# Patient Record
Sex: Female | Born: 1995
Health system: Southern US, Community
[De-identification: ages and names within clinical notes are randomized; demographics above are authoritative.]

## PROBLEM LIST (undated history)

## (undated) DIAGNOSIS — R05 Cough: Secondary | ICD-10-CM

## (undated) DIAGNOSIS — N63 Unspecified lump in unspecified breast: Secondary | ICD-10-CM

## (undated) DIAGNOSIS — G4700 Insomnia, unspecified: Secondary | ICD-10-CM

## (undated) DIAGNOSIS — R0981 Nasal congestion: Secondary | ICD-10-CM

## (undated) DIAGNOSIS — C8330 Diffuse large B-cell lymphoma, unspecified site: Principal | ICD-10-CM

## (undated) DIAGNOSIS — J029 Acute pharyngitis, unspecified: Secondary | ICD-10-CM

## (undated) HISTORY — PX: NO PAST SURGERIES: SHX2092

## (undated) HISTORY — DX: Diffuse large B-cell lymphoma, unspecified site: C83.30

## (undated) HISTORY — DX: Insomnia, unspecified: G47.00

---

## 2014-08-14 DIAGNOSIS — R059 Cough, unspecified: Secondary | ICD-10-CM

## 2014-08-14 DIAGNOSIS — N63 Unspecified lump in unspecified breast: Secondary | ICD-10-CM

## 2014-08-14 DIAGNOSIS — R0981 Nasal congestion: Secondary | ICD-10-CM

## 2014-08-14 HISTORY — DX: Unspecified lump in unspecified breast: N63.0

## 2014-08-14 HISTORY — DX: Cough, unspecified: R05.9

## 2014-08-14 HISTORY — DX: Nasal congestion: R09.81

## 2014-08-15 ENCOUNTER — Other Ambulatory Visit (HOSPITAL_COMMUNITY)
Admission: RE | Admit: 2014-08-15 | Discharge: 2014-08-15 | Disposition: A | Discharge status: Home / Self Care | Payer: Medicaid Other | Source: Ambulatory Visit | Attending: Hematology and Oncology | Admitting: Hematology and Oncology

## 2014-08-15 ENCOUNTER — Ambulatory Visit: Payer: Medicaid Other

## 2014-08-15 ENCOUNTER — Encounter: Payer: Self-pay | Admitting: Hematology and Oncology

## 2014-08-15 ENCOUNTER — Ambulatory Visit (HOSPITAL_BASED_OUTPATIENT_CLINIC_OR_DEPARTMENT_OTHER): Payer: Medicaid Other | Admitting: Hematology and Oncology

## 2014-08-15 VITALS — BP 133/68 | HR 71 | Temp 98.1°F | Resp 18 | Ht 65.0 in | Wt 205.1 lb

## 2014-08-15 DIAGNOSIS — C8299 Follicular lymphoma, unspecified, extranodal and solid organ sites: Secondary | ICD-10-CM

## 2014-08-15 DIAGNOSIS — C8590 Non-Hodgkin lymphoma, unspecified, unspecified site: Secondary | ICD-10-CM

## 2014-08-15 DIAGNOSIS — C8589 Other specified types of non-Hodgkin lymphoma, extranodal and solid organ sites: Secondary | ICD-10-CM | POA: Insufficient documentation

## 2014-08-15 LAB — CBC WITH DIFFERENTIAL/PLATELET
BASO%: 0.9 % (ref 0.0–2.0)
Basophils Absolute: 0.1 10*3/uL (ref 0.0–0.1)
EOS ABS: 0.1 10*3/uL (ref 0.0–0.5)
EOS%: 0.6 % (ref 0.0–7.0)
HCT: 35.4 % (ref 34.8–46.6)
HGB: 11.3 g/dL — ABNORMAL LOW (ref 11.6–15.9)
LYMPH%: 18.9 % (ref 14.0–49.7)
MCH: 26.7 pg (ref 25.1–34.0)
MCHC: 32 g/dL (ref 31.5–36.0)
MCV: 83.4 fL (ref 79.5–101.0)
MONO#: 0.3 10*3/uL (ref 0.1–0.9)
MONO%: 3.5 % (ref 0.0–14.0)
NEUT%: 76.1 % (ref 38.4–76.8)
NEUTROS ABS: 7.6 10*3/uL — AB (ref 1.5–6.5)
Platelets: 421 10*3/uL — ABNORMAL HIGH (ref 145–400)
RBC: 4.24 10*6/uL (ref 3.70–5.45)
RDW: 15.4 % — AB (ref 11.2–14.5)
WBC: 9.9 10*3/uL (ref 3.9–10.3)
lymph#: 1.9 10*3/uL (ref 0.9–3.3)

## 2014-08-15 LAB — COMPREHENSIVE METABOLIC PANEL (CC13)
ALT: 9 U/L (ref 0–55)
AST: 22 U/L (ref 5–34)
Albumin: 3.7 g/dL (ref 3.5–5.0)
Alkaline Phosphatase: 69 U/L (ref 40–150)
Anion Gap: 10 mEq/L (ref 3–11)
BUN: 7.4 mg/dL (ref 7.0–26.0)
CHLORIDE: 106 meq/L (ref 98–109)
CO2: 27 meq/L (ref 22–29)
Calcium: 10 mg/dL (ref 8.4–10.4)
Creatinine: 0.8 mg/dL (ref 0.6–1.1)
GLUCOSE: 77 mg/dL (ref 70–140)
POTASSIUM: 3.9 meq/L (ref 3.5–5.1)
SODIUM: 142 meq/L (ref 136–145)
Total Bilirubin: 0.29 mg/dL (ref 0.20–1.20)
Total Protein: 7 g/dL (ref 6.4–8.3)

## 2014-08-15 LAB — LACTATE DEHYDROGENASE (CC13): LDH: 606 U/L — ABNORMAL HIGH (ref 125–245)

## 2014-08-15 NOTE — Progress Notes (Signed)
Spoke w/ new pt with no insurance.  I gave pt a Medicaid and financial assistance application to fill out.  I informed pt of the [employer] and what they assist with and what is needed to apply for the grant.  Pt has my card if she wants to apply for funds and for any questions or concerns.

## 2014-08-15 NOTE — Progress Notes (Signed)
Appt made for consultation with Dr Excell Seltzer Tues Oct 6 at 230.  Pt give d/t, address and phone no.

## 2014-08-15 NOTE — Progress Notes (Signed)
MD created note from ofc visit - copy to pt, original to scan.

## 2014-08-15 NOTE — Assessment & Plan Note (Addendum)
B-cell lymphoproliferative disease: Primary pathology is suggesting that this may be a lymphoma or lymphoblastic process. Patient is completely asymptomatic and there is no other area of palpable lymphadenopathy exam. There is no hepatosplenomegaly as well. I discussed the report with pathology and they're recommending that we obtain additional tissue for further stains including flow cytometry.  I discussed with her what lymphoma means as well as the different subtypes of lymphoma but until we diagnosed her with definitive diagnosis of lymphoma I cannot discuss any treatment plans because they do vary depending on the type of lymphoma. Even though the pathology report appears to be alarming for an aggressive lymphoma, patient is completely asymptomatic.  I will call Gen. surgery to see if a biopsy can be obtained for additional testing and stains. I will obtain a PET/CT scan to evaluate if there is any other evidence of abnormality. If it does come back as lymphoma we will need a bone marrow biopsy to complete staging. Today I would obtain CBC CMP LDH SPEP hepatitis B and C. and HIV testing. 

## 2014-08-15 NOTE — Addendum Note (Signed)
Addended by: Prentiss Bells on: 08/15/2014 02:38 PM   Modules accepted: Orders

## 2014-08-15 NOTE — Progress Notes (Signed)
[facility] NOTE  Patient Care Team: Thurman Coyer, MD as PCP - General (Internal Medicine)  CHIEF COMPLAINTS/PURPOSE OF CONSULTATION:  Bilateral breast masses  HISTORY OF PRESENTING ILLNESS:  Lindsey Irwin 18 y.o. female is here because of recent diagnosis of bilateral breast masses. Patient had multiple bilateral breast masses since one year started in the left breast but over the past 6 months for the right breast gradually increasing in size mammogram done 07/30/2014 revealed multiple masses in both breasts could be juvenile fibroadenomas, pylloides tumor or malignancy. Ultrasound on 07/30/2014 revealed multiple note dated masses in the right breast and fine 15.9 cm, multiple lobate or masses left breasts. Medial quadrant 4.6 cm combined dimension as well as father located masses measuring 3.2 cm. Biopsy under ultrasound revealed high grade lymphoproliferative B-cell neoplasm but pathology is requesting additional tissue for stains,flow cytometry evaluations.  She is here accompanied by her friend. 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 She denies any pain in the breasts. She has lost 40 pounds intentionally over the past 4 years. She denies any fevers chills night sweats or any other areas of lymph node enlargements. She reports her family is from [place] and she is here by herself.  I reviewed her records extensively and collaborated the history with the patient.  MEDICAL HISTORY:  No past medical history on file.  SURGICAL HISTORY: No past surgical history on file.  SOCIAL HISTORY: History   Social History  . Marital Status: Unknown    Spouse Name: N/A    Number of Children: N/A  . Years of Education: N/A   Occupational History  . Not on file.   Social History Main Topics  . Smoking status: Not on file  . Smokeless tobacco: Not on file  . Alcohol Use: Not on file  . Drug Use: Not on file  . Sexual Activity: Not on  file   Other Topics Concern  . Not on file   Social History Narrative  . No narrative on file    FAMILY HISTORY: No family history on file.  ALLERGIES:  has no allergies on file.  MEDICATIONS:  No current outpatient prescriptions on file.   No current facility-administered medications for this visit.    REVIEW OF SYSTEMS:   Constitutional: Denies fevers, chills or abnormal night sweats Eyes: Denies blurriness of vision, double vision or watery eyes Ears, nose, mouth, throat, and face: Denies mucositis or sore throat Respiratory: Denies cough, dyspnea or wheezes Cardiovascular: Denies palpitation, chest discomfort or lower extremity swelling Gastrointestinal:  Denies nausea, heartburn or change in bowel habits Skin: Denies abnormal skin rashes Lymphatics: Denies new lymphadenopathy or easy bruising Neurological:Denies numbness, tingling or new weaknesses Behavioral/Psych: Mood is stable, no new changes  Breast: Bilateral breast masses All other systems were reviewed with the patient and are negative.  PHYSICAL EXAMINATION: ECOG PERFORMANCE STATUS: 0 - Asymptomatic  Filed Vitals:   08/15/14 1240  BP: 133/68  Pulse: 71  Temp: 98.1 F (36.7 C)  Resp: 18   Filed Weights   08/15/14 1240  Weight: 205 lb 1.6 oz (93.033 kg)    GENERAL:alert, no distress and comfortable SKIN: skin color, texture, turgor are normal, no rashes or significant lesions EYES: normal, conjunctiva are pink and non-injected, sclera clear OROPHARYNX:no exudate, no erythema and lips, buccal mucosa, and tongue normal  NECK: supple, thyroid normal size, non-tender, without nodularity LYMPH:  no palpable lymphadenopathy in the cervical, axillary or inguinal LUNGS: clear  to auscultation and percussion with normal breathing effort HEART: regular rate & rhythm and no murmurs and no lower extremity edema ABDOMEN:abdomen soft, non-tender and normal bowel sounds, no hepatosplenomegaly Musculoskeletal:no  cyanosis of digits and no clubbing  PSYCH: alert & oriented x 3 with fluent speech NEURO: no focal motor/sensory deficits BREAST: Bilateral palpable breast masses on the right is at least 12-14 cm mass on the left upper outer quadrant there are multiple small nodules one to 3 cm in dimension, there is no palpable axillary cervical inguinal lymphadenopathy  LABORATORY DATA:  Pending  RADIOGRAPHIC STUDIES: I have personally reviewed the radiological reports and agreed with the findings in the report.  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 Patient is completely asymptomatic and there is no other area of palpable lymphadenopathy exam. There is no hepatosplenomegaly as well. I discussed the report with pathology and they're recommending that we obtain additional tissue for further stains including flow cytometry.  I discussed with her what lymphoma means as well as the different subtypes of lymphoma but until we diagnosed her with definitive diagnosis of lymphoma I cannot discuss any treatment plans because they do vary depending on the type of lymphoma. Even though the pathology report appears to be alarming for an aggressive lymphoma, patient is completely asymptomatic.  I will call Gen. surgery to see if a biopsy can be obtained for additional testing and stains. I will obtain a PET/CT scan to evaluate if there is any other evidence of abnormality. If it does come back as lymphoma we will need a bone marrow biopsy to complete staging. Today I would obtain CBC CMP LDH SPEP hepatitis B and C. and HIV testing.  All questions were answered. The patient knows to call the clinic with any problems, questions or concerns. I spent 55 minutes counseling the patient face to face. The total time spent in the appointment was 60 minutes and more than 50%  was on counseling.     Rulon Eisenmenger, MD 08/15/2014 1:58 PM

## 2014-08-18 ENCOUNTER — Other Ambulatory Visit: Payer: Self-pay

## 2014-08-19 ENCOUNTER — Telehealth: Payer: Self-pay | Admitting: Hematology and Oncology

## 2014-08-19 ENCOUNTER — Encounter: Payer: Self-pay | Admitting: *Deleted

## 2014-08-19 LAB — SPEP & IFE WITH QIG
Albumin ELP: 57.4 % (ref 55.8–66.1)
Alpha-1-Globulin: 6.5 % — ABNORMAL HIGH (ref 2.9–4.9)
Alpha-2-Globulin: 13 % — ABNORMAL HIGH (ref 7.1–11.8)
BETA 2: 5.6 % (ref 3.2–6.5)
BETA GLOBULIN: 7 % (ref 4.7–7.2)
Gamma Globulin: 10.5 % — ABNORMAL LOW (ref 11.1–18.8)
IGA: 267 mg/dL (ref 69–380)
IGM, SERUM: 126 mg/dL (ref 52–322)
IgG (Immunoglobin G), Serum: 709 mg/dL (ref 690–1700)
TOTAL PROTEIN, SERUM ELECTROPHOR: 6.9 g/dL (ref 6.0–8.3)

## 2014-08-19 LAB — HIV ANTIBODY (ROUTINE TESTING W REFLEX): HIV 1&2 Ab, 4th Generation: NONREACTIVE

## 2014-08-19 LAB — HEPATITIS C ANTIBODY: HCV Ab: NEGATIVE

## 2014-08-19 LAB — HEPATITIS B SURFACE ANTIGEN: Hepatitis B Surface Ag: NEGATIVE

## 2014-08-19 NOTE — Telephone Encounter (Signed)
, °

## 2014-08-19 NOTE — Progress Notes (Signed)
Patient came to cancer center with mother stating that they went to Dr. Excell Seltzer to be seen but were told that they did not have an appt.  Appt was rescheduled on 10/9 at 11:00 with Dr. Georgette Dover per Otila Kluver. Spoke with Otila Kluver at [facility] and patient is set with appt this Friday.

## 2014-08-20 LAB — FLOW CYTOMETRY

## 2014-08-21 ENCOUNTER — Encounter (INDEPENDENT_AMBULATORY_CARE_PROVIDER_SITE_OTHER): Payer: Self-pay | Admitting: Surgery

## 2014-08-22 ENCOUNTER — Ambulatory Visit (INDEPENDENT_AMBULATORY_CARE_PROVIDER_SITE_OTHER): Payer: Self-pay | Admitting: Surgery

## 2014-08-23 ENCOUNTER — Ambulatory Visit (INDEPENDENT_AMBULATORY_CARE_PROVIDER_SITE_OTHER): Payer: Self-pay | Admitting: Surgery

## 2014-08-25 ENCOUNTER — Encounter (HOSPITAL_COMMUNITY): Payer: Self-pay

## 2014-08-25 ENCOUNTER — Ambulatory Visit (HOSPITAL_COMMUNITY)
Admission: RE | Admit: 2014-08-25 | Discharge: 2014-08-25 | Disposition: A | Discharge status: Home / Self Care | Payer: Medicaid Other | Source: Ambulatory Visit | Attending: Hematology and Oncology | Admitting: Hematology and Oncology

## 2014-08-25 ENCOUNTER — Encounter (HOSPITAL_BASED_OUTPATIENT_CLINIC_OR_DEPARTMENT_OTHER): Payer: Self-pay | Admitting: *Deleted

## 2014-08-25 DIAGNOSIS — J029 Acute pharyngitis, unspecified: Secondary | ICD-10-CM

## 2014-08-25 DIAGNOSIS — C8510 Unspecified B-cell lymphoma, unspecified site: Secondary | ICD-10-CM | POA: Diagnosis not present

## 2014-08-25 DIAGNOSIS — N63 Unspecified lump in breast: Secondary | ICD-10-CM | POA: Diagnosis present

## 2014-08-25 DIAGNOSIS — C8299 Follicular lymphoma, unspecified, extranodal and solid organ sites: Secondary | ICD-10-CM

## 2014-08-25 HISTORY — DX: Acute pharyngitis, unspecified: J02.9

## 2014-08-25 LAB — GLUCOSE, CAPILLARY: GLUCOSE-CAPILLARY: 88 mg/dL (ref 70–99)

## 2014-08-25 IMAGING — PT NM PET TUM IMG INITIAL (PI) SKULL BASE T - THIGH
1 series · 5 of 5 positions shown · non-contrast
Comparison: None

CLINICAL DATA: Initial treatment strategy for nodular lymphoma..

EXAM:
NUCLEAR MEDICINE PET SKULL BASE TO THIGH
TECHNIQUE: 10.3 mCi F-18 FDG was injected intravenously. Full-ring PET imaging
was performed from the skull base to thigh after the radiotracer. CT
data was obtained and used for attenuation correction and anatomic
localization.
FASTING BLOOD GLUCOSE:  Value: 88 mg/dl

[Series 1052: results mm oncology reading · 4.0mm · 0.89mm/px · 5 of 5 slices shown]
[im 1/5]
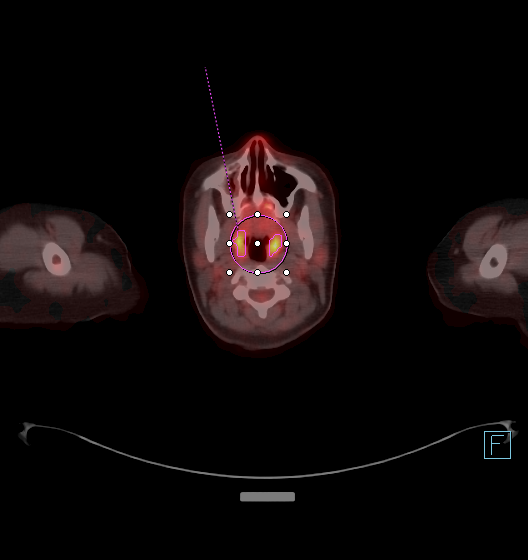
[im 2/5]
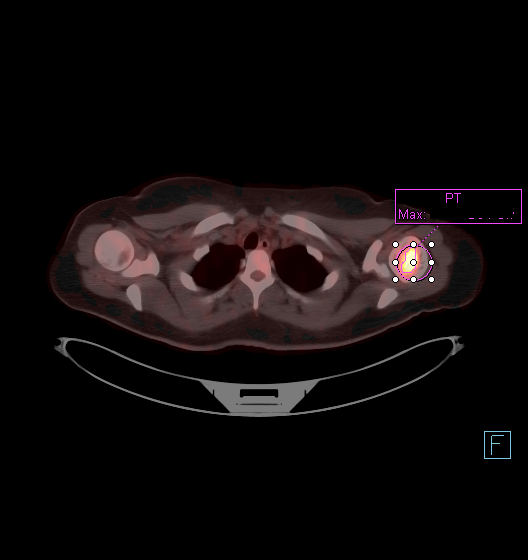
[im 3/5]
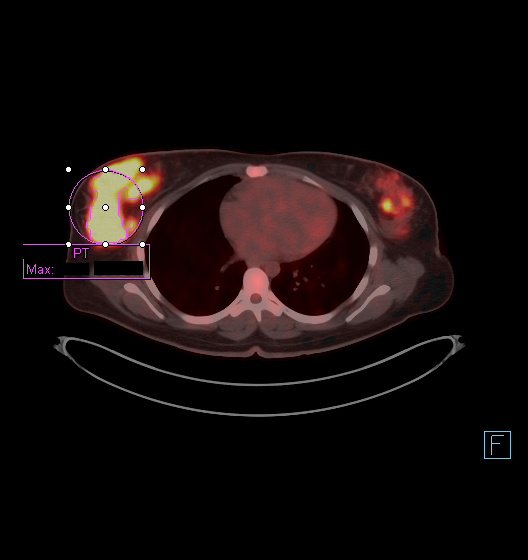
[im 4/5]
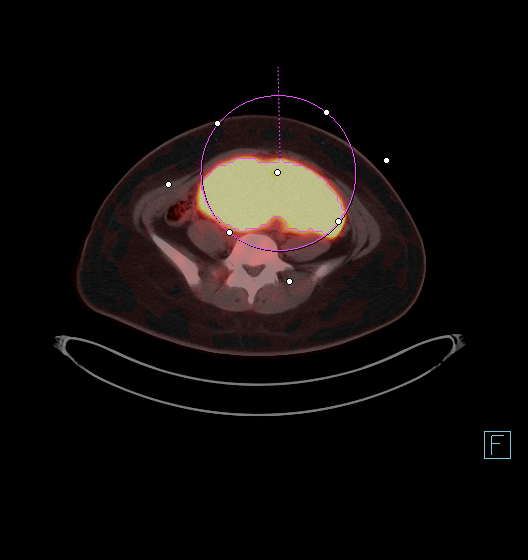
[im 5/5]
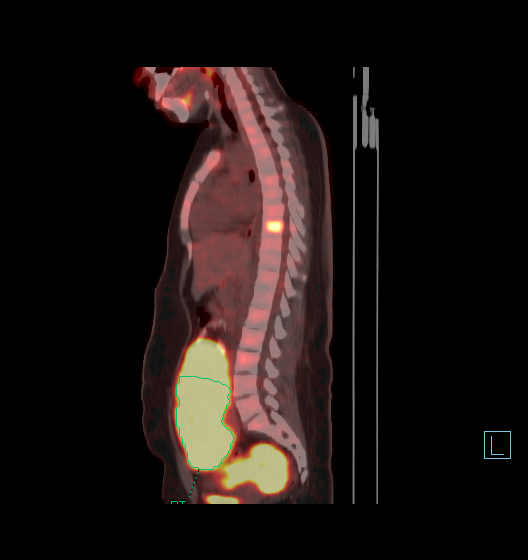

[5 of 5 positions shown; findings below may reference images not displayed]

FINDINGS: NECK

Bilateral palatine tonsil hypermetabolism which is favored to be
physiologic. No correlate CT mass. This measures a S.U.V. max of
8.8.

CHEST

Right greater the left hypermetabolic breast masses. The right-sided
mass measures a S.U.V. max of 12.9. On the order of 9.8 x 7.6 cm.
Image 72.

ABDOMEN/PELVIS

Dominant abdominal pelvic hypermetabolic mass. Superiorly, this
measures 8.9 x 15.8 Cm. Inferiorly, 9.6 x 12.4 cm. This measures a
S.U.V. max of 14.9.

SKELETON

Multi focal upper metabolic foci, consistent with lymphomatous
marrow involvement. Index left humeral head lesion measures a S.U.V.
max of 12.3. T8 hypermetabolic focus.

CT IMAGES PERFORMED FOR ATTENUATION CORRECTION

Mucosal thickening right maxillary sinus. No cervical adenopathy. No
axillary adenopathy. Small retroperitoneal nodes, without
retroperitoneal adenopathy.

No bowel obstruction. Small volume cul-de-sac fluid which could be
physiologic or related to the abdominal pelvic mass. Uterus not well
visualized.
IMPRESSION: 1. Markedly hypermetabolic active lymphoma within the breasts,
abdomen/pelvis, and marrow space.
2. Likely physiologic palatine tonsil hypermetabolism.

## 2014-08-25 MED ORDER — FLUDEOXYGLUCOSE F - 18 (FDG) INJECTION
10.2700 | Freq: Once | INTRAVENOUS | Status: AC | PRN
  Administered 2014-08-25: 10.27 via INTRAVENOUS

## 2014-08-27 ENCOUNTER — Telehealth: Payer: Self-pay

## 2014-08-27 ENCOUNTER — Telehealth: Payer: Self-pay | Admitting: *Deleted

## 2014-08-27 ENCOUNTER — Telehealth: Payer: Self-pay | Admitting: Hematology and Oncology

## 2014-08-27 ENCOUNTER — Encounter: Payer: Self-pay | Admitting: Internal Medicine

## 2014-08-27 NOTE — Telephone Encounter (Signed)
Let pt know appt rescheduled to Mon am - acknowledged appt d/t.  Pt spoke with Dr Lindi Adie.

## 2014-08-27 NOTE — Telephone Encounter (Signed)
Per staff message and POF I have scheduled appts. Advised scheduler of appts. JMW  

## 2014-08-28 ENCOUNTER — Encounter (HOSPITAL_BASED_OUTPATIENT_CLINIC_OR_DEPARTMENT_OTHER)
Admission: RE | Disposition: A | Discharge status: Home / Self Care | Payer: Self-pay | Source: Ambulatory Visit | Attending: Surgery

## 2014-08-28 ENCOUNTER — Encounter (HOSPITAL_BASED_OUTPATIENT_CLINIC_OR_DEPARTMENT_OTHER): Payer: Medicaid Other | Admitting: Anesthesiology

## 2014-08-28 ENCOUNTER — Ambulatory Visit (HOSPITAL_BASED_OUTPATIENT_CLINIC_OR_DEPARTMENT_OTHER)
Admission: RE | Admit: 2014-08-28 | Discharge: 2014-08-28 | Disposition: A | Discharge status: Home / Self Care | Payer: Medicaid Other | Source: Ambulatory Visit | Attending: Surgery | Admitting: Surgery

## 2014-08-28 ENCOUNTER — Encounter (HOSPITAL_BASED_OUTPATIENT_CLINIC_OR_DEPARTMENT_OTHER): Payer: Self-pay

## 2014-08-28 ENCOUNTER — Ambulatory Visit: Payer: Self-pay | Admitting: Hematology and Oncology

## 2014-08-28 ENCOUNTER — Other Ambulatory Visit (HOSPITAL_COMMUNITY)
Admission: RE | Admit: 2014-08-28 | Discharge: 2014-08-28 | Disposition: A | Discharge status: Home / Self Care | Payer: Medicaid Other | Source: Ambulatory Visit | Attending: Hematology and Oncology | Admitting: Hematology and Oncology

## 2014-08-28 ENCOUNTER — Ambulatory Visit (HOSPITAL_BASED_OUTPATIENT_CLINIC_OR_DEPARTMENT_OTHER): Payer: Medicaid Other | Admitting: Anesthesiology

## 2014-08-28 DIAGNOSIS — C8299 Follicular lymphoma, unspecified, extranodal and solid organ sites: Secondary | ICD-10-CM

## 2014-08-28 DIAGNOSIS — C8510 Unspecified B-cell lymphoma, unspecified site: Secondary | ICD-10-CM | POA: Insufficient documentation

## 2014-08-28 HISTORY — DX: Acute pharyngitis, unspecified: J02.9

## 2014-08-28 HISTORY — PX: BREAST BIOPSY: SHX20

## 2014-08-28 HISTORY — DX: Unspecified lump in unspecified breast: N63.0

## 2014-08-28 HISTORY — DX: Cough: R05

## 2014-08-28 HISTORY — DX: Nasal congestion: R09.81

## 2014-08-28 LAB — POCT HEMOGLOBIN-HEMACUE: Hemoglobin: 12.4 g/dL (ref 12.0–15.0)

## 2014-08-28 SURGERY — BREAST BIOPSY
Anesthesia: General | Site: Breast | Laterality: Right

## 2014-08-28 MED ORDER — MIDAZOLAM HCL 2 MG/2ML IJ SOLN: 1.0000 mg | INTRAMUSCULAR | Status: DC | PRN

## 2014-08-28 MED ORDER — HYDROCODONE-ACETAMINOPHEN 5-325 MG PO TABS: 1.0000 | ORAL_TABLET | ORAL | Status: DC | PRN

## 2014-08-28 MED ORDER — LIDOCAINE HCL (CARDIAC) 20 MG/ML IV SOLN
INTRAVENOUS | Status: DC | PRN
  Administered 2014-08-28: 100 mg via INTRAVENOUS

## 2014-08-28 MED ORDER — FENTANYL CITRATE 0.05 MG/ML IJ SOLN
INTRAMUSCULAR | Status: AC
  Filled 2014-08-28: qty 6

## 2014-08-28 MED ORDER — FENTANYL CITRATE 0.05 MG/ML IJ SOLN
INTRAMUSCULAR | Status: DC | PRN
  Administered 2014-08-28: 100 ug via INTRAVENOUS

## 2014-08-28 MED ORDER — DEXAMETHASONE SODIUM PHOSPHATE 4 MG/ML IJ SOLN
INTRAMUSCULAR | Status: DC | PRN
  Administered 2014-08-28: 10 mg via INTRAVENOUS

## 2014-08-28 MED ORDER — PROPOFOL 10 MG/ML IV BOLUS
INTRAVENOUS | Status: DC | PRN
  Administered 2014-08-28: 180 mg via INTRAVENOUS

## 2014-08-28 MED ORDER — MIDAZOLAM HCL 5 MG/5ML IJ SOLN
INTRAMUSCULAR | Status: DC | PRN
  Administered 2014-08-28: 2 mg via INTRAVENOUS

## 2014-08-28 MED ORDER — ONDANSETRON HCL 4 MG/2ML IJ SOLN: 4.0000 mg | INTRAMUSCULAR | Status: DC | PRN

## 2014-08-28 MED ORDER — MORPHINE SULFATE 2 MG/ML IJ SOLN: 2.0000 mg | INTRAMUSCULAR | Status: DC | PRN

## 2014-08-28 MED ORDER — ONDANSETRON HCL 4 MG/2ML IJ SOLN
INTRAMUSCULAR | Status: DC | PRN
  Administered 2014-08-28: 4 mg via INTRAVENOUS

## 2014-08-28 MED ORDER — PROMETHAZINE HCL 25 MG/ML IJ SOLN: 6.2500 mg | INTRAMUSCULAR | Status: DC | PRN

## 2014-08-28 MED ORDER — FENTANYL CITRATE 0.05 MG/ML IJ SOLN
50.0000 ug | INTRAMUSCULAR | Status: DC | PRN
Start: 2014-08-28 — End: 2014-08-28

## 2014-08-28 MED ORDER — CEFAZOLIN SODIUM-DEXTROSE 2-3 GM-% IV SOLR
INTRAVENOUS | Status: AC
  Filled 2014-08-28: qty 50

## 2014-08-28 MED ORDER — CHLORHEXIDINE GLUCONATE 4 % EX LIQD: 1.0000 "application " | Freq: Once | CUTANEOUS | Status: DC

## 2014-08-28 MED ORDER — LACTATED RINGERS IV SOLN
INTRAVENOUS | Status: DC
  Administered 2014-08-28: 09:00:00 via INTRAVENOUS

## 2014-08-28 MED ORDER — HYDROMORPHONE HCL 1 MG/ML IJ SOLN: 0.2500 mg | INTRAMUSCULAR | Status: DC | PRN

## 2014-08-28 MED ORDER — MIDAZOLAM HCL 2 MG/ML PO SYRP: 12.0000 mg | ORAL_SOLUTION | Freq: Once | ORAL | Status: DC | PRN

## 2014-08-28 MED ORDER — OXYCODONE HCL 5 MG/5ML PO SOLN: 5.0000 mg | Freq: Once | ORAL | Status: DC | PRN

## 2014-08-28 MED ORDER — BUPIVACAINE-EPINEPHRINE 0.25% -1:200000 IJ SOLN
INTRAMUSCULAR | Status: DC | PRN
  Administered 2014-08-28: 10 mL

## 2014-08-28 MED ORDER — OXYCODONE HCL 5 MG PO TABS: 5.0000 mg | ORAL_TABLET | Freq: Once | ORAL | Status: DC | PRN

## 2014-08-28 MED ORDER — MIDAZOLAM HCL 2 MG/2ML IJ SOLN
INTRAMUSCULAR | Status: AC
  Filled 2014-08-28: qty 2

## 2014-08-28 MED ORDER — CEFAZOLIN SODIUM-DEXTROSE 2-3 GM-% IV SOLR
2.0000 g | INTRAVENOUS | Status: AC
  Administered 2014-08-28: 2 g via INTRAVENOUS

## 2014-08-28 MED ORDER — BUPIVACAINE-EPINEPHRINE (PF) 0.5% -1:200000 IJ SOLN
INTRAMUSCULAR | Status: AC
  Filled 2014-08-28: qty 30

## 2014-08-28 SURGICAL SUPPLY — 44 items
BENZOIN TINCTURE PRP APPL 2/3 (GAUZE/BANDAGES/DRESSINGS) ×3 IMPLANT
BLADE HEX COATED 2.75 (ELECTRODE) ×3 IMPLANT
BLADE SURG 15 STRL LF DISP TIS (BLADE) ×1 IMPLANT
BLADE SURG 15 STRL SS (BLADE) ×2
CANISTER SUCT 1200ML W/VALVE (MISCELLANEOUS) IMPLANT
CHLORAPREP W/TINT 26ML (MISCELLANEOUS) ×3 IMPLANT
CLOSURE WOUND 1/2 X4 (GAUZE/BANDAGES/DRESSINGS) ×1
COVER BACK TABLE 60X90IN (DRAPES) ×3 IMPLANT
COVER MAYO STAND STRL (DRAPES) ×3 IMPLANT
DECANTER SPIKE VIAL GLASS SM (MISCELLANEOUS) IMPLANT
DEVICE DUBIN W/COMP PLATE 8390 (MISCELLANEOUS) IMPLANT
DRAPE PED LAPAROTOMY (DRAPES) ×3 IMPLANT
DRAPE UTILITY XL STRL (DRAPES) ×3 IMPLANT
DRSG TEGADERM 4X4.75 (GAUZE/BANDAGES/DRESSINGS) ×3 IMPLANT
ELECT REM PT RETURN 9FT ADLT (ELECTROSURGICAL) ×3
ELECTRODE REM PT RTRN 9FT ADLT (ELECTROSURGICAL) ×1 IMPLANT
GLOVE BIO SURGEON STRL SZ7 (GLOVE) ×3 IMPLANT
GLOVE BIOGEL PI IND STRL 7.0 (GLOVE) ×2 IMPLANT
GLOVE BIOGEL PI IND STRL 7.5 (GLOVE) ×1 IMPLANT
GLOVE BIOGEL PI INDICATOR 7.0 (GLOVE) ×4
GLOVE BIOGEL PI INDICATOR 7.5 (GLOVE) ×2
GLOVE ECLIPSE 7.0 STRL STRAW (GLOVE) ×3 IMPLANT
GOWN STRL REUS W/ TWL LRG LVL3 (GOWN DISPOSABLE) ×2 IMPLANT
GOWN STRL REUS W/TWL LRG LVL3 (GOWN DISPOSABLE) ×4
KIT MARKER MARGIN INK (KITS) IMPLANT
NEEDLE HYPO 25X1 1.5 SAFETY (NEEDLE) ×3 IMPLANT
NS IRRIG 1000ML POUR BTL (IV SOLUTION) ×3 IMPLANT
PACK BASIN DAY SURGERY FS (CUSTOM PROCEDURE TRAY) ×3 IMPLANT
PENCIL BUTTON HOLSTER BLD 10FT (ELECTRODE) ×3 IMPLANT
SLEEVE SCD COMPRESS KNEE MED (MISCELLANEOUS) ×3 IMPLANT
SPONGE GAUZE 4X4 12PLY STER LF (GAUZE/BANDAGES/DRESSINGS) ×3 IMPLANT
SPONGE LAP 4X18 X RAY DECT (DISPOSABLE) IMPLANT
STRIP CLOSURE SKIN 1/2X4 (GAUZE/BANDAGES/DRESSINGS) ×2 IMPLANT
SUT CHROMIC 3 0 SH 27 (SUTURE) IMPLANT
SUT MON AB 4-0 PC3 18 (SUTURE) ×3 IMPLANT
SUT SILK 2 0 SH (SUTURE) IMPLANT
SUT VIC AB 3-0 SH 27 (SUTURE) ×2
SUT VIC AB 3-0 SH 27X BRD (SUTURE) ×1 IMPLANT
SYR CONTROL 10ML LL (SYRINGE) ×3 IMPLANT
TOWEL OR 17X24 6PK STRL BLUE (TOWEL DISPOSABLE) ×3 IMPLANT
TOWEL OR NON WOVEN STRL DISP B (DISPOSABLE) ×3 IMPLANT
TUBE CONNECTING 20'X1/4 (TUBING)
TUBE CONNECTING 20X1/4 (TUBING) IMPLANT
YANKAUER SUCT BULB TIP NO VENT (SUCTIONS) IMPLANT

## 2014-08-28 NOTE — Anesthesia Postprocedure Evaluation (Signed)
  Anesthesia Post-op Note  Patient: Lindsey Irwin  Procedure(s) Performed: Procedure(s): RIGHT EXCISIONAL BREAST BIOPSY (Right)  Patient Location: PACU  Anesthesia Type:General  Level of Consciousness: awake, alert  and oriented  Airway and Oxygen Therapy: Patient Spontanous Breathing  Post-op Pain: none  Post-op Assessment: Post-op Vital signs reviewed  Post-op Vital Signs: Reviewed  Last Vitals:  Filed Vitals:   08/28/14 1050  BP: 118/64  Pulse: 54  Temp: 36.9 C  Resp: 18    Complications: No apparent anesthesia complications

## 2014-08-28 NOTE — Transfer of Care (Signed)
Immediate Anesthesia Transfer of Care Note  Patient: Lindsey Irwin  Procedure(s) Performed: Procedure(s): RIGHT EXCISIONAL BREAST BIOPSY (Right)  Patient Location: PACU  Anesthesia Type:General  Level of Consciousness: sedated  Airway & Oxygen Therapy: Patient Spontanous Breathing and Patient connected to face mask oxygen  Post-op Assessment: Report given to PACU RN and Post -op Vital signs reviewed and stable  Post vital signs: Reviewed and stable  Complications: No apparent anesthesia complications

## 2014-08-28 NOTE — H&P (Signed)
History of Present Illness Lindsey Irwin. Ndea Kilroy MD; 08/22/2014 12:34 PM) Patient words: Rt. Breast check.  The patient is a 18 year old female who presents with a breast mass. This is a 18 yo female in good health who presents with a one-year history of palpable left breast masses. About six months ago, she began noticing palpable masses in the right breast. The right breast masses have enlarged significantly and are easily palpable. On 07/30/14, she underwent mammogram and ultrasound at [facility]. The mammogram showed multiple bilateral masses, consistent with juvenile fibroadenomas. The ultrasound showed multiple lobulated masses in each breast, measuring a combined dimension of 15.9 cm on the right and 4.6 cm and 3.2 cm on the left. Ultrasound guided core biopsy showed probable high-grade lymphoproliferative B-cell neoplasm. More tissue is requested for further testing. She is referred to discuss excisional biopsy.   Other Problems Alexander Bergeron [place], [place]; 08/22/2014 11:37 AM) No pertinent past medical history  Past Surgical History Alexander Bergeron [place], [place]; 08/22/2014 11:37 AM) Breast Biopsy Right.  Diagnostic Studies History Alexander Bergeron [place], [place]; 08/22/2014 11:37 AM) Colonoscopy never Mammogram within last year Pap Smear never  Allergies [facility] [place], RMA; 08/22/2014 11:40 AM) No Known Drug Allergies10/07/2014  Medication History (Dahionnarah [place], RMA; 08/22/2014 11:41 AM) DayQuil (Oral) Active. NyQuil Cold & Flu (15-6.25-325MG /15ML Liquid, Oral) Active.  Social History (Dahionnarah [place], [place]; 08/22/2014 11:37 AM) Caffeine use Carbonated beverages, Tea. No alcohol use No drug use Tobacco use Never smoker.  Family History ([place], [place]; 08/22/2014 11:37 AM) Family history unknown First Degree Relatives  Pregnancy / Birth History Alexander Bergeron [place], [place]; 08/22/2014 11:37 AM) Age at menarche 34 years. Gravida 0 Para  0 Regular periods  Review of Systems ([place] RMA; 08/22/2014 11:37 AM) General Not Present- Appetite Loss, Chills, Fatigue, Fever, Night Sweats, Weight Gain and Weight Loss. Skin Not Present- Change in Wart/Mole, Dryness, Hives, Jaundice, New Lesions, Non-Healing Wounds, Rash and Ulcer. HEENT Not Present- Earache, Hearing Loss, Hoarseness, Nose Bleed, Oral Ulcers, Ringing in the Ears, Seasonal Allergies, Sinus Pain, Sore Throat, Visual Disturbances, Wears glasses/contact lenses and Yellow Eyes. Respiratory Not Present- Bloody sputum, Chronic Cough, Difficulty Breathing, Snoring and Wheezing. Breast Present- Breast Mass. Not Present- Breast Pain, Nipple Discharge and Skin Changes. Cardiovascular Not Present- Chest Pain, Difficulty Breathing Lying Down, Leg Cramps, Palpitations, Rapid Heart Rate, Shortness of Breath and Swelling of Extremities. Gastrointestinal Not Present- Abdominal Pain, Bloating, Bloody Stool, Change in Bowel Habits, Chronic diarrhea, Constipation, Difficulty Swallowing, Excessive gas, Gets full quickly at meals, Hemorrhoids, Indigestion, Nausea, Rectal Pain and Vomiting. Female Genitourinary Not Present- Frequency, Nocturia, Painful Urination, Pelvic Pain and Urgency. Musculoskeletal Not Present- Back Pain, Joint Pain, Joint Stiffness, Muscle Pain, Muscle Weakness and Swelling of Extremities. Neurological Not Present- Decreased Memory, Fainting, Headaches, Numbness, Seizures, Tingling, Tremor, Trouble walking and Weakness. Psychiatric Not Present- Anxiety, Bipolar, Change in Sleep Pattern, Depression, Fearful and Frequent crying. Endocrine Not Present- Cold Intolerance, Excessive Hunger, Hair Changes, Heat Intolerance, Hot flashes and New Diabetes. Hematology Not Present- Easy Bruising, Excessive bleeding, Gland problems, HIV and Persistent Infections.   Vitals (Dahionnarah Maldonado RMA; 08/22/2014 11:40 AM) 08/22/2014 11:37 AM Weight: 207.2 lb Height:  66in Body Surface Area: 2.09 m Body Mass Index: 33.44 kg/m Temp.: 98.48F(Oral)  Pulse: 76 (Regular)  P.OX: 99% (Room air) BP: 130/74 (Sitting, Right Arm, Standard)    Physical Exam Rodman Key K. Kristopher Attwood MD; 08/22/2014 12:35 PM) The physical exam findings are as follows: Note:WDWN in NAD HEENT: EOMI, sclera anicteric Neck: No masses, no thyromegaly Lungs: CTA  bilaterally; normal respiratory effort Breasts: symmetric; no nipple retraction or discharge; no skin changes Large bilateral firm palpable breast masses. The largest in in the upper outer quadrant of the right breast. Part of this lies just deep to the edge of the areola. CV: Regular rate and rhythm; no murmurs Abd: +bowel sounds, soft, non-tender, no masses Ext: Well-perfused; no edema Skin: Warm, dry; no sign of jaundice    Assessment & Plan Rodman Key K. Nitza Schmid MD; 08/22/2014 12:35 PM) LYMPHOMA OF BREAST (202.80  C85.89) Current Plans  Schedule for Surgery Note:Excisional biopsy of the right breast to provide adequate tissue for testing. The surgical procedure has been discussed with the patient. Potential risks, benefits, alternative treatments, and expected outcomes have been explained. All of the patient's questions at this time have been answered. The likelihood of reaching the patient's treatment goal is good. The patient understand the proposed surgical procedure and wishes to proceed.   Lindsey Irwin. Georgette Dover, MD, [facility] Surgery  General/ Trauma Surgery  08/28/2014 7:12 AM

## 2014-08-28 NOTE — Anesthesia Procedure Notes (Signed)
Procedure Name: LMA Insertion Performed by: Bennye Nix, [place] Pre-anesthesia Checklist: Patient identified, Emergency Drugs available, Suction available and Patient being monitored Patient Re-evaluated:Patient Re-evaluated prior to inductionOxygen Delivery Method: Circle System Utilized Preoxygenation: Pre-oxygenation with 100% oxygen Intubation Type: IV induction Ventilation: Mask ventilation without difficulty LMA: LMA inserted LMA Size: 4.0 Number of attempts: 1 Airway Equipment and Method: bite block Placement Confirmation: positive ETCO2 Tube secured with: Tape Dental Injury: Teeth and Oropharynx as per pre-operative assessment      

## 2014-08-28 NOTE — Op Note (Signed)
Pre-op Diagnosis:  Bilateral breast masses Post-op Diagnosis:  Same Procedure:  Right excisional breast biopsy Surgeon:  Shahd Occhipinti K. Anesthesia:  GEN - LMA Indications:  18 yo female in good health presents with enlarging bilateral breast masses.  Biopsy showed high-grade lymphoproliferative B-cell neoplasm, but additional tissue is requested for further testing.  I decided to excise part of one of the right breast masses, since it was located behind the areola.  Description of procedure:  The patient was brought to the operating room and placed in a supine position on the table.  After an adequate level of anesthesia was obtained, we prepped the right breast with ChloraPrep and draped in sterile fashion.  A time out was taken.  I infiltrated the area around the right nipple with 0.25% Marcaine with epinephrine.  A circumareolar incision was made.  We dissected down to the mass with cautery.  I excised an area of tissue measuring about 1.5 x 2 x 2 cm.  This was sent for pathology as a fresh specimen for lymphoma protocol.  The wound was inspected carefully for hemostasis, and then closed with 3-0 Vicryl and 4-0 Monocryl.  Steristrips and clean dressings were applied.  She was extubated and brought to the recovery room in stable condition.  All counts were correct.  Imogene Burn. Georgette Dover, MD, [facility] Surgery  General/ Trauma Surgery  08/28/2014 10:05 AM

## 2014-08-28 NOTE — Anesthesia Preprocedure Evaluation (Addendum)
Anesthesia Evaluation  Patient identified by MRN, date of birth, ID band Patient awake    Reviewed: Allergy & Precautions, H&P , NPO status , Patient's Chart, lab work & pertinent test results  Airway Mallampati: I TM Distance: >3 FB Neck ROM: Full    Dental  (+) Teeth Intact, Dental Advisory Given   Pulmonary neg pulmonary ROS,  breath sounds clear to auscultation        Cardiovascular negative cardio ROS  Rhythm:Regular Rate:Normal     Neuro/Psych negative neurological ROS  negative psych ROS   GI/Hepatic negative GI ROS, Neg liver ROS,   Endo/Other    Renal/GU negative Renal ROS     Musculoskeletal negative musculoskeletal ROS (+)   Abdominal   Peds  Hematology negative hematology ROS (+)   Anesthesia Other Findings   Reproductive/Obstetrics negative OB ROS                          Anesthesia Physical Anesthesia Plan  ASA: I  Anesthesia Plan: General   Post-op Pain Management:    Induction: Intravenous  Airway Management Planned: LMA  Additional Equipment:   Intra-op Plan:   Post-operative Plan:   Informed Consent: I have reviewed the patients History and Physical, chart, labs and discussed the procedure including the risks, benefits and alternatives for the proposed anesthesia with the patient or authorized representative who has indicated his/her understanding and acceptance.   Dental advisory given  Plan Discussed with: CRNA and Surgeon  Anesthesia Plan Comments:         Anesthesia Quick Evaluation

## 2014-08-28 NOTE — Discharge Instructions (Signed)
[facility] °Office Phone Number [phone] ° °BREAST BIOPSY/ PARTIAL MASTECTOMY: POST OP INSTRUCTIONS ° °Always review your discharge instruction sheet given to you by the facility where your surgery was performed. ° °IF YOU HAVE DISABILITY OR FAMILY LEAVE FORMS, YOU MUST BRING THEM TO THE OFFICE FOR PROCESSING.  DO NOT GIVE THEM TO YOUR DOCTOR. ° °1. A prescription for pain medication may be given to you upon discharge.  Take your pain medication as prescribed, if needed.  If narcotic pain medicine is not needed, then you may take acetaminophen (Tylenol) or ibuprofen (Advil) as needed. °2. Take your usually prescribed medications unless otherwise directed °3. If you need a refill on your pain medication, please contact your pharmacy.  They will contact our office to request authorization.  Prescriptions will not be filled after 5pm or on week-ends. °4. You should eat very light the first 24 hours after surgery, such as soup, crackers, pudding, etc.  Resume your normal diet the day after surgery. °5. Most patients will experience some swelling and bruising in the breast.  Ice packs and a good support bra will help.  Swelling and bruising can take several days to resolve.  °6. It is common to experience some constipation if taking pain medication after surgery.  Increasing fluid intake and taking a stool softener will usually help or prevent this problem from occurring.  A mild laxative (Milk of Magnesia or Miralax) should be taken according to package directions if there are no bowel movements after 48 hours. °7. Unless discharge instructions indicate otherwise, you may remove your bandages 48 hours after surgery, and you may shower at that time.  You will have steri-strips (small skin tapes) in place directly over the incision.  These strips should be left on the skin for 7-10 days.   Any sutures or staples will be removed at the office during your follow-up visit. °8. ACTIVITIES:  You may resume  regular daily activities (gradually increasing) beginning the next day.  Wearing a good support bra or sports bra minimizes pain and swelling.  You may have sexual intercourse when it is comfortable. °a. You may drive when you no longer are taking prescription pain medication, you can comfortably wear a seatbelt, and you can safely maneuver your car and apply brakes. °b. RETURN TO WORK:  1-2 weeks °9. You should see your doctor in the office for a follow-up appointment approximately two weeks after your surgery.  Your doctor’s nurse will typically make your follow-up appointment when she calls you with your pathology report.  Expect your pathology report 2-3 business days after your surgery.  You may call to check if you do not hear from us after three days. °10. OTHER INSTRUCTIONS: _______________________________________________________________________________________________ _____________________________________________________________________________________________________________________________________ °_____________________________________________________________________________________________________________________________________ °_____________________________________________________________________________________________________________________________________ ° °WHEN TO CALL YOUR DOCTOR: °1. Fever over 101.0 °2. Nausea and/or vomiting. °3. Extreme swelling or bruising. °4. Continued bleeding from incision. °5. Increased pain, redness, or drainage from the incision. ° °The clinic staff is available to answer your questions during regular business hours.  Please don’t hesitate to call and ask to speak to one of the nurses for clinical concerns.  If you have a medical emergency, go to the nearest emergency room or call 911.  A surgeon from [facility] is always on call at the hospital. ° °For further questions, please visit [email or web address]  ° ° °Post Anesthesia Home Care  Instructions ° °Activity: °Get plenty of rest for the remainder of the day. A responsible adult should stay with   you for 24 hours following the procedure.  °For the next 24 hours, DO NOT: °-Drive a car °-Operate machinery °-Drink alcoholic beverages °-Take any medication unless instructed by your physician °-Make any legal decisions or sign important papers. ° °Meals: °Start with liquid foods such as gelatin or soup. Progress to regular foods as tolerated. Avoid greasy, spicy, heavy foods. If nausea and/or vomiting occur, drink only clear liquids until the nausea and/or vomiting subsides. Call your physician if vomiting continues. ° °Special Instructions/Symptoms: °Your throat may feel dry or sore from the anesthesia or the breathing tube placed in your throat during surgery. If this causes discomfort, gargle with warm salt water. The discomfort should disappear within 24 hours. ° °

## 2014-08-29 ENCOUNTER — Telehealth: Payer: Self-pay | Admitting: *Deleted

## 2014-08-29 NOTE — Telephone Encounter (Signed)
Pt called earlier and wanted to know if she should have sedation for her procedure on Monday. Pt will be having a Bone Marrow biopsy. Pt expressed she is feeling nervous. I reassured pt that she will have some medicine to numb the area so she should not feel pain. I also reassured her his nurse will be there as well to help her get through the procedure. Message to be forwarded to Dr. Lindi Adie.

## 2014-09-01 ENCOUNTER — Other Ambulatory Visit: Payer: Medicaid Other

## 2014-09-01 ENCOUNTER — Encounter (HOSPITAL_BASED_OUTPATIENT_CLINIC_OR_DEPARTMENT_OTHER): Payer: Self-pay | Admitting: Surgery

## 2014-09-02 ENCOUNTER — Other Ambulatory Visit: Payer: Self-pay | Admitting: Hematology and Oncology

## 2014-09-02 ENCOUNTER — Encounter: Payer: Self-pay | Admitting: Hematology and Oncology

## 2014-09-02 ENCOUNTER — Telehealth: Payer: Self-pay | Admitting: Hematology and Oncology

## 2014-09-02 DIAGNOSIS — C8330 Diffuse large B-cell lymphoma, unspecified site: Secondary | ICD-10-CM

## 2014-09-02 DIAGNOSIS — Z8572 Personal history of non-Hodgkin lymphomas: Secondary | ICD-10-CM | POA: Insufficient documentation

## 2014-09-02 HISTORY — DX: Diffuse large B-cell lymphoma, unspecified site: C83.30

## 2014-09-02 NOTE — Telephone Encounter (Signed)
S/W PATIENT AND GAVE APPT FOR 10/21 @ 11:15 LABS, 11:30 W/DR. Keo.

## 2014-09-03 ENCOUNTER — Other Ambulatory Visit (HOSPITAL_BASED_OUTPATIENT_CLINIC_OR_DEPARTMENT_OTHER): Payer: Medicaid Other

## 2014-09-03 ENCOUNTER — Encounter: Payer: Self-pay | Admitting: Hematology and Oncology

## 2014-09-03 ENCOUNTER — Ambulatory Visit (HOSPITAL_BASED_OUTPATIENT_CLINIC_OR_DEPARTMENT_OTHER): Payer: Medicaid Other | Admitting: Hematology and Oncology

## 2014-09-03 VITALS — BP 121/53 | HR 62 | Temp 98.5°F | Resp 18 | Ht 66.0 in | Wt 202.6 lb

## 2014-09-03 DIAGNOSIS — C8330 Diffuse large B-cell lymphoma, unspecified site: Secondary | ICD-10-CM

## 2014-09-03 LAB — CBC WITH DIFFERENTIAL/PLATELET
BASO%: 0.4 % (ref 0.0–2.0)
Basophils Absolute: 0 10*3/uL (ref 0.0–0.1)
EOS ABS: 0.1 10*3/uL (ref 0.0–0.5)
EOS%: 1.1 % (ref 0.0–7.0)
HCT: 38.1 % (ref 34.8–46.6)
HGB: 12.3 g/dL (ref 11.6–15.9)
LYMPH%: 22.2 % (ref 14.0–49.7)
MCH: 27.3 pg (ref 25.1–34.0)
MCHC: 32.3 g/dL (ref 31.5–36.0)
MCV: 84.5 fL (ref 79.5–101.0)
MONO#: 0.3 10*3/uL (ref 0.1–0.9)
MONO%: 3.1 % (ref 0.0–14.0)
NEUT%: 73.2 % (ref 38.4–76.8)
NEUTROS ABS: 5.8 10*3/uL (ref 1.5–6.5)
PLATELETS: 374 10*3/uL (ref 145–400)
RBC: 4.51 10*6/uL (ref 3.70–5.45)
RDW: 14.5 % (ref 11.2–14.5)
WBC: 8 10*3/uL (ref 3.9–10.3)
lymph#: 1.8 10*3/uL (ref 0.9–3.3)

## 2014-09-03 LAB — COMPREHENSIVE METABOLIC PANEL (CC13)
ALBUMIN: 3.9 g/dL (ref 3.5–5.0)
ALT: 8 U/L (ref 0–55)
AST: 13 U/L (ref 5–34)
Alkaline Phosphatase: 65 U/L (ref 40–150)
Anion Gap: 8 mEq/L (ref 3–11)
BUN: 12.8 mg/dL (ref 7.0–26.0)
CO2: 27 mEq/L (ref 22–29)
Calcium: 10.1 mg/dL (ref 8.4–10.4)
Chloride: 106 mEq/L (ref 98–109)
Creatinine: 0.8 mg/dL (ref 0.6–1.1)
Glucose: 82 mg/dl (ref 70–140)
POTASSIUM: 4.1 meq/L (ref 3.5–5.1)
Sodium: 141 mEq/L (ref 136–145)
Total Bilirubin: 0.21 mg/dL (ref 0.20–1.20)
Total Protein: 7 g/dL (ref 6.4–8.3)

## 2014-09-03 LAB — HEPATITIS B SURFACE ANTIBODY,QUALITATIVE: HEP B S AB: NEGATIVE

## 2014-09-03 LAB — HEPATITIS B CORE ANTIBODY, IGM: Hep B C IgM: NONREACTIVE

## 2014-09-03 LAB — HEPATITIS B SURFACE ANTIGEN: Hepatitis B Surface Ag: NEGATIVE

## 2014-09-03 LAB — URIC ACID (CC13): URIC ACID, SERUM: 6.8 mg/dL (ref 2.6–7.4)

## 2014-09-03 LAB — LACTATE DEHYDROGENASE (CC13): LDH: 301 U/L — ABNORMAL HIGH (ref 125–245)

## 2014-09-03 NOTE — Progress Notes (Signed)
[facility] progress notes  Patient Care Team: No Pcp Per Patient as PCP - General (General Practice)  CHIEF COMPLAINTS/PURPOSE OF VISIT:  Diffuse large B-cell lymphoma  HISTORY OF PRESENTING ILLNESS:  Lindsey Irwin 18 y.o. female was transferred to my care per request from her recent oncologist. I reviewed the patient's records extensive and collaborated the history with the patient. Summary of her history is as follows: Oncology History   Diffuse large B cell lymphoma   Primary site: Lymphoid Neoplasms   Staging method: AJCC 6th Edition   Clinical: Stage IV signed by Heath Lark, MD on 09/03/2014  8:18 PM   Summary: Stage IV IPI of 3: high LDH, >2 extranodal disease and stage IV at presentation       Diffuse large B cell lymphoma   07/30/2014 Imaging She had a bilateral mammogram which came back abnormal. Ultrasound-guided biopsy was performed, suspicious for lymphoma   08/25/2014 Imaging PET/CT scan showed diffuse metastatic cancer involving bilateral breasts, bone, and large abdominal mass   08/28/2014 Surgery She had excisional surgery and removal of right breast mass   08/28/2014 Pathology Results Accession: [id number] right breast mass is consistent with diffuse large B-cell lymphoma with high proliferation index.   According to her, she has self palpated a breast mass since November of last year that is getting progressively worse. She has anorexia and 45 pound weight loss which she claimed was intentional. She has a complaint of intermittent bone pain over the past few months.  MEDICAL HISTORY:  Past Medical History  Diagnosis Date  . Breast mass 08/2014  . Sore throat 08/25/2014  . Cough 08/2014  . Nasal congestion 08/2014  . Diffuse large B cell lymphoma 09/02/2014    SURGICAL HISTORY: Past Surgical History  Procedure Laterality Date  . No past surgeries    . Breast biopsy Right 08/28/2014    Procedure: RIGHT EXCISIONAL BREAST BIOPSY;   Surgeon: Donnie Mesa, MD;  Location: [facility];  Service: General;  Laterality: Right;    SOCIAL HISTORY: History   Social History  . Marital Status: Unknown    Spouse Name: N/A    Number of Children: N/A  . Years of Education: N/A   Occupational History  . Not on file.   Social History Main Topics  . Smoking status: Never Smoker   . Smokeless tobacco: Never Used  . Alcohol Use: No  . Drug Use: No  . Sexual Activity: Not on file   Other Topics Concern  . Not on file   Social History Narrative  . No narrative on file    FAMILY HISTORY: History reviewed. No pertinent family history.  ALLERGIES:  is allergic to soap.  MEDICATIONS:  Current Outpatient Prescriptions  Medication Sig Dispense Refill  . HYDROcodone-acetaminophen (NORCO/VICODIN) 5-325 MG per tablet Take 1 tablet by mouth every 4 (four) hours as needed.  40 tablet  0   No current facility-administered medications for this visit.    REVIEW OF SYSTEMS:   Constitutional: Denies fevers, chills or abnormal night sweats Eyes: Denies blurriness of vision, double vision or watery eyes Ears, nose, mouth, throat, and face: Denies mucositis or sore throat Respiratory: Denies cough, dyspnea or wheezes Cardiovascular: Denies palpitation, chest discomfort or lower extremity swelling Gastrointestinal:  Denies nausea, heartburn or change in bowel habits Skin: Denies abnormal skin rashes Lymphatics: Denies new lymphadenopathy or easy bruising Neurological:Denies numbness, tingling or new weaknesses Behavioral/Psych: Mood is stable, no new changes  All other  systems were reviewed with the patient and are negative.  PHYSICAL EXAMINATION: ECOG PERFORMANCE STATUS: 0 - Asymptomatic  Filed Vitals:   09/03/14 1217  BP: 121/53  Pulse: 62  Temp: 98.5 F (36.9 C)  Resp: 18   Filed Weights   09/03/14 1217  Weight: 202 lb 9.6 oz (91.899 kg)    GENERAL:alert, no distress and comfortable but she is  morbidly obese SKIN: skin color, texture, turgor are normal, no rashes or significant lesions EYES: normal, conjunctiva are pink and non-injected, sclera clear OROPHARYNX:no exudate, normal lips, buccal mucosa, and tongue  NECK: supple, thyroid normal size, non-tender, without nodularity LYMPH:  no palpable lymphadenopathy in the cervical, axillary or inguinal LUNGS: clear to auscultation and percussion with normal breathing effort HEART: regular rate & rhythm and no murmurs without lower extremity edema ABDOMEN:abdomen soft, non-tender and normal bowel sounds. I palpated lower abdominal mass which I believe probably arising from the uterus Musculoskeletal:no cyanosis of digits and no clubbing  PSYCH: alert & oriented x 3 with fluent speech NEURO: no focal motor/sensory deficits Bilateral breast examination were performed and I can feel significant nodular masses in the right breast LABORATORY DATA:  I have reviewed the data as listed Lab Results  Component Value Date   WBC 8.0 09/03/2014   HGB 12.3 09/03/2014   HCT 38.1 09/03/2014   MCV 84.5 09/03/2014   PLT 374 09/03/2014    Recent Labs  08/15/14 1418 09/03/14 1135  NA 142 141  K 3.9 4.1  CO2 27 27  GLUCOSE 77 82  BUN 7.4 12.8  CREATININE 0.8 0.8  CALCIUM 10.0 10.1  PROT 7.0 7.0  ALBUMIN 3.7 3.9  AST 22 13  ALT 9 8  ALKPHOS 69 65  BILITOT 0.29 0.21    RADIOGRAPHIC STUDIES: I have personally reviewed the radiological images as listed and agreed with the findings in the report. Nm Pet Image Initial (pi) Skull Base To Thigh  08/25/2014   CLINICAL DATA:  Initial treatment strategy for nodular lymphoma.  EXAM: NUCLEAR MEDICINE PET SKULL BASE TO THIGH  TECHNIQUE: 10.3 mCi F-18 FDG was injected intravenously. Full-ring PET imaging was performed from the skull base to thigh after the radiotracer. CT data was obtained and used for attenuation correction and anatomic localization.  FASTING BLOOD GLUCOSE:  Value: 88 mg/dl   COMPARISON:  None  FINDINGS: NECK  Bilateral palatine tonsil hypermetabolism which is favored to be physiologic. No correlate CT mass. This measures a S.U.V. max of 8.8.  CHEST  Right greater the left hypermetabolic breast masses. The right-sided mass measures a S.U.V. max of 12.9. On the order of 9.8 x 7.6 cm. Image 72.  ABDOMEN/PELVIS  Dominant abdominal pelvic hypermetabolic mass. Superiorly, this measures 8.9 x 15.8 Cm. Inferiorly, 9.6 x 12.4 cm. This measures a S.U.V. max of 14.9.  SKELETON  Multi focal upper metabolic foci, consistent with lymphomatous marrow involvement. Index left humeral head lesion measures a S.U.V. max of 12.3. T8 hypermetabolic focus.  CT IMAGES PERFORMED FOR ATTENUATION CORRECTION  Mucosal thickening right maxillary sinus. No cervical adenopathy. No axillary adenopathy. Small retroperitoneal nodes, without retroperitoneal adenopathy.  No bowel obstruction. Small volume cul-de-sac fluid which could be physiologic or related to the abdominal pelvic mass. Uterus not well visualized.  IMPRESSION: 1. Markedly hypermetabolic active lymphoma within the breasts, abdomen/pelvis, and marrow space. 2. Likely physiologic palatine tonsil hypermetabolism.   Electronically Signed   By: Abigail Miyamoto M.D.   On: 08/25/2014 15:52    ASSESSMENT &  PLAN:  Diffuse large B cell lymphoma I have reviewed her case at the hematology tumor board yesterday. I had a frank discussion with the patient and her mother. She has extensive stage IV disease with high-intermediate prognostic score (IPI=3). I suspect she may have double hit mutation and c-myc mutation has been requested on her breast tissue to be performed. I like to admit her today as soon as possible for hydration, rasburicase, IV fluids and order a PICC line and echocardiogram. Tomorrow, I would like to start her on slow infusion of rituximab followed by infusional EPOCH over the weekend. She would also need prophylactic CNS treatment with  intrathecal methotrexate. We discussed the role of chemotherapy. The intent is for cure.  We discussed some of the risks, benefits and side-effects of Rituximab,Cytoxan, Etoposide, Adriamycin, Vincristine and Solumedrol/Prednisone.   Some of the short term side-effects included, though not limited to, risk of fatigue, weight loss, tumor lysis syndrome, risk of allergic reactions, pancytopenia, life-threatening infections, need for transfusions of blood products, nausea, vomiting, change in bowel habits, hair loss, infertility, risk of congestive heart failure, admission to hospital for various reasons, and risks of death.   Long term side-effects are also discussed including permanent damage to nerve function, chronic fatigue, and rare secondary malignancy including bone marrow disorders.   The patient is aware that the response rates discussed earlier is not guaranteed.   Patient education material was dispensed  After a very prolonged discussion, because of her religious beliefs being a Jehovah witness and the possibility of need for transfusion products, she declined treatment at my institution and wants to be referred to a tertiary [facility] for further care. I will try to expedite her referral process and recommend her obtain a copy of her most recent imaging study to bring along at her next appointment. I did not make return appointment for the patient to come back.    No orders of the defined types were placed in this encounter.    All questions were answered. The patient knows to call the clinic with any problems, questions or concerns. I spent 40 minutes counseling the patient face to face. The total time spent in the appointment was 60 minutes and more than 50% was on counseling.     [place], [place], MD 09/03/2014 8:30 PM

## 2014-09-03 NOTE — Assessment & Plan Note (Addendum)
I have reviewed her case at the hematology tumor board yesterday. I had a frank discussion with the patient and her mother. She has extensive stage IV disease with high-intermediate prognostic score (IPI=3). I suspect she may have double hit mutation and c-myc mutation has been requested on her breast tissue to be performed. I like to admit her today as soon as possible for hydration, rasburicase, IV fluids and order a PICC line and echocardiogram. Tomorrow, I would like to start her on slow infusion of rituximab followed by infusional EPOCH over the weekend. She would also need prophylactic CNS treatment with intrathecal methotrexate. We discussed the role of chemotherapy. The intent is for cure.  We discussed some of the risks, benefits and side-effects of Rituximab,Cytoxan, Etoposide, Adriamycin, Vincristine and Solumedrol/Prednisone.   Some of the short term side-effects included, though not limited to, risk of fatigue, weight loss, tumor lysis syndrome, risk of allergic reactions, pancytopenia, life-threatening infections, need for transfusions of blood products, nausea, vomiting, change in bowel habits, hair loss, infertility, risk of congestive heart failure, admission to hospital for various reasons, and risks of death.   Long term side-effects are also discussed including permanent damage to nerve function, chronic fatigue, and rare secondary malignancy including bone marrow disorders.   The patient is aware that the response rates discussed earlier is not guaranteed.   Patient education material was dispensed  After a very prolonged discussion, because of her religious beliefs being a Jehovah witness and the possibility of need for transfusion products, she declined treatment at my institution and wants to be referred to a tertiary [facility] for further care. I will try to expedite her referral process and recommend her obtain a copy of her most recent imaging study to bring along at  her next appointment. I did not make return appointment for the patient to come back.

## 2014-09-04 ENCOUNTER — Encounter (HOSPITAL_COMMUNITY): Payer: Self-pay | Admitting: *Deleted

## 2014-09-04 ENCOUNTER — Inpatient Hospital Stay (HOSPITAL_COMMUNITY)
Admission: AD | Admit: 2014-09-04 | Discharge: 2014-09-11 | DRG: 847 | Disposition: A | Discharge status: Home / Self Care | Payer: Medicaid Other | Source: Ambulatory Visit | Attending: Internal Medicine | Admitting: Internal Medicine

## 2014-09-04 ENCOUNTER — Other Ambulatory Visit: Payer: Self-pay | Admitting: Hematology and Oncology

## 2014-09-04 DIAGNOSIS — Z79899 Other long term (current) drug therapy: Secondary | ICD-10-CM | POA: Diagnosis not present

## 2014-09-04 DIAGNOSIS — Z79891 Long term (current) use of opiate analgesic: Secondary | ICD-10-CM | POA: Diagnosis not present

## 2014-09-04 DIAGNOSIS — C8330 Diffuse large B-cell lymphoma, unspecified site: Secondary | ICD-10-CM

## 2014-09-04 DIAGNOSIS — E876 Hypokalemia: Secondary | ICD-10-CM | POA: Diagnosis present

## 2014-09-04 DIAGNOSIS — R634 Abnormal weight loss: Secondary | ICD-10-CM | POA: Diagnosis present

## 2014-09-04 DIAGNOSIS — Z5111 Encounter for antineoplastic chemotherapy: Principal | ICD-10-CM

## 2014-09-04 DIAGNOSIS — D638 Anemia in other chronic diseases classified elsewhere: Secondary | ICD-10-CM | POA: Diagnosis present

## 2014-09-04 DIAGNOSIS — K5900 Constipation, unspecified: Secondary | ICD-10-CM | POA: Diagnosis present

## 2014-09-04 DIAGNOSIS — K319 Disease of stomach and duodenum, unspecified: Secondary | ICD-10-CM | POA: Diagnosis present

## 2014-09-04 DIAGNOSIS — Z789 Other specified health status: Secondary | ICD-10-CM | POA: Diagnosis present

## 2014-09-04 DIAGNOSIS — IMO0001 Reserved for inherently not codable concepts without codable children: Secondary | ICD-10-CM | POA: Diagnosis present

## 2014-09-04 DIAGNOSIS — Z8572 Personal history of non-Hodgkin lymphomas: Secondary | ICD-10-CM | POA: Diagnosis present

## 2014-09-04 LAB — PREGNANCY, URINE: Preg Test, Ur: NEGATIVE

## 2014-09-04 MED ORDER — SODIUM CHLORIDE 0.9 % IJ SOLN: 10.0000 mL | INTRAMUSCULAR | Status: DC | PRN

## 2014-09-04 MED ORDER — SODIUM CHLORIDE 0.9 % IV SOLN
INTRAVENOUS | Status: DC
  Administered 2014-09-04 – 2014-09-05 (×2): via INTRAVENOUS

## 2014-09-04 MED ORDER — HEPARIN SODIUM (PORCINE) 5000 UNIT/ML IJ SOLN
5000.0000 [IU] | Freq: Three times a day (TID) | INTRAMUSCULAR | Status: DC
  Administered 2014-09-04: 5000 [IU] via SUBCUTANEOUS
  Filled 2014-09-04 (×6): qty 1

## 2014-09-04 MED ORDER — SODIUM CHLORIDE 0.9 % IJ SOLN
10.0000 mL | Freq: Two times a day (BID) | INTRAMUSCULAR | Status: DC
  Administered 2014-09-04 – 2014-09-11 (×8): 10 mL

## 2014-09-04 MED ORDER — ACETAMINOPHEN 500 MG PO TABS: 500.0000 mg | ORAL_TABLET | Freq: Four times a day (QID) | ORAL | Status: DC | PRN

## 2014-09-04 MED ORDER — METHYLPREDNISOLONE SODIUM SUCC 125 MG IJ SOLR
60.0000 mg | Freq: Two times a day (BID) | INTRAMUSCULAR | Status: DC
  Administered 2014-09-04 (×2): 60 mg via INTRAVENOUS
  Filled 2014-09-04 (×5): qty 0.96

## 2014-09-04 MED ORDER — LEUPROLIDE ACETATE 3.75 MG IM KIT
3.7500 mg | PACK | Freq: Once | INTRAMUSCULAR | Status: AC
  Administered 2014-09-04: 3.75 mg via INTRAMUSCULAR
  Filled 2014-09-04: qty 3.75

## 2014-09-04 MED ORDER — HYDROCODONE-ACETAMINOPHEN 5-325 MG PO TABS
1.0000 | ORAL_TABLET | ORAL | Status: DC | PRN
  Administered 2014-09-10 – 2014-09-11 (×2): 1 via ORAL
  Filled 2014-09-04 (×2): qty 1

## 2014-09-04 MED ORDER — PANTOPRAZOLE SODIUM 40 MG PO TBEC
40.0000 mg | DELAYED_RELEASE_TABLET | Freq: Every day | ORAL | Status: DC
  Administered 2014-09-04 – 2014-09-11 (×8): 40 mg via ORAL
  Filled 2014-09-04 (×8): qty 1

## 2014-09-04 MED ORDER — ALLOPURINOL 300 MG PO TABS
300.0000 mg | ORAL_TABLET | Freq: Every day | ORAL | Status: DC
  Administered 2014-09-05 – 2014-09-11 (×7): 300 mg via ORAL
  Filled 2014-09-04 (×7): qty 1

## 2014-09-04 MED ORDER — ONDANSETRON HCL 4 MG/2ML IJ SOLN: 4.0000 mg | Freq: Four times a day (QID) | INTRAMUSCULAR | Status: DC | PRN

## 2014-09-04 MED ORDER — SODIUM CHLORIDE 0.9 % IV SOLN
6.0000 mg | Freq: Once | INTRAVENOUS | Status: AC
  Administered 2014-09-04: 6 mg via INTRAVENOUS
  Filled 2014-09-04: qty 4

## 2014-09-04 NOTE — Consult Note (Signed)
[facility] CONSULT NOTE  Patient Care Team: No Pcp Per Patient as PCP - General (General Practice)  CHIEF COMPLAINTS/PURPOSE OF CONSULTATION:  Diffuse large B-cell lymphoma, admitted for cycle one of EPOCH-R chemotherapy  HISTORY OF PRESENTING ILLNESS:  Lindsey Irwin 18 y.o. female is here because of inpatient chemotherapy. Please see my detailed dictation from yesterday for further details. Oncology History   Diffuse large B cell lymphoma   Primary site: Lymphoid Neoplasms   Staging method: AJCC 6th Edition   Clinical: Stage IV signed by Heath Lark, MD on 09/03/2014  8:18 PM   Summary: Stage IV IPI of 3: high LDH, >2 extranodal disease and stage IV at presentation       Diffuse large B cell lymphoma   07/30/2014 Imaging She had a bilateral mammogram which came back abnormal. Ultrasound-guided biopsy was performed, suspicious for lymphoma   08/25/2014 Imaging PET/CT scan showed diffuse metastatic cancer involving bilateral breasts, bone, and large abdominal mass   08/28/2014 Surgery She had excisional surgery and removal of right breast mass   08/28/2014 Pathology Results Accession: [id number] right breast mass is consistent with diffuse large B-cell lymphoma with high proliferation index.   The patient contacted me this morning and changed her mind about referral to another institution. She desired to be admitted to start of chemotherapy as soon as possible.  MEDICAL HISTORY:  Past Medical History  Diagnosis Date  . Breast mass 08/2014  . Sore throat 08/25/2014  . Cough 08/2014  . Nasal congestion 08/2014  . Diffuse large B cell lymphoma 09/02/2014    SURGICAL HISTORY: Past Surgical History  Procedure Laterality Date  . No past surgeries    . Breast biopsy Right 08/28/2014    Procedure: RIGHT EXCISIONAL BREAST BIOPSY;  Surgeon: Donnie Mesa, MD;  Location: [facility];  Service: General;  Laterality: Right;    SOCIAL HISTORY: History    Social History  . Marital Status: Single    Spouse Name: N/A    Number of Children: N/A  . Years of Education: N/A   Occupational History  . Not on file.   Social History Main Topics  . Smoking status: Never Smoker   . Smokeless tobacco: Never Used  . Alcohol Use: No  . Drug Use: No  . Sexual Activity: Not on file   Other Topics Concern  . Not on file   Social History Narrative  . No narrative on file    FAMILY HISTORY: History reviewed. No pertinent family history.  ALLERGIES:  is allergic to other and soap.  MEDICATIONS:  Current Facility-Administered Medications  Medication Dose Route Frequency Provider Last Rate Last Dose  . 0.9 %  sodium chloride infusion   Intravenous Continuous Heath Lark, MD 125 mL/hr at 09/04/14 1400    . [START ON 09/05/2014] allopurinol (ZYLOPRIM) tablet 300 mg  300 mg Oral Daily Kennon Encinas, MD      . heparin injection 5,000 Units  5,000 Units Subcutaneous 3 times per day Barton Dubois, MD      . methylPREDNISolone sodium succinate (SOLU-MEDROL) 125 mg/2 mL injection 60 mg  60 mg Intravenous BID Heath Lark, MD   60 mg at 09/04/14 1455  . pantoprazole (PROTONIX) EC tablet 40 mg  40 mg Oral Q1200 Barton Dubois, MD   40 mg at 09/04/14 1720  . sodium chloride 0.9 % injection 10-40 mL  10-40 mL Intracatheter Q12H Barton Dubois, MD      . sodium chloride 0.9 % injection  10-40 mL  10-40 mL Intracatheter PRN Barton Dubois, MD        REVIEW OF SYSTEMS:   Constitutional: Denies fevers, chills or abnormal night sweats Eyes: Denies blurriness of vision, double vision or watery eyes Ears, nose, mouth, throat, and face: Denies mucositis or sore throat Respiratory: Denies cough, dyspnea or wheezes Cardiovascular: Denies palpitation, chest discomfort or lower extremity swelling Gastrointestinal:  Denies nausea, heartburn or change in bowel habits Skin: Denies abnormal skin rashes Lymphatics: Denies new lymphadenopathy or easy  bruising Neurological:Denies numbness, tingling or new weaknesses Behavioral/Psych: Mood is stable, no new changes  All other systems were reviewed with the patient and are negative.  PHYSICAL EXAMINATION: ECOG PERFORMANCE STATUS: 0 - Asymptomatic  Filed Vitals:   09/04/14 1131  BP: 132/58  Pulse: 80  Temp: 98.5 F (36.9 C)  Resp: 18   Filed Weights   09/04/14 1131  Weight: 195 lb 12.3 oz (88.8 kg)    GENERAL:alert, no distress and comfortable she is morbidly obese SKIN: skin color, texture, turgor are normal, no rashes or significant lesions EYES: normal, conjunctiva are pink and non-injected, sclera clear OROPHARYNX:no exudate, no erythema and lips, buccal mucosa, and tongue normal  NECK: supple, thyroid normal size, non-tender, without nodularity LYMPH:  no palpable lymphadenopathy in the cervical, axillary or inguinal LUNGS: clear to auscultation and percussion with normal breathing effort HEART: regular rate & rhythm and no murmurs and no lower extremity edema ABDOMEN:abdomen soft, non-tender and normal bowel sounds Musculoskeletal:no cyanosis of digits and no clubbing  PSYCH: alert & oriented x 3 with fluent speech NEURO: no focal motor/sensory deficits  LABORATORY DATA:  I have reviewed the data as listed Lab Results  Component Value Date   WBC 8.0 09/03/2014   HGB 12.3 09/03/2014   HCT 38.1 09/03/2014   MCV 84.5 09/03/2014   PLT 374 09/03/2014    Recent Labs  08/15/14 1418 09/03/14 1135  NA 142 141  K 3.9 4.1  CO2 27 27  GLUCOSE 77 82  BUN 7.4 12.8  CREATININE 0.8 0.8  CALCIUM 10.0 10.1  PROT 7.0 7.0  ALBUMIN 3.7 3.9  AST 22 13  ALT 9 8  ALKPHOS 69 65  BILITOT 0.29 0.21    RADIOGRAPHIC STUDIES: I have personally reviewed the radiological images as listed and agreed with the findings in the report. Nm Pet Image Initial (pi) Skull Base To Thigh  08/25/2014   CLINICAL DATA:  Initial treatment strategy for nodular lymphoma.  EXAM: NUCLEAR  MEDICINE PET SKULL BASE TO THIGH  TECHNIQUE: 10.3 mCi F-18 FDG was injected intravenously. Full-ring PET imaging was performed from the skull base to thigh after the radiotracer. CT data was obtained and used for attenuation correction and anatomic localization.  FASTING BLOOD GLUCOSE:  Value: 88 mg/dl  COMPARISON:  None  FINDINGS: NECK  Bilateral palatine tonsil hypermetabolism which is favored to be physiologic. No correlate CT mass. This measures a S.U.V. max of 8.8.  CHEST  Right greater the left hypermetabolic breast masses. The right-sided mass measures a S.U.V. max of 12.9. On the order of 9.8 x 7.6 cm. Image 72.  ABDOMEN/PELVIS  Dominant abdominal pelvic hypermetabolic mass. Superiorly, this measures 8.9 x 15.8 Cm. Inferiorly, 9.6 x 12.4 cm. This measures a S.U.V. max of 14.9.  SKELETON  Multi focal upper metabolic foci, consistent with lymphomatous marrow involvement. Index left humeral head lesion measures a S.U.V. max of 12.3. T8 hypermetabolic focus.  CT IMAGES PERFORMED FOR ATTENUATION CORRECTION  Mucosal thickening  right maxillary sinus. No cervical adenopathy. No axillary adenopathy. Small retroperitoneal nodes, without retroperitoneal adenopathy.  No bowel obstruction. Small volume cul-de-sac fluid which could be physiologic or related to the abdominal pelvic mass. Uterus not well visualized.  IMPRESSION: 1. Markedly hypermetabolic active lymphoma within the breasts, abdomen/pelvis, and marrow space. 2. Likely physiologic palatine tonsil hypermetabolism.   Electronically Signed   By: Abigail Miyamoto M.D.   On: 08/25/2014 15:52    ASSESSMENT & PLAN:  #1 diffuse large B-cell lymphoma She has extensive stage IV disease with high-intermediate prognostic score (IPI=3). I suspect she may have double hit mutation and c-myc mutation has been requested on her breast tissue to be performed.  I like to admit her today rasburicase, IV fluids, IV steroids and order a PICC line and echocardiogram.  Tomorrow, I  would like to start her on infusional EPOCH over the weekend.  Next week, she will begin treatment with rituximab. I think that by splitting treatment that way, she would have less risk of life-threatening allergic reaction. She would also need prophylactic CNS treatment with intrathecal methotrexate next week. We discussed the role of chemotherapy. The intent is for cure.  We discussed some of the risks, benefits and side-effects of Rituximab,Cytoxan, Etoposide, Adriamycin, Vincristine and Solumedrol/Prednisone.  Some of the short term side-effects included, though not limited to, risk of fatigue, weight loss, tumor lysis syndrome, risk of allergic reactions, pancytopenia, life-threatening infections, need for transfusions of blood products, nausea, vomiting, change in bowel habits, hair loss, infertility, risk of congestive heart failure, admission to hospital for various reasons, and risks of death.  Long term side-effects are also discussed including permanent damage to nerve function, chronic fatigue, and rare secondary malignancy including bone marrow disorders.  The patient is aware that the response rates discussed earlier is not guaranteed.  Patient education material was dispensed  To preserve her fertility, I will start her on one dose of Lupron.  #2 tumor lysis prophylaxis She will be given rasburicase and IV fluids. She will be started on allopurinol tomorrow.  #3 religious belief against transfusion support To honor her wishes, we will not be transfusing her. As soon as her treatment is completed, she will be started on Procrit injection.  #4 DVT prophylaxis She will be on Lovenox and we can discontinue that if her platelet count dropped to less than 50,000  #5 CODE STATUS Full code  Orders Placed This Encounter  Procedures  . Diet regular    Standing Status: Standing     Number of Occurrences: 1     Standing Expiration Date:     Order Specific Question:  Room service  appropriate?    Answer:  Yes    Order Specific Question:  Fluid consistency:    Answer:  Thin  . Height and weight    Standing Status: Standing     Number of Occurrences: 1     Standing Expiration Date:   . Apply neutral pressure cap if fluids are not infusing    Standing Status: Standing     Number of Occurrences: 20     Standing Expiration Date:   Marland Kitchen May draw labs from central line    Standing Status: Standing     Number of Occurrences: 20     Standing Expiration Date:   . For first catheter occlusion refer to adult first dose order for central line tPa catheter clearance order set    Standing Status: Standing     Number of Occurrences:  1     Standing Expiration Date:   . 1 % lidocaine HCL, up to 5 ml intradermal at insertion site for line placement    Standing Status: Standing     Number of Occurrences: 1     Standing Expiration Date:   . ECG technology used to confirm placement    Standing Status: Standing     Number of Occurrences: 1     Standing Expiration Date:   . PICC line is ready to use    Standing Status: Standing     Number of Occurrences: 1     Standing Expiration Date:   . chemotherapy pharmacy monitoring    Standing Status: Standing     Number of Occurrences: 1     Standing Expiration Date:   . Consult to dietitian    Standing Status: Standing     Number of Occurrences: 1     Standing Expiration Date:     Order Specific Question:  Reason for consult?    Answer:  Assessment of nutrition requirements/status  . 2D Echocardiogram without contrast    Standing Status: Standing     Number of Occurrences: 1     Standing Expiration Date:     Order Specific Question:  Type of Echo    Answer:  Limited    Order Specific Question:  Reason for exam-Echo    Answer:  Chemo  V67.2 / Z09  . Insert PICC line    Confirmation of placement, care and maintenance per site specific policy.    Standing Status: Standing     Number of Occurrences: 1     Standing Expiration  Date:     Order Specific Question:  Number of lumens    Answer:  2    Order Specific Question:  Extremity limitations    Answer:  No restrictions    Order Specific Question:  Does the patient have CKD Stage 3 or greater (creatinine >2.0 and /or GFR <43)    Answer:  No    All questions were answered. The patient knows to call the clinic with any problems, questions or concerns.    [facility], [place], MD 09/04/2014 7:10 PM

## 2014-09-04 NOTE — H&P (Signed)
[facility] History and Physical  Lindsey Irwin KKX:[id number] DOB: [birth year] DOA: 09/04/2014  Referring physician: Dr. Alvy Bimler PCP: No PCP Per Patient   Chief Complaint: diffuse large cell lymphoma; admitted for chemotherapy  HPI: Lindsey Irwin is a 18 y.o. female with PMH significant for breast masses bilaterally and new diagnosis of diffuse large Cell lymphoma. Coming to hospital from Cancer center for acute chemotherapy. Patient has had 45 pounds weight loss in the last 8 months or so, but according to her intentional. Denies fever, chills, night sweats, CP, SOB, abd pain, nausea, vomiting, diarrhea, dysuria, or any other complaints. Actively follow by Dr. Alvy Bimler, who will direct oncology therapy inpatient.   Review of Systems:  Negative except as mentioned on HPI.  Past Medical History  Diagnosis Date  . Breast mass 08/2014  . Sore throat 08/25/2014  . Cough 08/2014  . Nasal congestion 08/2014  . Diffuse large B cell lymphoma 09/02/2014   Past Surgical History  Procedure Laterality Date  . No past surgeries    . Breast biopsy Right 08/28/2014    Procedure: RIGHT EXCISIONAL BREAST BIOPSY;  Surgeon: Donnie Mesa, MD;  Location: [facility];  Service: General;  Laterality: Right;   Social History:  reports that she has never smoked. She has never used smokeless tobacco. She reports that she does not drink alcohol or use illicit drugs.  Allergies  Allergen Reactions  . Other     NO BLOOD PRODUCTS. PT IS A JEHOVAH WITNESS.  Marland Kitchen Soap Itching    GAIN LAUNDRY DETERGENT Allergic to tide and surf also    History reviewed. No pertinent family history.   Prior to Admission medications   Medication Sig Start Date End Date Taking? Authorizing Provider  acetaminophen (TYLENOL) 500 MG tablet Take 500 mg by mouth every 6 (six) hours as needed for headache.   Yes Historical Provider, MD  HYDROcodone-acetaminophen (NORCO/VICODIN) 5-325 MG per tablet Take 1 tablet by  mouth every 4 (four) hours as needed. 08/28/14  Yes Donnie Mesa, MD   Physical Exam: Filed Vitals:   09/04/14 1131  BP: 132/58  Pulse: 80  Temp: 98.5 F (36.9 C)  TempSrc: Oral  Resp: 18  Height: 5\' 6"  (1.676 m)  Weight: 88.8 kg (195 lb 12.3 oz)  SpO2: 100%    Wt Readings from Last 3 Encounters:  09/04/14 88.8 kg (195 lb 12.3 oz) (97%*, Z = 1.88)  09/03/14 91.899 kg (202 lb 9.6 oz) (98%*, Z = 1.98)  08/28/14 93.214 kg (205 lb 8 oz) (98%*, Z = 2.02)   * Growth percentiles are based on CDC 2-20 Years data.    General:  Appears calm and comfortable, no fever Eyes: PERRL, normal lids, irises & conjunctiva, no icterus ENT: grossly normal hearing, lips & tongue, no erythema, no exudates and no thrush Neck: no LAD, masses or thyromegaly Cardiovascular: RRR, no m/r/g. No LE edema. Telemetry: SR, no arrhythmias  Respiratory: CTA bilaterally, no w/r/r. Normal respiratory effort. Abdomen: soft, ntnd Skin: no rash or induration seen on limited exam Musculoskeletal: grossly normal tone BUE/BLE Psychiatric: grossly normal mood and affect, speech fluent and appropriate Neurologic: grossly non-focal.          Labs on Admission:  Basic Metabolic Panel:  Recent Labs Lab 09/03/14 1135  NA 141  K 4.1  CO2 27  GLUCOSE 82  BUN 12.8  CREATININE 0.8  CALCIUM 10.1   Liver Function Tests:  Recent Labs Lab 09/03/14 1135  AST 13  ALT 8  ALKPHOS 65  BILITOT 0.21  PROT 7.0  ALBUMIN 3.9   CBC:  Recent Labs Lab 09/03/14 1136  WBC 8.0  NEUTROABS 5.8  HGB 12.3  HCT 38.1  MCV 84.5  PLT 374    Radiological Exams on Admission: No results found.  EKG:  None  Assessment/Plan 1-Diffuse large B cell lymphoma: stage IV -following oncology recommendations admission is for acute chemotherapy -patient fair ly asymptomatic currently -will start IVF's -place PICC line -follow oncology rec's for chemotherapy agents -cover GI tract with PPI due to use of steroids -follow  electrolytes and renal function  2-Tumor lysis prophylaxis:  -IVF's and allopurinol  3-religious belief against transfusion support  To honor her wishes, we will not be transfusing her. As soon as her treatment is completed, she will be started on Procrit injection.   Oncology (Dr. Alvy Bimler)   Code Status: Full DVT Prophylaxis:heparin  Family Communication: mother at bedside Disposition Plan: inpatient, med-surg; LOS > 2 midnights  Time spent: 60 minutes  Barton Dubois [facility] Pager [phone]

## 2014-09-04 NOTE — Progress Notes (Signed)
Peripherally Inserted Central Catheter/Midline Placement  The IV Nurse has discussed with the patient and/or persons authorized to consent for the patient, the purpose of this procedure and the potential benefits and risks involved with this procedure.  The benefits include less needle sticks, lab draws from the catheter and patient may be discharged home with the catheter.  Risks include, but not limited to, infection, bleeding, blood clot (thrombus formation), and puncture of an artery; nerve damage and irregular heat beat.  Alternatives to this procedure were also discussed.  PICC/Midline Placement Documentation        Darlyn Read 09/04/2014, 5:16 PM

## 2014-09-04 NOTE — Progress Notes (Signed)
  Echocardiogram 2D Echocardiogram has been performed.  Lindsey Irwin 09/04/2014, 3:14 PM

## 2014-09-04 NOTE — Progress Notes (Signed)
INITIAL NUTRITION ASSESSMENT  DOCUMENTATION CODES Per approved criteria  -Obesity Unspecified   INTERVENTION:  Encourage PO intake  RD to continue to follow  NUTRITION DIAGNOSIS: Increased nutrient needs related to advanced cancer as evidenced by estimated nutrient needs.   Goal: Pt to meet >/= 90% of their estimated nutrition needs   Monitor:  PO intake, weight, labs, I/O's  Reason for Assessment: Pt identified as at nutrition risk on the Malnutrition Screen Tool  Admitting Dx: Diffuse large B cell lymphoma  ASSESSMENT: 18 y.o. female with PMH significant for breast masses bilaterally and new diagnosis of diffuse large Cell lymphoma. Coming to hospital from Cancer center for acute chemotherapy. Patient has had 45 pounds weight loss in the last 8 months or so, but according to her intentional.   PO intake: 100% Pt reports wt loss of 45 lb which started in May. Pt states that at this time she was trying to lose weight. In August she noticed her appetite decreased.  PTA pt states only eating 2 meals/day, most of the time she skips breakfast.   Discussed the importance of eating 3 meals a day with snacks in between. Provided suggestions of breakfast choices. Pt does not want any nutritional supplement at this time as she believes she can eat most of her meals with no problems.  Nutrition focused physical exam shows no sign of depletion of muscle mass or body fat.  Labs reviewed.  Height: Ht Readings from Last 1 Encounters:  09/04/14 5\' 6"  (1.676 m) (75%*, Z = 0.66)   * Growth percentiles are based on CDC 2-20 Years data.    Weight: Wt Readings from Last 1 Encounters:  09/04/14 195 lb 12.3 oz (88.8 kg) (97%*, Z = 1.88)   * Growth percentiles are based on CDC 2-20 Years data.    Ideal Body Weight: 130 lb  % Ideal Body Weight: 150%  Wt Readings from Last 10 Encounters:  09/04/14 195 lb 12.3 oz (88.8 kg) (97%*, Z = 1.88)  09/03/14 202 lb 9.6 oz (91.899 kg) (98%*, Z  = 1.98)  08/28/14 205 lb 8 oz (93.214 kg) (98%*, Z = 2.02)  08/28/14 205 lb 8 oz (93.214 kg) (98%*, Z = 2.02)  08/15/14 205 lb 1.6 oz (93.033 kg) (98%*, Z = 2.02)   * Growth percentiles are based on CDC 2-20 Years data.    Usual Body Weight: 205 lb -per pt  % Usual Body Weight: 95%  BMI:  Body mass index is 31.61 kg/(m^2).  Estimated Nutritional Needs: Kcal: 2000-2200 Protein: 85-95g Fluid: 2L/day  Skin: intact  Diet Order: General  EDUCATION NEEDS: -No education needs identified at this time   Intake/Output Summary (Last 24 hours) at 09/04/14 2218 Last data filed at 09/04/14 1851  Gross per 24 hour  Intake    240 ml  Output    450 ml  Net   -210 ml    Last BM: 10/21  Labs:   Recent Labs Lab 09/03/14 1135  NA 141  K 4.1  CO2 27  BUN 12.8  CREATININE 0.8  CALCIUM 10.1  GLUCOSE 82    CBG (last 3)  No results found for this basename: GLUCAP,  in the last 72 hours  Scheduled Meds: . [START ON 09/05/2014] allopurinol  300 mg Oral Daily  . heparin subcutaneous  5,000 Units Subcutaneous 3 times per day  . methylPREDNISolone (SOLU-MEDROL) injection  60 mg Intravenous BID  . pantoprazole  40 mg Oral Q1200  . sodium chloride  10-40 mL Intracatheter Q12H    Continuous Infusions: . sodium chloride 125 mL/hr at 09/04/14 1400    Past Medical History  Diagnosis Date  . Breast mass 08/2014  . Sore throat 08/25/2014  . Cough 08/2014  . Nasal congestion 08/2014  . Diffuse large B cell lymphoma 09/02/2014    Past Surgical History  Procedure Laterality Date  . No past surgeries    . Breast biopsy Right 08/28/2014    Procedure: RIGHT EXCISIONAL BREAST BIOPSY;  Surgeon: Donnie Mesa, MD;  Location: [facility];  Service: General;  Laterality: Right;   Clayton Bibles, MS, RD, LDN Pager: [phone] After Hours Pager: [phone]

## 2014-09-05 DIAGNOSIS — IMO0001 Reserved for inherently not codable concepts without codable children: Secondary | ICD-10-CM | POA: Diagnosis present

## 2014-09-05 DIAGNOSIS — Z789 Other specified health status: Secondary | ICD-10-CM | POA: Diagnosis present

## 2014-09-05 MED ORDER — COLD PACK MISC ONCOLOGY
1.0000 | Freq: Once | Status: AC | PRN
  Filled 2014-09-05: qty 1

## 2014-09-05 MED ORDER — HEPARIN SOD (PORK) LOCK FLUSH 100 UNIT/ML IV SOLN: 250.0000 [IU] | Freq: Once | INTRAVENOUS | Status: AC | PRN

## 2014-09-05 MED ORDER — HEPARIN SOD (PORK) LOCK FLUSH 100 UNIT/ML IV SOLN: 500.0000 [IU] | Freq: Once | INTRAVENOUS | Status: AC | PRN

## 2014-09-05 MED ORDER — SODIUM CHLORIDE 0.9 % IJ SOLN: 3.0000 mL | INTRAMUSCULAR | Status: DC | PRN

## 2014-09-05 MED ORDER — SODIUM CHLORIDE 0.9 % IV SOLN
INTRAVENOUS | Status: AC
  Administered 2014-09-05 – 2014-09-08 (×4): 8 mg via INTRAVENOUS
  Filled 2014-09-05 (×4): qty 4

## 2014-09-05 MED ORDER — FUROSEMIDE 10 MG/ML IJ SOLN
20.0000 mg | Freq: Every day | INTRAMUSCULAR | Status: DC | PRN
  Filled 2014-09-05: qty 2

## 2014-09-05 MED ORDER — ALTEPLASE 2 MG IJ SOLR
2.0000 mg | Freq: Once | INTRAMUSCULAR | Status: AC | PRN
  Filled 2014-09-05: qty 2

## 2014-09-05 MED ORDER — VINCRISTINE SULFATE CHEMO INJECTION 1 MG/ML
INTRAVENOUS | Status: AC
  Administered 2014-09-05 – 2014-09-08 (×4): via INTRAVENOUS
  Filled 2014-09-05 (×4): qty 10

## 2014-09-05 MED ORDER — ENOXAPARIN SODIUM 40 MG/0.4ML ~~LOC~~ SOLN
40.0000 mg | SUBCUTANEOUS | Status: DC
  Administered 2014-09-05 – 2014-09-10 (×6): 40 mg via SUBCUTANEOUS
  Filled 2014-09-05 (×8): qty 0.4

## 2014-09-05 MED ORDER — HOT PACK MISC ONCOLOGY
1.0000 | Freq: Once | Status: AC | PRN
  Filled 2014-09-05: qty 1

## 2014-09-05 MED ORDER — SODIUM CHLORIDE 0.9 % IJ SOLN: 10.0000 mL | INTRAMUSCULAR | Status: DC | PRN

## 2014-09-05 MED ORDER — SODIUM CHLORIDE 0.9 % IV SOLN
INTRAVENOUS | Status: DC
  Administered 2014-09-05: 14:00:00 via INTRAVENOUS

## 2014-09-05 NOTE — Progress Notes (Signed)
Progress Note   Vicci Reder NOB:[id number] DOB: [birth year] DOA: 09/04/2014 PCP: No PCP Per Patient   Brief Narrative:   Lindsey Irwin is an 18 y.o. female with PMH of diffuse large cell lymphoma, significant weight loss who was admitted 09/04/14 for initiation of chemotherapy.  Assessment/Plan:   Principal Problem:   Diffuse large B cell lymphoma  To start chemo today with EPOCH.  She will need prophylactic CNS treatment with intrathecal methotrexate.  Dr. Alvy Bimler following.  Monitor closely for tumor lysis. Continue allopurinol.  Active Problems:   Patient is Jehovah's Witness  No transfusions, will need Procrit for severe anemia prophylaxis post chemo.    DVT Prophylaxis  Continue Lovenox as long as platelet count greater than 50,000.  Code Status: Full. Family Communication: Mother at beside. Disposition Plan: Home when stable.   IV Access:    PICC line placed 09/04/14   Procedures and diagnostic studies:   No results found.   Medical Consultants:    Dr. Heath Lark, Oncology.  Anti-Infectives:    None.  Subjective:   Lindsey Irwin is nervous about the chemotherapy, but otherwise without significant complaints. No nausea, vomiting, shortness of breath, cough. No current complaints of pain.  Objective:    Filed Vitals:   09/04/14 1131 09/04/14 2113 09/05/14 0513  BP: 132/58 131/87 129/74  Pulse: 80 104 85  Temp: 98.5 F (36.9 C) 97.9 F (36.6 C) 98.4 F (36.9 C)  TempSrc: Oral Oral Oral  Resp: 18 18 18   Height: 5\' 6"  (1.676 m)    Weight: 88.8 kg (195 lb 12.3 oz)    SpO2: 100% 98% 100%    Intake/Output Summary (Last 24 hours) at 09/05/14 1449 Last data filed at 09/05/14 1120  Gross per 24 hour  Intake    360 ml  Output    850 ml  Net   -490 ml    Exam: Gen:  NAD Cardiovascular:  RRR, No M/R/G Respiratory:  Lungs CTAB Gastrointestinal:  Abdomen soft, NT/ND, + BS Extremities:  No C/E/C   Data Reviewed:    Labs: Basic  Metabolic Panel:  Recent Labs Lab 09/03/14 1135  NA 141  K 4.1  CO2 27  GLUCOSE 82  BUN 12.8  CREATININE 0.8  CALCIUM 10.1   GFR Estimated Creatinine Clearance: 127 ml/min (by C-G formula based on Cr of 0.8). Liver Function Tests:  Recent Labs Lab 09/03/14 1135  AST 13  ALT 8  ALKPHOS 65  BILITOT 0.21  PROT 7.0  ALBUMIN 3.9    CBC:  Recent Labs Lab 09/03/14 1136  WBC 8.0  NEUTROABS 5.8  HGB 12.3  HCT 38.1  MCV 84.5  PLT 374   Microbiology No results found for this or any previous visit (from the past 240 hour(s)).   Medications:   . allopurinol  300 mg Oral Daily  . DOXOrubicin/vinCRIStine/etoposide CHEMO IV infusion for Inpatient CI   Intravenous Q24H  . enoxaparin (LOVENOX) injection  40 mg Subcutaneous Q24H  . ondansetron (ZOFRAN) with dexamethasone (DECADRON) IV   Intravenous Q24H  . pantoprazole  40 mg Oral Q1200  . sodium chloride  10-40 mL Intracatheter Q12H   Continuous Infusions: . sodium chloride 125 mL/hr at 09/04/14 1400  . sodium chloride 20 mL/hr at 09/05/14 1429    Time spent: 25 minutes.   LOS: 1 day   RAMA,CHRISTINA  [facility] Pager [phone]. If unable to reach me by pager, please call my cell phone at [phone].  *Please refer  to [email or web address], password [email or web address] to get updated schedule on who will round on this patient, as hospitalists switch teams weekly. If 7PM-7AM, please contact night-coverage at [email or web address], password [email or web address] for any overnight needs.  09/05/2014, 2:49 PM

## 2014-09-05 NOTE — Progress Notes (Signed)
Initial chemotherapy education completed and chemotherapy consent signed by patient.

## 2014-09-05 NOTE — Progress Notes (Signed)
Lindsey Irwin   DOB:1996-02-26   MG#:[id number]    Subjective: She feels well. She had PICC line placed. No complaints.  Objective:  Filed Vitals:   09/05/14 0513  BP: 129/74  Pulse: 85  Temp: 98.4 F (36.9 C)  Resp: 18     Intake/Output Summary (Last 24 hours) at 09/05/14 0817 Last data filed at 09/05/14 3267  Gross per 24 hour  Intake    240 ml  Output    850 ml  Net   -610 ml    GENERAL:alert, no distress and comfortable SKIN: skin color, texture, turgor are normal, no rashes or significant lesions EYES: normal, Conjunctiva are pink and non-injected, sclera clear OROPHARYNX:no exudate, no erythema and lips, buccal mucosa, and tongue normal  NECK: supple, thyroid normal size, non-tender, without nodularity LYMPH:  no palpable lymphadenopathy in the cervical, axillary or inguinal LUNGS: clear to auscultation and percussion with normal breathing effort HEART: regular rate & rhythm and no murmurs and no lower extremity edema ABDOMEN:abdomen soft, non-tender and normal bowel sounds Musculoskeletal:no cyanosis of digits and no clubbing  NEURO: alert & oriented x 3 with fluent speech, no focal motor/sensory deficits   Labs:  Lab Results  Component Value Date   WBC 8.0 09/03/2014   HGB 12.3 09/03/2014   HCT 38.1 09/03/2014   MCV 84.5 09/03/2014   PLT 374 09/03/2014   NEUTROABS 5.8 09/03/2014    Lab Results  Component Value Date   NA 141 09/03/2014   K 4.1 09/03/2014   CO2 27 09/03/2014   Assessment & Plan:  #1 diffuse large B-cell lymphoma  She has extensive stage IV disease with high-intermediate prognostic score (IPI=3). I suspect she may have double hit mutation and c-myc mutation has been requested on her breast tissue to be performed.  She will start infusional EPOCH over the weekend starting today, 09/05/14.  Next week, she will begin treatment with rituximab. I think that by splitting treatment that way, she would have less risk of life-threatening allergic  reaction.  She would also need prophylactic CNS treatment with intrathecal methotrexate next week.  We discussed the role of chemotherapy. The intent is for cure.  To preserve her fertility, she was give one dose of Lupron on 09/05/14.   #2 tumor lysis prophylaxis  She will be given rasburicase on 09/05/14 and is currently on 125 IV fluids.  Please monitor fluid balance carefully. If she has over 1000 cc positive fluid balance, please administer 20 mg Lasix IV. It is currently on profile as prn.  She is on allopurinol 300 mg BID. If there are signs of tumor lysis, please administer another 6 mg rasburicase IV, increase labs frequency monitoring and consider force diuresis.   #3 religious belief against transfusion support  To honor her wishes, we will not be transfusing her. As soon as her treatment is completed, she will be started on Procrit injection.   #4 DVT prophylaxis  She will be on Lovenox and we can discontinue that if her platelet count dropped to less than 50,000   #5 CODE STATUS Full code  Another physician will cover for me this weekend. I will return on Monday. Please call if questions arise.  [place], [place], MD 09/05/2014  8:17 AM

## 2014-09-05 NOTE — Care Management Note (Signed)
CARE MANAGEMENT NOTE 09/05/2014  Patient:  Lindsey Irwin, Lindsey Irwin   Account Number:  [id number]  Date Initiated:  09/05/2014  Documentation initiated by:  Marney Doctor  Subjective/Objective Assessment:   diffuse large cell lymphoma; admitted for chemotherapy     Action/Plan:   From home with mom   Anticipated DC Date:  09/08/2014   Anticipated DC Plan:  [place]  CM consult      Choice offered to / List presented to:             Status of service:  In process, will continue to follow Medicare Important Message given?   (If response is "NO", the following Medicare IM given date fields will be blank) Date Medicare IM given:   Medicare IM given by:   Date Additional Medicare IM given:   Additional Medicare IM given by:    Discharge Disposition:    Per UR Regulation:  Reviewed for med. necessity/level of care/duration of stay  If discussed at [place] of Stay Meetings, dates discussed:    Comments:  09/05/14 Marney Doctor RN,BSN,NCM Chart reviewed.  CM following for DC needs.

## 2014-09-06 DIAGNOSIS — Z5112 Encounter for antineoplastic immunotherapy: Secondary | ICD-10-CM

## 2014-09-06 LAB — BASIC METABOLIC PANEL
ANION GAP: 10 (ref 5–15)
BUN: 10 mg/dL (ref 6–23)
CHLORIDE: 107 meq/L (ref 96–112)
CO2: 25 meq/L (ref 19–32)
Calcium: 9 mg/dL (ref 8.4–10.5)
Creatinine, Ser: 0.73 mg/dL (ref 0.50–1.10)
GFR calc non Af Amer: >90 mL/min (ref >90)
Glucose, Bld: 108 mg/dL — ABNORMAL HIGH (ref 70–99)
Potassium: 3.9 mEq/L (ref 3.7–5.3)
Sodium: 142 mEq/L (ref 137–147)

## 2014-09-06 LAB — URIC ACID: Uric Acid, Serum: 0.5 mg/dL — ABNORMAL LOW (ref 2.4–7.0)

## 2014-09-06 LAB — LACTATE DEHYDROGENASE: LDH: 239 U/L (ref 94–250)

## 2014-09-06 MED ORDER — SODIUM CHLORIDE 0.9 % IV SOLN
INTRAVENOUS | Status: DC
  Administered 2014-09-06 – 2014-09-11 (×7): via INTRAVENOUS

## 2014-09-06 NOTE — Progress Notes (Addendum)
Progress Note   Precilla Irwin IRW:[id number] DOB: 1996/04/19 DOA: 09/04/2014 PCP: No PCP Per Patient   Brief Narrative:   Lindsey Irwin is an 18 y.o. female with PMH of diffuse large cell lymphoma, significant weight loss who was admitted 09/04/14 for initiation of chemotherapy.  Assessment/Plan:   Principal Problem:   Diffuse large B cell lymphoma  Receiving chemo [facility]), day #2.  She will need prophylactic CNS treatment with intrathecal methotrexate.  Dr. Alvy Bimler following.  Monitor closely for tumor lysis. Continue allopurinol. LDH 239, uric acid 0.5.  Active Problems:   Patient is Jehovah's Witness  No transfusions, will need Procrit for severe anemia prophylaxis post chemo.    DVT Prophylaxis  Continue Lovenox as long as platelet count greater than 50,000.  Code Status: Full. Family Communication: Mother at beside 09/05/14. Disposition Plan: Home when stable.   IV Access:    PICC line placed 09/04/14   Procedures and diagnostic studies:   No results found.   Medical Consultants:    Dr. Heath Lark, Oncology.  Anti-Infectives:    None.  Subjective:   Lindsey Irwin is without complaints of nausea, vomiting, shortness of breath, cough, pain, or diarrhea. States that her appetite is good.  Objective:    Filed Vitals:   09/05/14 0513 09/05/14 1600 09/05/14 2144 09/06/14 0538  BP: 129/74 115/58 128/75 126/51  Pulse: 85 66 60 81  Temp: 98.4 F (36.9 C) 98.5 F (36.9 C) 97.6 F (36.4 C) 97.5 F (36.4 C)  TempSrc: Oral Oral Oral Oral  Resp: 18 16 16 16   Height:      Weight:      SpO2: 100% 100% 100% 99%    Intake/Output Summary (Last 24 hours) at 09/06/14 0759 Last data filed at 09/06/14 0539  Gross per 24 hour  Intake    360 ml  Output   2800 ml  Net  -2440 ml    Exam: Gen:  NAD Cardiovascular:  RRR, No M/R/G Respiratory:  Lungs CTAB Gastrointestinal:  Abdomen soft, NT/ND, + BS Extremities:  No C/E/C   Data Reviewed:     Labs: Basic Metabolic Panel:  Recent Labs Lab 09/03/14 1135 09/06/14 0606  NA 141 142  K 4.1 3.9  CL  --  107  CO2 27 25  GLUCOSE 82 108*  BUN 12.8 10  CREATININE 0.8 0.73  CALCIUM 10.1 9.0   GFR Estimated Creatinine Clearance: 127 ml/min (by C-G formula based on Cr of 0.73). Liver Function Tests:  Recent Labs Lab 09/03/14 1135  AST 13  ALT 8  ALKPHOS 65  BILITOT 0.21  PROT 7.0  ALBUMIN 3.9    CBC:  Recent Labs Lab 09/03/14 1136  WBC 8.0  NEUTROABS 5.8  HGB 12.3  HCT 38.1  MCV 84.5  PLT 374   Microbiology No results found for this or any previous visit (from the past 240 hour(s)).   Medications:   . allopurinol  300 mg Oral Daily  . DOXOrubicin/vinCRIStine/etoposide CHEMO IV infusion for Inpatient CI   Intravenous Q24H  . enoxaparin (LOVENOX) injection  40 mg Subcutaneous Q24H  . ondansetron (ZOFRAN) with dexamethasone (DECADRON) IV   Intravenous Q24H  . pantoprazole  40 mg Oral Q1200  . sodium chloride  10-40 mL Intracatheter Q12H   Continuous Infusions: . sodium chloride 20 mL/hr at 09/05/14 1429  . sodium chloride      Time spent: 15 minutes.   LOS: 2 days   RAMA,CHRISTINA  [facility] Pager [phone].  If unable to reach me by pager, please call my cell phone at [phone].  *Please refer to [email or web address], password [email or web address] to get updated schedule on who will round on this patient, as hospitalists switch teams weekly. If 7PM-7AM, please contact night-coverage at [email or web address], password [email or web address] for any overnight needs.  09/06/2014, 7:59 AM

## 2014-09-06 NOTE — Progress Notes (Signed)
Lindsey Irwin   DOB:23-Jun-1996   WN#:[id number]   QIH#:[id number]  Subjective: currently day 2 EPOCH; denies N/V, mouth sores, h/a, visual changes, dizzyness, fatigue; diarrhea or dysuria; PICC "fine"; no rash, no bleeding, no pain, no fever to report. Second young woman in bed and 3d on sofa under window; mood generally up   Objective: young African American woman examined in bed Filed Vitals:   09/06/14 0538  BP: 126/51  Pulse: 81  Temp: 97.5 F (36.4 C)  Resp: 16    Body mass index is 31.61 kg/(m^2).  Intake/Output Summary (Last 24 hours) at 09/06/14 0746 Last data filed at 09/06/14 0539  Gross per 24 hour  Intake    360 ml  Output   2800 ml  Net  -2440 ml     Sclerae unicteric, EOMs intact  Oropharynx clear  Lungs clear -- nauscultated anterolaterally  Heart regular rate and rhythm  Abdomen soft, obese, NT, +BS  MSK PICC in RUE, intact  Neuro nonfocal, well oriented, positive affect   CBG (last 3)  No results found for this basename: GLUCAP,  in the last 72 hours   Labs:  Lab Results  Component Value Date   WBC 8.0 09/03/2014   HGB 12.3 09/03/2014   HCT 38.1 09/03/2014   MCV 84.5 09/03/2014   PLT 374 09/03/2014   NEUTROABS 5.8 09/03/2014    @LASTCHEMISTRY @  Urine Studies No results found for this basename: UACOL, UAPR, USPG, UPH, UTP, UGL, UKET, UBIL, UHGB, UNIT, UROB, ULEU, UEPI, UWBC, URBC, UBAC, CAST, CRYS, UCOM, BILUA,  in the last 72 hours  Basic Metabolic Panel:  Recent Labs Lab 09/03/14 1135 09/06/14 0606  NA 141 142  K 4.1 3.9  CL  --  107  CO2 27 25  GLUCOSE 82 108*  BUN 12.8 10  CREATININE 0.8 0.73  CALCIUM 10.1 9.0   GFR Estimated Creatinine Clearance: 127 ml/min (by C-G formula based on Cr of 0.73). Liver Function Tests:  Recent Labs Lab 09/03/14 1135  AST 13  ALT 8  ALKPHOS 65  BILITOT 0.21  PROT 7.0  ALBUMIN 3.9   No results found for this basename: LIPASE, AMYLASE,  in the last 168 hours No results found for this basename:  AMMONIA,  in the last 168 hours Coagulation profile No results found for this basename: INR, PROTIME,  in the last 168 hours  CBC:  Recent Labs Lab 09/03/14 1136  WBC 8.0  NEUTROABS 5.8  HGB 12.3  HCT 38.1  MCV 84.5  PLT 374   Cardiac Enzymes: No results found for this basename: CKTOTAL, CKMB, CKMBINDEX, TROPONINI,  in the last 168 hours BNP: No components found with this basename: POCBNP,  CBG: No results found for this basename: GLUCAP,  in the last 168 hours D-Dimer No results found for this basename: DDIMER,  in the last 72 hours Hgb A1c No results found for this basename: HGBA1C,  in the last 72 hours Lipid Profile No results found for this basename: CHOL, HDL, LDLCALC, TRIG, CHOLHDL, LDLDIRECT,  in the last 72 hours Thyroid function studies No results found for this basename: TSH, T4TOTAL, FREET3, T3FREE, THYROIDAB,  in the last 72 hours Anemia work up No results found for this basename: VITAMINB12, FOLATE, FERRITIN, TIBC, IRON, RETICCTPCT,  in the last 72 hours Microbiology No results found for this or any previous visit (from the past 240 hour(s)).    Studies:  Nm Pet Image Initial (pi) Skull Base To Thigh  08/25/2014  CLINICAL DATA:  Initial treatment strategy for nodular lymphoma.  EXAM: NUCLEAR MEDICINE PET SKULL BASE TO THIGH  TECHNIQUE: 10.3 mCi F-18 FDG was injected intravenously. Full-ring PET imaging was performed from the skull base to thigh after the radiotracer. CT data was obtained and used for attenuation correction and anatomic localization.  FASTING BLOOD GLUCOSE:  Value: 88 mg/dl  COMPARISON:  None  FINDINGS: NECK  Bilateral palatine tonsil hypermetabolism which is favored to be physiologic. No correlate CT mass. This measures a S.U.V. max of 8.8.  CHEST  Right greater the left hypermetabolic breast masses. The right-sided mass measures a S.U.V. max of 12.9. On the order of 9.8 x 7.6 cm. Image 72.  ABDOMEN/PELVIS  Dominant abdominal pelvic  hypermetabolic mass. Superiorly, this measures 8.9 x 15.8 Cm. Inferiorly, 9.6 x 12.4 cm. This measures a S.U.V. max of 14.9.  SKELETON  Multi focal upper metabolic foci, consistent with lymphomatous marrow involvement. Index left humeral head lesion measures a S.U.V. max of 12.3. T8 hypermetabolic focus.  CT IMAGES PERFORMED FOR ATTENUATION CORRECTION  Mucosal thickening right maxillary sinus. No cervical adenopathy. No axillary adenopathy. Small retroperitoneal nodes, without retroperitoneal adenopathy.  No bowel obstruction. Small volume cul-de-sac fluid which could be physiologic or related to the abdominal pelvic mass. Uterus not well visualized.  IMPRESSION: 1. Markedly hypermetabolic active lymphoma within the breasts, abdomen/pelvis, and marrow space. 2. Likely physiologic palatine tonsil hypermetabolism.   Electronically Signed   By: Lindsey Irwin M.D.   On: 08/25/2014 15:52    ------------------------------------------------------------------- Transthoracic Echocardiography  Patient: Lindsey, Irwin MR #: [id number] Study Date: 09/04/2014 Gender: F Age: 18 Height: 167.6 cm Weight: 88.8 kg BSA: 2.06 m^2 Pt. Status: Room: [place], MD [place] ATTENDING Lindsey Irwin [id number] ADMITTING Lindsey Irwin ORDERING [place], [place] [id number]  cc:  ------------------------------------------------------------------- LV EF: 55% - 60%  ------------------  Assessment: 18 y.o. Lindsey Irwin woman with DLCL, B-cell; stage IV, intermediate IPI, currently day 2 EPOCH-R  (1) at risk for tumor lysis; s/p rasburicase 09/04/2014, on allopurinol  Results for Lindsey, Irwin (MRN [id number]) as of 09/06/2014 07:49  Ref. Range 09/03/2014 11:35 09/06/2014 06:06  Uric Acid, Serum Latest Range: 2.6-7.4 mg/dl 6.8 0.5 (L)  Results for Lindsey, Irwin (MRN [id number]) as of 09/06/2014 07:49  Ref. Range 08/15/2014 14:17 09/03/2014 11:36 09/06/2014 06:06  LDH Latest Range: 125-245  U/L 606 (H) 301 (H) 239  Results for Lindsey, Irwin (MRN [id number]) as of 09/06/2014 07:49  Ref. Range 08/15/2014 14:18 09/03/2014 11:35 09/06/2014 06:06  Creatinine Latest Range: 0.50-1.10 mg/dL 0.8 0.8 0.73  Results for Lindsey, Irwin (MRN [id number]) as of 09/06/2014 07:49  Ref. Range 08/15/2014 14:18 09/03/2014 11:35 09/06/2014 06:06  Potassium Latest Range: 3.5-5.1 mEq/L 3.9 4.1 3.9   (2) to receive rituximab next week, IT-MTX pending  Plan: she is tolerating treatment well; I have encouraged her to ambulate in halls and to report any symptoms; Will continue close monitoring. Full code.   Chauncey Cruel, MD 09/06/2014  7:46 AM

## 2014-09-07 LAB — LACTATE DEHYDROGENASE: LDH: 197 U/L (ref 94–250)

## 2014-09-07 LAB — BASIC METABOLIC PANEL
Anion gap: 12 (ref 5–15)
BUN: 7 mg/dL (ref 6–23)
CALCIUM: 8.7 mg/dL (ref 8.4–10.5)
CO2: 24 mEq/L (ref 19–32)
Chloride: 106 mEq/L (ref 96–112)
Creatinine, Ser: 0.59 mg/dL (ref 0.50–1.10)
Glucose, Bld: 97 mg/dL (ref 70–99)
Potassium: 3.6 mEq/L — ABNORMAL LOW (ref 3.7–5.3)
Sodium: 142 mEq/L (ref 137–147)

## 2014-09-07 LAB — URIC ACID: Uric Acid, Serum: 1 mg/dL — ABNORMAL LOW (ref 2.4–7.0)

## 2014-09-07 MED ORDER — POLYETHYLENE GLYCOL 3350 17 G PO PACK
17.0000 g | PACK | Freq: Every day | ORAL | Status: DC
  Administered 2014-09-07 – 2014-09-10 (×4): 17 g via ORAL
  Filled 2014-09-07 (×5): qty 1

## 2014-09-07 MED ORDER — DOCUSATE SODIUM 100 MG PO CAPS
200.0000 mg | ORAL_CAPSULE | Freq: Every day | ORAL | Status: DC
  Administered 2014-09-07 – 2014-09-11 (×5): 200 mg via ORAL
  Filled 2014-09-07 (×5): qty 2

## 2014-09-07 NOTE — Progress Notes (Signed)
Lindsey Irwin   DOB:[birth year]   FK#:[id number]   FVC#:[id number]  Subjective: currently day 3 EPOCH; ambulating "a lot"; denies N/V, mouth sores, h/a, visual changes, dizzyness, fatigue; diarrhea or dysuria; PICC "fine" but line in her L arm hurts; no rash, no bleeding, no pain, no fever to report; no BM in 2 days. Second young woman in bed and 3d on sofa under window; mood generally up   Objective: young African American woman examined in bed Filed Vitals:   09/07/14 0532  BP: 123/69  Pulse: 57  Temp: 97.6 F (36.4 C)  Resp: 16    Body mass index is 31.61 kg/(m^2).  Intake/Output Summary (Last 24 hours) at 09/07/14 0729 Last data filed at 09/07/14 0532  Gross per 24 hour  Intake   1320 ml  Output   3700 ml  Net  -2380 ml     Sclerae unictericintact  Oropharynx clear  Lungs clear -- auscultated anterolaterally  Heart regular rate and rhythm  Abdomen soft, obese, NT, +BS  MSK PICC in RUE, intact; heplock L arm no swelling or erythema  Neuro nonfocal, well oriented, positive affect   CBG (last 3)  No results found for this basename: GLUCAP,  in the last 72 hours   Labs:  Lab Results  Component Value Date   WBC 8.0 09/03/2014   HGB 12.3 09/03/2014   HCT 38.1 09/03/2014   MCV 84.5 09/03/2014   PLT 374 09/03/2014   NEUTROABS 5.8 09/03/2014    @LASTCHEMISTRY @  Urine Studies No results found for this basename: UACOL, UAPR, USPG, UPH, UTP, UGL, UKET, UBIL, UHGB, UNIT, UROB, ULEU, UEPI, UWBC, URBC, UBAC, CAST, CRYS, UCOM, BILUA,  in the last 72 hours  Basic Metabolic Panel:  Recent Labs Lab 09/06/14 0606 09/07/14 0630  NA 142 142  K 3.9 3.6*  CL 107 106  CO2 25 24  GLUCOSE 108* 97  BUN 10 7  CREATININE 0.73 0.59  CALCIUM 9.0 8.7   GFR Estimated Creatinine Clearance: 127 ml/min (by C-G formula based on Cr of 0.59). Liver Function Tests:  Recent Labs Lab 09/03/14 1135  AST 13  ALT 8  ALKPHOS 65  BILITOT 0.21  PROT 7.0  ALBUMIN 3.9   No results found for  this basename: LIPASE, AMYLASE,  in the last 168 hours No results found for this basename: AMMONIA,  in the last 168 hours Coagulation profile No results found for this basename: INR, PROTIME,  in the last 168 hours  CBC:  Recent Labs Lab 09/03/14 1136  WBC 8.0  NEUTROABS 5.8  HGB 12.3  HCT 38.1  MCV 84.5  PLT 374   Cardiac Enzymes: No results found for this basename: CKTOTAL, CKMB, CKMBINDEX, TROPONINI,  in the last 168 hours BNP: No components found with this basename: POCBNP,  CBG: No results found for this basename: GLUCAP,  in the last 168 hours D-Dimer No results found for this basename: DDIMER,  in the last 72 hours Hgb A1c No results found for this basename: HGBA1C,  in the last 72 hours Lipid Profile No results found for this basename: CHOL, HDL, LDLCALC, TRIG, CHOLHDL, LDLDIRECT,  in the last 72 hours Thyroid function studies No results found for this basename: TSH, T4TOTAL, FREET3, T3FREE, THYROIDAB,  in the last 72 hours Anemia work up No results found for this basename: VITAMINB12, FOLATE, FERRITIN, TIBC, IRON, RETICCTPCT,  in the last 72 hours Microbiology No results found for this or any previous visit (from the past 240 hour(s)).  Studies:  Nm Pet Image Initial (pi) Skull Base To Thigh  08/25/2014   CLINICAL DATA:  Initial treatment strategy for nodular lymphoma.  EXAM: NUCLEAR MEDICINE PET SKULL BASE TO THIGH  TECHNIQUE: 10.3 mCi F-18 FDG was injected intravenously. Full-ring PET imaging was performed from the skull base to thigh after the radiotracer. CT data was obtained and used for attenuation correction and anatomic localization.  FASTING BLOOD GLUCOSE:  Value: 88 mg/dl  COMPARISON:  None  FINDINGS: NECK  Bilateral palatine tonsil hypermetabolism which is favored to be physiologic. No correlate CT mass. This measures a S.U.V. max of 8.8.  CHEST  Right greater the left hypermetabolic breast masses. The right-sided mass measures a S.U.V. max of 12.9. On  the order of 9.8 x 7.6 cm. Image 72.  ABDOMEN/PELVIS  Dominant abdominal pelvic hypermetabolic mass. Superiorly, this measures 8.9 x 15.8 Cm. Inferiorly, 9.6 x 12.4 cm. This measures a S.U.V. max of 14.9.  SKELETON  Multi focal upper metabolic foci, consistent with lymphomatous marrow involvement. Index left humeral head lesion measures a S.U.V. max of 12.3. T8 hypermetabolic focus.  CT IMAGES PERFORMED FOR ATTENUATION CORRECTION  Mucosal thickening right maxillary sinus. No cervical adenopathy. No axillary adenopathy. Small retroperitoneal nodes, without retroperitoneal adenopathy.  No bowel obstruction. Small volume cul-de-sac fluid which could be physiologic or related to the abdominal pelvic mass. Uterus not well visualized.  IMPRESSION: 1. Markedly hypermetabolic active lymphoma within the breasts, abdomen/pelvis, and marrow space. 2. Likely physiologic palatine tonsil hypermetabolism.   Electronically Signed   By: Abigail Miyamoto M.D.   On: 08/25/2014 15:52    ------------------------------------------------------------------- Transthoracic Echocardiography  Patient: Amiel, Sharrow MR #: [id number] Study Date: 09/04/2014 Gender: F Age: 80 Height: 167.6 cm Weight: 88.8 kg BSA: 2.06 m^2 Pt. Status: Room: [place], MD [place] ATTENDING Barton Dubois [id number] ADMITTING Robbie Lis ORDERING [place], [place] [id number]  cc:  ------------------------------------------------------------------- LV EF: 55% - 60%  ------------------  Assessment: 18 y.o. [place] woman with DLCL, B-cell; stage IV, intermediate IPI, currently day 3 EPOCH-R  (1) at risk for tumor lysis; s/p rasburicase 09/04/2014, on allopurinol Results for AKHILA, MAHNKEN (MRN [id number]) as of 09/07/2014 07:36  Ref. Range 08/15/2014 14:17 09/03/2014 11:36 09/06/2014 06:06 09/07/2014 06:30  LDH Latest Range: 125-245 U/L 606 (H) 301 (H) 239 197  Results for AVIANNA, MOYNAHAN (MRN [id number]) as of  09/07/2014 07:36  Ref. Range 09/03/2014 11:35 09/06/2014 06:06 09/07/2014 06:30  Uric Acid, Serum Latest Range: 2.6-7.4 mg/dl 6.8 0.5 (L) 1.0 (L)  Results for ANICA, ALCARAZ (MRN [id number]) as of 09/07/2014 07:36  Ref. Range 08/15/2014 14:18 09/03/2014 11:35 09/06/2014 06:06 09/07/2014 06:30  Potassium Latest Range: 3.5-5.1 mEq/L 3.9 4.1 3.9 3.6 (L)     (2) to receive rituximab next week, IT-MTX pending  (3) Jehovah's Witness-- no transfulsions  Plan: she is tolerating treatment well; will remove L heplock as we have a 2-headed PICC or Right; will start bowel prophylaxis; will follow but not yet replace K+-- overall she is tolerating treatment fine. Full code.  Chauncey Cruel, MD 09/07/2014  7:29 AM

## 2014-09-07 NOTE — Progress Notes (Signed)
Progress Note   Lindsey Irwin NTI:[id number] DOB: 1996-10-14 DOA: 09/04/2014 PCP: No PCP Per Patient   Brief Narrative:   Lindsey Irwin is an 18 y.o. female with PMH of diffuse large cell lymphoma, significant weight loss who was admitted 09/04/14 for initiation of chemotherapy.  Assessment/Plan:   Principal Problem:   Diffuse large B cell lymphoma  Receiving chemo [facility]), day #3.  She will need prophylactic CNS treatment with intrathecal methotrexate.  Dr. Alvy Bimler following.  Monitor closely for tumor lysis. Continue allopurinol. LDH 197, uric acid 1.0, K 3.6.  Active Problems:   Patient is Jehovah's Witness  No transfusions, will need Procrit for severe anemia prophylaxis post chemo.    DVT Prophylaxis  Continue Lovenox as long as platelet count greater than 50,000.  Code Status: Full. Family Communication: Mother at beside 09/05/14.  Friend at bedside today. Disposition Plan: Home when stable.   IV Access:    PICC line placed 09/04/14   Procedures and diagnostic studies:   No results found.   Medical Consultants:    Dr. Heath Lark, Oncology.  Anti-Infectives:    None.  Subjective:   Lindsey Irwin remains without complaints of nausea, vomiting, shortness of breath, cough, pain, or diarrhea. States that her appetite is good.  Objective:    Filed Vitals:   09/06/14 0538 09/06/14 1430 09/06/14 2051 09/07/14 0532  BP: 126/51 134/63 132/59 123/69  Pulse: 81 82 68 57  Temp: 97.5 F (36.4 C) 97.9 F (36.6 C) 98.4 F (36.9 C) 97.6 F (36.4 C)  TempSrc: Oral Oral Oral Oral  Resp: 16 16 15 16   Height:      Weight:      SpO2: 99% 100% 100% 100%    Intake/Output Summary (Last 24 hours) at 09/07/14 0758 Last data filed at 09/07/14 0532  Gross per 24 hour  Intake   1320 ml  Output   3700 ml  Net  -2380 ml    Exam: Gen:  NAD Cardiovascular:  RRR, No M/R/G Respiratory:  Lungs CTAB Gastrointestinal:  Abdomen soft, NT/ND, + BS Extremities:   No C/E/C   Data Reviewed:    Labs: Basic Metabolic Panel:  Recent Labs Lab 09/06/14 0606 09/07/14 0630  NA 142 142  K 3.9 3.6*  CL 107 106  CO2 25 24  GLUCOSE 108* 97  BUN 10 7  CREATININE 0.73 0.59  CALCIUM 9.0 8.7   GFR Estimated Creatinine Clearance: 127 ml/min (by C-G formula based on Cr of 0.59). Liver Function Tests:  Recent Labs Lab 09/03/14 1135  AST 13  ALT 8  ALKPHOS 65  BILITOT 0.21  PROT 7.0  ALBUMIN 3.9    CBC:  Recent Labs Lab 09/03/14 1136  WBC 8.0  NEUTROABS 5.8  HGB 12.3  HCT 38.1  MCV 84.5  PLT 374   Microbiology No results found for this or any previous visit (from the past 240 hour(s)).   Medications:   . allopurinol  300 mg Oral Daily  . docusate sodium  200 mg Oral Daily  . DOXOrubicin/vinCRIStine/etoposide CHEMO IV infusion for Inpatient CI   Intravenous Q24H  . enoxaparin (LOVENOX) injection  40 mg Subcutaneous Q24H  . ondansetron (ZOFRAN) with dexamethasone (DECADRON) IV   Intravenous Q24H  . pantoprazole  40 mg Oral Q1200  . polyethylene glycol  17 g Oral Daily  . sodium chloride  10-40 mL Intracatheter Q12H   Continuous Infusions: . sodium chloride 20 mL/hr at 09/05/14 1429  . sodium chloride  125 mL/hr at 09/07/14 0011    Time spent: 15 minutes.   LOS: 3 days   [facility] Pager [phone]. If unable to reach me by pager, please call my cell phone at [phone].  *Please refer to [email or web address], password [email or web address] to get updated schedule on who will round on this patient, as hospitalists switch teams weekly. If 7PM-7AM, please contact night-coverage at [email or web address], password [email or web address] for any overnight needs.  09/07/2014, 7:58 AM

## 2014-09-08 LAB — BASIC METABOLIC PANEL
Anion gap: 11 (ref 5–15)
BUN: 8 mg/dL (ref 6–23)
CALCIUM: 8.7 mg/dL (ref 8.4–10.5)
CO2: 25 mEq/L (ref 19–32)
CREATININE: 0.61 mg/dL (ref 0.50–1.10)
Chloride: 102 mEq/L (ref 96–112)
Glucose, Bld: 116 mg/dL — ABNORMAL HIGH (ref 70–99)
Potassium: 3.6 mEq/L — ABNORMAL LOW (ref 3.7–5.3)
Sodium: 138 mEq/L (ref 137–147)

## 2014-09-08 LAB — URIC ACID: Uric Acid, Serum: 1.3 mg/dL — ABNORMAL LOW (ref 2.4–7.0)

## 2014-09-08 LAB — LACTATE DEHYDROGENASE: LDH: 165 U/L (ref 94–250)

## 2014-09-08 NOTE — Progress Notes (Addendum)
Progress Note   Lindsey Irwin ZOX:[id number] DOB: September 05, 1996 DOA: 09/04/2014 PCP: No PCP Per Patient   Brief Narrative:   Lindsey Irwin is an 18 y.o. female with PMH of diffuse large cell lymphoma, significant weight loss who was admitted 09/04/14 for initiation of chemotherapy, which she is tolerating well.  Assessment/Plan:   Principal Problem:   Diffuse large B cell lymphoma  Receiving chemo [facility]), day #4.  She will need prophylactic CNS treatment with intrathecal methotrexate.  Dr. Alvy Bimler following.  Monitor closely for tumor lysis. Continue allopurinol. LDH 165, uric acid 1.3, K 3.6.  Active Problems:   Patient is Jehovah's Witness  No transfusions, will need Procrit for severe anemia prophylaxis post chemo.    DVT Prophylaxis  Continue Lovenox as long as platelet count greater than 50,000.  Code Status: Full. Family Communication: Mother at beside 09/05/14.  Friend at bedside today. Disposition Plan: Home when stable.   IV Access:    PICC line placed 09/04/14   Procedures and diagnostic studies:   No results found.   Medical Consultants:    Dr. Heath Lark, Oncology.  Anti-Infectives:    None.  Subjective:   Lindsey Irwin remains without complaints of nausea, vomiting, shortness of breath, cough, pain, or diarrhea. States that her appetite is good.  Last BM earlier today.  Objective:    Filed Vitals:   09/07/14 0532 09/07/14 1405 09/07/14 2209 09/08/14 0540  BP: 123/69 132/72 138/68 104/66  Pulse: 57 67 60 61  Temp: 97.6 F (36.4 C) 98 F (36.7 C) 98.4 F (36.9 C) 97.6 F (36.4 C)  TempSrc: Oral Oral Oral Oral  Resp: 16 16 16 16   Height:      Weight:      SpO2: 100% 100% 100% 100%    Intake/Output Summary (Last 24 hours) at 09/08/14 0803 Last data filed at 09/08/14 0343  Gross per 24 hour  Intake   1560 ml  Output   4500 ml  Net  -2940 ml    Exam: Gen:  NAD Cardiovascular:  RRR, No M/R/G Respiratory:  Lungs  CTAB Gastrointestinal:  Abdomen soft, NT/ND, + BS Extremities:  No C/E/C   Data Reviewed:    Labs: Basic Metabolic Panel:  Recent Labs Lab 09/06/14 0606 09/07/14 0630 09/08/14 0623  NA 142 142 138  K 3.9 3.6* 3.6*  CL 107 106 102  CO2 25 24 25   GLUCOSE 108* 97 116*  BUN 10 7 8   CREATININE 0.73 0.59 0.61  CALCIUM 9.0 8.7 8.7   GFR Estimated Creatinine Clearance: 127 ml/min (by C-G formula based on Cr of 0.61). Liver Function Tests:  Recent Labs Lab 09/03/14 1135  AST 13  ALT 8  ALKPHOS 65  BILITOT 0.21  PROT 7.0  ALBUMIN 3.9    CBC:  Recent Labs Lab 09/03/14 1136  WBC 8.0  NEUTROABS 5.8  HGB 12.3  HCT 38.1  MCV 84.5  PLT 374   Microbiology No results found for this or any previous visit (from the past 240 hour(s)).   Medications:   . allopurinol  300 mg Oral Daily  . docusate sodium  200 mg Oral Daily  . DOXOrubicin/vinCRIStine/etoposide CHEMO IV infusion for Inpatient CI   Intravenous Q24H  . enoxaparin (LOVENOX) injection  40 mg Subcutaneous Q24H  . ondansetron (ZOFRAN) with dexamethasone (DECADRON) IV   Intravenous Q24H  . pantoprazole  40 mg Oral Q1200  . polyethylene glycol  17 g Oral Daily  . sodium chloride  10-40 mL Intracatheter Q12H   Continuous Infusions: . sodium chloride 20 mL/hr at 09/05/14 1429  . sodium chloride 125 mL/hr at 09/08/14 0742    Time spent: 15 minutes.   LOS: 4 days   [facility] Pager [phone]. If unable to reach me by pager, please call my cell phone at [phone].  *Please refer to [email or web address], password [email or web address] to get updated schedule on who will round on this patient, as hospitalists switch teams weekly. If 7PM-7AM, please contact night-coverage at [email or web address], password [email or web address] for any overnight needs.  09/08/2014, 8:03 AM

## 2014-09-08 NOTE — Progress Notes (Signed)
Lindsey Irwin   DOB:1996-10-19   KX#:[id number]    I have seen the patient, examined her and edited the notes as follows   Subjective: She feels well. Today is Day 4 EPOCH. She verbalizes no complaints. Denies nausea, vomiting, diarrhea or mucositis.Appetite is normal. Last bowel movement on 10/23. Denies any  headaches, vision changes or dizziness. Denies any rashes. PICC line non tender. Ambulating frequently without complications. No confusion reported.  Scheduled Meds: . allopurinol  300 mg Oral Daily  . docusate sodium  200 mg Oral Daily  . DOXOrubicin/vinCRIStine/etoposide CHEMO IV infusion for Inpatient CI   Intravenous Q24H  . enoxaparin (LOVENOX) injection  40 mg Subcutaneous Q24H  . ondansetron (ZOFRAN) with dexamethasone (DECADRON) IV   Intravenous Q24H  . pantoprazole  40 mg Oral Q1200  . polyethylene glycol  17 g Oral Daily  . sodium chloride  10-40 mL Intracatheter Q12H   Continuous Infusions: . sodium chloride 20 mL/hr at 09/05/14 1429  . sodium chloride 125 mL/hr at 09/08/14 0742   PRN Meds:.acetaminophen, furosemide, HYDROcodone-acetaminophen, ondansetron (ZOFRAN) IV, sodium chloride, sodium chloride, sodium chloride Objective:  Filed Vitals:   09/08/14 0540  BP: 104/66  Pulse: 61  Temp: 97.6 F (36.4 C)  Resp: 16     Intake/Output Summary (Last 24 hours) at 09/08/14 0738 Last data filed at 09/08/14 0343  Gross per 24 hour  Intake   1560 ml  Output   4500 ml  Net  -2940 ml    GENERAL:alert, no distress and comfortable SKIN: skin color, texture, turgor are normal, no rashes or significant lesions EYES: normal, Conjunctiva are pink and non-injected, sclera clear OROPHARYNX:no exudate, no erythema and lips, buccal mucosa, and tongue normal  NECK: supple, thyroid normal size, non-tender, without nodularity LYMPH:  no palpable lymphadenopathy in the cervical, axillary or inguinal LUNGS: clear to auscultation and percussion with normal breathing effort HEART:  regular rate & rhythm and no murmurs and no lower extremity edema ABDOMEN:abdomen soft, non-tender and normal bowel sounds Musculoskeletal:no cyanosis of digits and no clubbing. Right picc line without erythema. NEURO: alert & oriented x 3 with fluent speech, no focal motor/sensory deficits   Labs:  Lab Results  Component Value Date   WBC 8.0 09/03/2014   HGB 12.3 09/03/2014   HCT 38.1 09/03/2014   MCV 84.5 09/03/2014   PLT 374 09/03/2014   NEUTROABS 5.8 09/03/2014    Lab Results  Component Value Date   NA 138 09/08/2014   K 3.6* 09/08/2014   CL 102 09/08/2014   CO2 25 09/08/2014   Assessment & Plan:   #1 diffuse large B-cell lymphoma  She has extensive stage IV disease with high-intermediate prognostic score (IPI=3). Double hit mutation is suspected, and c-myc mutation has been requested on her breast tissue to be performed.  She was started on infusional EPOCH, D1C1 on 09/05/14 from Day 1-5 and q 21 days. Intent is curative Rituximab to be given this week on 10/28, to reduce risk of life-threatening allergic reaction.  She would also need prophylactic CNS treatment with intrathecal methotrexate this week, probably on 10/28.  To preserve her fertility, she was give one dose of Lupron on 09/05/14.   #2 tumor lysis prophylaxis  She received rasburicase on 09/05/14 and is currently on 125 ml IV fluids.  Please monitor fluid balance carefully. If she has over 1000 cc positive fluid balance, please administer 20 mg Lasix IV. It is currently on profile as prn.  She is on allopurinol 300 mg  BID;uric acid is 1.3 from 1.0 on 10/25. LDH today is 165 from 197 If there are signs of tumor lysis, please administer another 6 mg rasburicase IV, increase labs frequency monitoring and consider force diuresis.   #3 religious belief against transfusion support  To honor her Jorge Mandril will not receive blood transfusion. As soon as her treatment is completed, she will be started on Procrit  injection.   #4 DVT prophylaxis  She will be on Lovenox and we can discontinue that if her platelet count dropped to less than 50,000   #5 Constipation On laxatives since last night  #6 CODE STATUS Full code   Rondel Jumbo, PA-C 09/08/2014  7:38 AM Carlisa Eble, MD 09/08/2014

## 2014-09-09 ENCOUNTER — Telehealth: Payer: Self-pay | Admitting: Hematology and Oncology

## 2014-09-09 ENCOUNTER — Telehealth: Payer: Self-pay | Admitting: *Deleted

## 2014-09-09 ENCOUNTER — Other Ambulatory Visit: Payer: Self-pay | Admitting: Hematology and Oncology

## 2014-09-09 ENCOUNTER — Other Ambulatory Visit: Payer: Self-pay | Admitting: *Deleted

## 2014-09-09 DIAGNOSIS — E876 Hypokalemia: Secondary | ICD-10-CM

## 2014-09-09 DIAGNOSIS — D638 Anemia in other chronic diseases classified elsewhere: Secondary | ICD-10-CM

## 2014-09-09 DIAGNOSIS — C8330 Diffuse large B-cell lymphoma, unspecified site: Secondary | ICD-10-CM

## 2014-09-09 LAB — CBC
HEMATOCRIT: 34.7 % — AB (ref 36.0–46.0)
Hemoglobin: 11.6 g/dL — ABNORMAL LOW (ref 12.0–15.0)
MCH: 27.7 pg (ref 26.0–34.0)
MCHC: 33.4 g/dL (ref 30.0–36.0)
MCV: 82.8 fL (ref 78.0–100.0)
PLATELETS: 315 10*3/uL (ref 150–400)
RBC: 4.19 MIL/uL (ref 3.87–5.11)
RDW: 13.9 % (ref 11.5–15.5)
WBC: 8.6 10*3/uL (ref 4.0–10.5)

## 2014-09-09 LAB — BASIC METABOLIC PANEL
Anion gap: 10 (ref 5–15)
BUN: 9 mg/dL (ref 6–23)
CALCIUM: 9.2 mg/dL (ref 8.4–10.5)
CO2: 26 meq/L (ref 19–32)
CREATININE: 0.66 mg/dL (ref 0.50–1.10)
Chloride: 104 mEq/L (ref 96–112)
GFR calc Af Amer: >90 mL/min (ref >90)
GFR calc non Af Amer: >90 mL/min (ref >90)
GLUCOSE: 108 mg/dL — AB (ref 70–99)
Potassium: 3.7 mEq/L (ref 3.7–5.3)
Sodium: 140 mEq/L (ref 137–147)

## 2014-09-09 LAB — URIC ACID: Uric Acid, Serum: 1.6 mg/dL — ABNORMAL LOW (ref 2.4–7.0)

## 2014-09-09 LAB — LACTATE DEHYDROGENASE: LDH: 169 U/L (ref 94–250)

## 2014-09-09 MED ORDER — SODIUM CHLORIDE 0.9 % IV SOLN
750.0000 mg/m2 | Freq: Once | INTRAVENOUS | Status: AC
  Administered 2014-09-09: 1520 mg via INTRAVENOUS
  Filled 2014-09-09: qty 76

## 2014-09-09 MED ORDER — SODIUM CHLORIDE 0.9 % IV SOLN
Freq: Once | INTRAVENOUS | Status: AC
  Administered 2014-09-09: 16 mg via INTRAVENOUS
  Filled 2014-09-09 (×2): qty 8

## 2014-09-09 NOTE — Progress Notes (Signed)
Patient ID: Lindsey Irwin, female   DOB: 05-09-1996, 18 y.o.   MRN: [id number] [facility] PROGRESS NOTE  Jenisse Vullo WUJ:[id number] DOB: [birth year] DOA: 09/04/2014 PCP: No PCP Per Patient  Brief narrative: 18 y.o. female with PMH of diffuse large cell lymphoma, significant weight loss who was admitted 09/04/14 for initiation of chemotherapy, which she is tolerating well.    Assessment/Plan:   Principal Problem:  Diffuse large B cell lymphoma  Receiving chemo [facility]), day #5.  Patient will need prophylactic CNS treatment with intrathecal methotrexate.  Appreciate Dr. Alvy Bimler following.  No evidence of tumor lysis syndrome. Continue allopurinol. LDH 165, uric acid 1.3, K 3.6. Active Problems:  Patient is Jehovah's Witness / Anemia of chronic disease No transfusions, will need Procrit for severe anemia prophylaxis post chemo. Hypokalemia  Corrected, WNL. DVT Prophylaxis  Continue Lovenox as long as platelet count greater than 50,000. GI prophylaxis  Continue PPI therapy since patient is on decadron   Code Status: Full.  Family Communication: Mother at beside 09/05/14. Friend at bedside today.  Disposition Plan: Home when stable.    IV Access:   PICC line placed 09/04/14 Procedures and diagnostic studies:   No results found.  Medical Consultants:   Dr. Heath Lark, Oncology. Anti-Infectives:   None.   Leisa Lenz, MD  [facility] Pager [phone]  If 7PM-7AM, please contact night-coverage [email or web address] Password [facility] 09/09/2014, 3:47 PM   LOS: 5 days    HPI/Subjective: No acute overnight events.  Objective: Filed Vitals:   09/08/14 0540 09/08/14 1441 09/08/14 2030 09/09/14 1438  BP: 104/66 137/70 139/70 128/51  Pulse: 61 61 70 76  Temp: 97.6 F (36.4 C) 98.2 F (36.8 C) 98 F (36.7 C) 98.2 F (36.8 C)  TempSrc: Oral Oral Oral Oral  Resp: 16 18 18 16   Height:      Weight:      SpO2: 100% 100% 100% 100%    Intake/Output Summary (Last 24  hours) at 09/09/14 1547 Last data filed at 09/09/14 1438  Gross per 24 hour  Intake    360 ml  Output   3500 ml  Net  -3140 ml    Exam:   General:  Pt is alert, follows commands appropriately, not in acute distress  Cardiovascular: Regular rate and rhythm, S1/S2, no murmurs  Respiratory: Clear to auscultation bilaterally, no wheezing, no crackles, no rhonchi  Abdomen: Soft, non tender, non distended, bowel sounds present  Extremities: No edema, pulses DP and PT palpable bilaterally  Neuro: Grossly nonfocal  Data Reviewed: Basic Metabolic Panel:  Recent Labs Lab 09/06/14 0606 09/07/14 0630 09/08/14 0623 09/09/14 0535  NA 142 142 138 140  K 3.9 3.6* 3.6* 3.7  CL 107 106 102 104  CO2 25 24 25 26   GLUCOSE 108* 97 116* 108*  BUN 10 7 8 9   CREATININE 0.73 0.59 0.61 0.66  CALCIUM 9.0 8.7 8.7 9.2   Liver Function Tests:  Recent Labs Lab 09/03/14 1135  AST 13  ALT 8  ALKPHOS 65  BILITOT 0.21  PROT 7.0  ALBUMIN 3.9   No results found for this basename: LIPASE, AMYLASE,  in the last 168 hours No results found for this basename: AMMONIA,  in the last 168 hours CBC:  Recent Labs Lab 09/03/14 1136 09/09/14 0535  WBC 8.0 8.6  NEUTROABS 5.8  --   HGB 12.3 11.6*  HCT 38.1 34.7*  MCV 84.5 82.8  PLT 374 315   Cardiac Enzymes: No results found for this basename:  CKTOTAL, CKMB, CKMBINDEX, TROPONINI,  in the last 168 hours BNP: No components found with this basename: POCBNP,  CBG: No results found for this basename: GLUCAP,  in the last 168 hours  No results found for this or any previous visit (from the past 240 hour(s)).   Scheduled Meds: . allopurinol  300 mg Oral Daily  . cyclophosphamide  750 mg/m2 (Treatment Plan Actual) Intravenous Once  . docusate sodium  200 mg Oral Daily  . enoxaparin (LOVENOX) injection  40 mg Subcutaneous Q24H  . ondansetron (ZOFRAN) with dexamethasone (DECADRON) IV   Intravenous Once  . pantoprazole  40 mg Oral Q1200  .  polyethylene glycol  17 g Oral Daily   Continuous Infusions: . sodium chloride 20 mL/hr at 09/05/14 1429  . sodium chloride 50 mL/hr at 09/09/14 0935

## 2014-09-09 NOTE — Telephone Encounter (Signed)
Per staff message I have scheduled appts. Patient to receive appts with discharge summary. JMW

## 2014-09-09 NOTE — Progress Notes (Signed)
Lindsey Irwin   DOB:[birth year]   JA#:[id number]    I have seen the patient, examined her and edited the notes as follows  Subjective: She feels well. Afebrile.Today is Day 5 EPOCH. She verbalizes no complaints. Denies nausea, vomiting, diarrhea or mucositis. Appetite is normal. Last bowel movement on 10/26. Denies any headaches, vision changes or dizziness. Denies any rashes. PICC line non tender. Ambulating frequently without complications. No confusion reported.  Scheduled Meds: . allopurinol  300 mg Oral Daily  . docusate sodium  200 mg Oral Daily  . DOXOrubicin/vinCRIStine/etoposide CHEMO IV infusion for Inpatient CI   Intravenous Q24H  . enoxaparin (LOVENOX) injection  40 mg Subcutaneous Q24H  . pantoprazole  40 mg Oral Q1200  . polyethylene glycol  17 g Oral Daily  . sodium chloride  10-40 mL Intracatheter Q12H   Continuous Infusions: . sodium chloride 20 mL/hr at 09/05/14 1429  . sodium chloride 125 mL/hr at 09/08/14 1555   PRN Meds:.acetaminophen, furosemide, HYDROcodone-acetaminophen, ondansetron (ZOFRAN) IV, sodium chloride, sodium chloride, sodium chloride Objective:  Filed Vitals:   09/08/14 2030  BP: 139/70  Pulse: 70  Temp: 98 F (36.7 C)  Resp: 18     Intake/Output Summary (Last 24 hours) at 09/09/14 3419 Last data filed at 09/09/14 0500  Gross per 24 hour  Intake    360 ml  Output   2000 ml  Net  -1640 ml    GENERAL:alert, no distress and comfortable SKIN: skin color, texture, turgor are normal, no rashes or significant lesions EYES: normal, Conjunctiva are pink and non-injected, sclera clear OROPHARYNX:no exudate, no erythema and lips, buccal mucosa, and tongue normal  NECK: supple, thyroid normal size, non-tender, without nodularity LYMPH:  no palpable lymphadenopathy in the cervical, axillary or inguinal LUNGS: clear to auscultation and percussion with normal breathing effort HEART: regular rate & rhythm and no murmurs and no lower extremity  edema ABDOMEN:abdomen soft, non-tender and normal bowel sounds Musculoskeletal:no cyanosis of digits and no clubbing. Right picc line without erythema. NEURO: alert & oriented x 3 with fluent speech, no focal motor/sensory deficits   Labs:  Lab Results  Component Value Date   WBC 8.6 09/09/2014   HGB 11.6* 09/09/2014   HCT 34.7* 09/09/2014   MCV 82.8 09/09/2014   PLT 315 09/09/2014   NEUTROABS 5.8 09/03/2014    Lab Results  Component Value Date   NA 140 09/09/2014   K 3.7 09/09/2014   CL 104 09/09/2014   CO2 26 09/09/2014   Assessment & Plan:   #1 diffuse large B-cell lymphoma  She has extensive stage IV disease with high-intermediate prognostic score (IPI=3). Double hit mutation is suspected, and c-myc mutation has been requested on her breast tissue to be performed.  She was started on infusional EPOCH, D1C1 on 09/05/14 from Day 1-5 and q 21 days. Intent is curative Rituximab to be given on 10/28, to reduce risk of life-threatening allergic reaction.  She would also need prophylactic CNS treatment with intrathecal methotrexate this week, probably on 10/28.  To preserve her fertility, she was given one dose of Lupron on 09/05/14.   #2 tumor lysis prophylaxis  She received rasburicase on 09/05/14 and is currently on 125 ml IV fluids.  Please monitor fluid balance carefully. If she has over 1000 cc positive fluid balance, please administer 20 mg Lasix IV. It is currently on profile as prn.  She is on allopurinol 300 mg BID; uric acid is 1.6 from 1.3 on 10/26. LDH today is 169 from  165 She has no signs of tumor lysis syndrome so far. Will reduce IVF to 50 cc/hour   #3 religious belief against transfusion support  To honor her Jorge Mandril will not receive blood transfusion. As soon as her treatment is completed, she will be started on Procrit injection as out-patient  #4 DVT prophylaxis  She will be on Lovenox and we can discontinue that if her platelet count dropped to less  than 50,000   #5 Constipation resolved with laxatives  #6 CODE STATUS Full code  #7 Discharge planning DC home probably on 10/29 after completion of treatment  WERTMAN,[name], PA-C 09/09/2014  7:07 AM  [place], Jewelene Mairena, MD 09/09/2014

## 2014-09-09 NOTE — Telephone Encounter (Signed)
lvm for pt regarding to OCT and NOV appt.Marland KitchenMarland KitchenMarland Kitchen

## 2014-09-09 NOTE — Telephone Encounter (Signed)
added appt per staff...per staff MD will advised pt on appts

## 2014-09-10 ENCOUNTER — Ambulatory Visit: Payer: Medicaid Other

## 2014-09-10 LAB — BASIC METABOLIC PANEL
Anion gap: 11 (ref 5–15)
BUN: 11 mg/dL (ref 6–23)
CALCIUM: 9.6 mg/dL (ref 8.4–10.5)
CHLORIDE: 101 meq/L (ref 96–112)
CO2: 28 meq/L (ref 19–32)
Creatinine, Ser: 0.61 mg/dL (ref 0.50–1.10)
GFR calc Af Amer: >90 mL/min (ref >90)
GFR calc non Af Amer: >90 mL/min (ref >90)
Glucose, Bld: 119 mg/dL — ABNORMAL HIGH (ref 70–99)
Potassium: 3.8 mEq/L (ref 3.7–5.3)
Sodium: 140 mEq/L (ref 137–147)

## 2014-09-10 LAB — CSF CELL COUNT WITH DIFFERENTIAL
RBC Count, CSF: 0 /mm3
Tube #: 3
WBC, CSF: 1 /mm3 (ref 0–5)

## 2014-09-10 LAB — IRON AND TIBC
Iron: 234 ug/dL — ABNORMAL HIGH (ref 42–135)
Saturation Ratios: 84 % — ABNORMAL HIGH (ref 20–55)
TIBC: 277 ug/dL (ref 250–470)
UIBC: 43 ug/dL — AB (ref 125–400)

## 2014-09-10 LAB — PROTEIN AND GLUCOSE, CSF
GLUCOSE CSF: 74 mg/dL (ref 43–76)
TOTAL PROTEIN, CSF: 50 mg/dL — AB (ref 15–45)

## 2014-09-10 LAB — LACTATE DEHYDROGENASE: LDH: 164 U/L (ref 94–250)

## 2014-09-10 LAB — VITAMIN B12: VITAMIN B 12: 539 pg/mL (ref 211–911)

## 2014-09-10 LAB — FERRITIN: Ferritin: 156 ng/mL (ref 10–291)

## 2014-09-10 LAB — URIC ACID: Uric Acid, Serum: 1.9 mg/dL — ABNORMAL LOW (ref 2.4–7.0)

## 2014-09-10 MED ORDER — SODIUM CHLORIDE 0.9 % IV SOLN
INTRAVENOUS | Status: AC
  Administered 2014-09-10: 12:00:00 via INTRAVENOUS

## 2014-09-10 MED ORDER — ACETAMINOPHEN 325 MG PO TABS
650.0000 mg | ORAL_TABLET | Freq: Once | ORAL | Status: AC
  Administered 2014-09-10: 650 mg via ORAL
  Filled 2014-09-10: qty 2

## 2014-09-10 MED ORDER — EPINEPHRINE HCL 1 MG/ML IJ SOLN
0.5000 mg | Freq: Once | INTRAMUSCULAR | Status: AC | PRN
  Filled 2014-09-10: qty 1

## 2014-09-10 MED ORDER — FAMOTIDINE IN NACL 20-0.9 MG/50ML-% IV SOLN
20.0000 mg | Freq: Once | INTRAVENOUS | Status: AC | PRN
  Filled 2014-09-10: qty 50

## 2014-09-10 MED ORDER — SODIUM CHLORIDE 0.9 % IJ SOLN: 3.0000 mL | INTRAMUSCULAR | Status: DC | PRN

## 2014-09-10 MED ORDER — HEPARIN SOD (PORK) LOCK FLUSH 100 UNIT/ML IV SOLN: 250.0000 [IU] | Freq: Once | INTRAVENOUS | Status: AC | PRN

## 2014-09-10 MED ORDER — SODIUM CHLORIDE 0.9 % IJ SOLN: 10.0000 mL | INTRAMUSCULAR | Status: DC | PRN

## 2014-09-10 MED ORDER — DIPHENHYDRAMINE HCL 50 MG/ML IJ SOLN: 25.0000 mg | Freq: Once | INTRAMUSCULAR | Status: AC | PRN

## 2014-09-10 MED ORDER — EPINEPHRINE HCL 0.1 MG/ML IJ SOSY
0.2500 mg | PREFILLED_SYRINGE | Freq: Once | INTRAMUSCULAR | Status: AC | PRN
  Filled 2014-09-10: qty 10

## 2014-09-10 MED ORDER — HEPARIN SOD (PORK) LOCK FLUSH 100 UNIT/ML IV SOLN: 500.0000 [IU] | Freq: Once | INTRAVENOUS | Status: AC | PRN

## 2014-09-10 MED ORDER — SODIUM CHLORIDE 0.9 % IV SOLN: Freq: Once | INTRAVENOUS | Status: DC

## 2014-09-10 MED ORDER — SODIUM CHLORIDE 0.9 % IV SOLN: Freq: Once | INTRAVENOUS | Status: AC | PRN

## 2014-09-10 MED ORDER — DIPHENHYDRAMINE HCL 50 MG PO CAPS
50.0000 mg | ORAL_CAPSULE | Freq: Once | ORAL | Status: AC
  Administered 2014-09-10: 50 mg via ORAL
  Filled 2014-09-10: qty 1

## 2014-09-10 MED ORDER — DIPHENHYDRAMINE HCL 50 MG/ML IJ SOLN: 50.0000 mg | Freq: Once | INTRAMUSCULAR | Status: AC | PRN

## 2014-09-10 MED ORDER — METHYLPREDNISOLONE SODIUM SUCC 125 MG IJ SOLR
125.0000 mg | Freq: Once | INTRAMUSCULAR | Status: AC | PRN
  Filled 2014-09-10: qty 2

## 2014-09-10 MED ORDER — SODIUM CHLORIDE 0.9 % IJ SOLN
Freq: Once | INTRAMUSCULAR | Status: AC
  Administered 2014-09-10: 14:00:00 via INTRATHECAL
  Filled 2014-09-10 (×2): qty 0.48

## 2014-09-10 MED ORDER — ALTEPLASE 2 MG IJ SOLR
2.0000 mg | Freq: Once | INTRAMUSCULAR | Status: AC | PRN
  Filled 2014-09-10: qty 2

## 2014-09-10 MED ORDER — SODIUM CHLORIDE 0.9 % IV SOLN
375.0000 mg/m2 | Freq: Once | INTRAVENOUS | Status: AC
  Administered 2014-09-10: 800 mg via INTRAVENOUS
  Filled 2014-09-10: qty 80

## 2014-09-10 MED ORDER — ALBUTEROL SULFATE (2.5 MG/3ML) 0.083% IN NEBU: 2.5000 mg | INHALATION_SOLUTION | Freq: Once | RESPIRATORY_TRACT | Status: AC | PRN

## 2014-09-10 NOTE — Progress Notes (Signed)
Lindsey Irwin   DOB:January 21, 1996   PJ#:[id number]    I have seen the patient, examined her and edited the notes as follows  Subjective: She feels well. Afebrile. She tolerated Day 5 EPOCH. eager to receive Rituxan today. She verbalizes no complaints. She denies any shortness of breath or chest pain. Denies nausea, vomiting, diarrhea or mucositis. Appetite is normal. Last bowel movement on 10/27. Denies any headaches, vision changes or dizziness. Denies any rashes, numbness or tingling. PICC line non tender. Ambulating frequently without assistance. No confusion reported.  Scheduled Meds: . allopurinol  300 mg Oral Daily  . docusate sodium  200 mg Oral Daily  . enoxaparin (LOVENOX) injection  40 mg Subcutaneous Q24H  . pantoprazole  40 mg Oral Q1200  . polyethylene glycol  17 g Oral Daily  . sodium chloride  10-40 mL Intracatheter Q12H   Continuous Infusions: . sodium chloride 20 mL/hr at 09/05/14 1429  . sodium chloride 50 mL/hr at 09/09/14 0935   PRN Meds:.acetaminophen, furosemide, HYDROcodone-acetaminophen, ondansetron (ZOFRAN) IV, sodium chloride, sodium chloride, sodium chloride Objective:  Filed Vitals:   09/10/14 0536  BP: 129/63  Pulse: 62  Temp: 98.2 F (36.8 C)  Resp: 16     Intake/Output Summary (Last 24 hours) at 09/10/14 5809 Last data filed at 09/10/14 0553  Gross per 24 hour  Intake    960 ml  Output   4300 ml  Net  -3340 ml    GENERAL:alert, no distress and comfortable SKIN: skin color, texture, turgor are normal, no rashes or significant lesions EYES: normal, Conjunctiva are pink and non-injected, sclera clear OROPHARYNX:no exudate, no erythema and lips, buccal mucosa, and tongue normal  NECK: supple, thyroid normal size, non-tender, without nodularity LYMPH:  no palpable lymphadenopathy in the cervical, axillary or inguinal LUNGS: clear to auscultation and percussion with normal breathing effort HEART: regular rate & rhythm and no murmurs and no lower extremity  edema ABDOMEN:abdomen soft, non-tender and normal bowel sounds Musculoskeletal:no cyanosis of digits and no clubbing. Right picc line without erythema. NEURO: alert & oriented x 3 with fluent speech, no focal motor/sensory deficits   Labs:  Lab Results  Component Value Date   WBC 8.6 09/09/2014   HGB 11.6* 09/09/2014   HCT 34.7* 09/09/2014   MCV 82.8 09/09/2014   PLT 315 09/09/2014   NEUTROABS 5.8 09/03/2014    Lab Results  Component Value Date   NA 140 09/10/2014   K 3.8 09/10/2014   CL 101 09/10/2014   CO2 28 09/10/2014   Assessment & Plan:   #1 diffuse large B-cell lymphoma  She has extensive stage IV disease with high-intermediate prognostic score (IPI=3). Double hit mutation is suspected, and c-myc mutation has been requested on her breast tissue to be performed.  She was started on infusional EPOCH, D1C1 on 09/05/14 from Day 1-5 and q 21 days with good tolerance. Intent is curative Rituximab to be given on 10/28 at slow infusion to reduce risk of life-threatening allergic reaction.  She would also need diagnostic lumbar puncture and prophylactic CNS treatment with intrathecal methotrexate today, to be performed at 130 pm today. To preserve her fertility, she was given one dose of Lupron on 09/05/14.   #2 tumor lysis prophylaxis  She received rasburicase on 09/05/14 and is currently on 125 ml IV fluids.  Please monitor fluid balance carefully. If she has over 1000 cc positive fluid balance, please administer 20 mg Lasix IV. It is currently on profile as prn.  She is on  allopurinol 300 mg BID; uric acid is 1.9 from 1.6 on 10/27. LDH today is 164 from 169 with Potassium at 3.8 She has no signs of tumor lysis syndrome so far. Her IVFs were reduced to 50 cc/hour on 10/27   #3 religious belief against transfusion support  To honor her Jorge Mandril will not receive blood transfusion. As soon as her treatment is completed, she will be started on Procrit injection as  outpatient  #4 DVT prophylaxis  She will be on Lovenox and we can discontinue that if her platelet count dropped to less than 50,000   #5 Constipation resolved with laxatives  #6 CODE STATUS Full code  #7 Discharge planning DC home probably on 10/29 after completion of treatment  WERTMAN,[name], PA-C 09/10/2014  7:02 AM [place], Derius Ghosh, MD 09/10/2014

## 2014-09-10 NOTE — Procedures (Signed)
Diagnostic Lumbar Puncture and Intrathecal administration of chemotherapy Procedure Note   Informed consent was obtained and potential risks including bleeding, infection and pain were reviewed with the patient.  The patient's name, date of birth, identification, consent and allergies were verified prior to the start of procedure and time out was performed.  The skin was prepped with Betadine solution.   5 cc of 1% lidocaine was used to provide local anaesthesia.   The L3/L4 intrathecal space was chosen as the site of procedure.  5 cc of clear CSF was obtained.  Next, 5 ml/12 mg methotrexate was administered into the intrathecal space.  The procedure was tolerated well and there were no complications.  The patient was stable at the end of the procedure.  Specimens sent for cell count, protein and cytology to rule out lymphoma.

## 2014-09-10 NOTE — Progress Notes (Signed)
Patient ID: Lindsey Irwin, female   DOB: December 09, 1995, 18 y.o.   MRN: [id number] [facility] PROGRESS NOTE  Lindsey Irwin FHL:[id number] DOB: 30-May-1996 DOA: 09/04/2014 PCP: No PCP Per Patient  Brief narrative: 18 y.o. female with PMH of diffuse large cell lymphoma, significant weight loss who was admitted 09/04/14 for initiation of chemotherapy, which she is tolerating well.   Assessment/Plan:   Principal Problem:  Diffuse large B cell lymphoma   Receiving chemo [facility]), day #6.   Plan for prophylactic CNS treatment with intrathecal methotrexate today.   Appreciate Dr. Alvy Bimler following.   No evidence of tumor lysis syndrome. Continue allopurinol. LDH 165, uric acid 1.3, K 3.6. Active Problems:  Patient is Jehovah's Witness / Anemia of chronic disease   No transfusions, will need Procrit for severe anemia prophylaxis post chemo. Hypokalemia   Corrected, WNL. DVT Prophylaxis   Continue Lovenox. Platelet count WNL. GI prophylaxis   Continue PPI therapy since patient is on decadron   Code Status: Full.  Family Communication: Mother at beside 09/05/14. Friend at bedside today.  Disposition Plan: Home when stable.    IV Access:   PICC line placed 09/04/14 Procedures and diagnostic studies:   No results found.  Medical Consultants:   Dr. Heath Lark, Oncology. Anti-Infectives:   None.   Leisa Lenz, MD  [facility] Pager [phone]  If 7PM-7AM, please contact night-coverage [email or web address] Password [facility] 09/10/2014, 3:54 PM   LOS: 6 days    HPI/Subjective: No acute overnight events.  Objective: Filed Vitals:   09/10/14 0536 09/10/14 0901 09/10/14 1026 09/10/14 1441  BP: 129/63 128/69 132/60 106/49  Pulse: 62 48 61 74  Temp: 98.2 F (36.8 C) 98.1 F (36.7 C) 98.4 F (36.9 C) 99.2 F (37.3 C)  TempSrc:  Oral Oral Oral  Resp: 16 16 15 16   Height:      Weight:      SpO2: 100% 99% 100% 100%    Intake/Output Summary (Last 24 hours) at 09/10/14  1554 Last data filed at 09/10/14 1511  Gross per 24 hour  Intake   5806 ml  Output   3600 ml  Net   2206 ml    Exam:   General:  Pt is alert, follows commands appropriately, not in acute distress  Cardiovascular: Regular rate and rhythm, S1/S2, no murmurs  Respiratory: Clear to auscultation bilaterally, no wheezing, no crackles, no rhonchi  Abdomen: Soft, non tender, non distended, bowel sounds present  Extremities: No edema, pulses DP and PT palpable bilaterally  Neuro: Grossly nonfocal  Data Reviewed: Basic Metabolic Panel:  Recent Labs Lab 09/06/14 0606 09/07/14 0630 09/08/14 0623 09/09/14 0535 09/10/14 0557  NA 142 142 138 140 140  K 3.9 3.6* 3.6* 3.7 3.8  CL 107 106 102 104 101  CO2 25 24 25 26 28   GLUCOSE 108* 97 116* 108* 119*  BUN 10 7 8 9 11   CREATININE 0.73 0.59 0.61 0.66 0.61  CALCIUM 9.0 8.7 8.7 9.2 9.6   Liver Function Tests: No results found for this basename: AST, ALT, ALKPHOS, BILITOT, PROT, ALBUMIN,  in the last 168 hours No results found for this basename: LIPASE, AMYLASE,  in the last 168 hours No results found for this basename: AMMONIA,  in the last 168 hours CBC:  Recent Labs Lab 09/09/14 0535  WBC 8.6  HGB 11.6*  HCT 34.7*  MCV 82.8  PLT 315   Cardiac Enzymes: No results found for this basename: CKTOTAL, CKMB, CKMBINDEX, TROPONINI,  in the last  168 hours BNP: No components found with this basename: POCBNP,  CBG: No results found for this basename: GLUCAP,  in the last 168 hours  No results found for this or any previous visit (from the past 240 hour(s)).   Scheduled Meds: . allopurinol  300 mg Oral Daily  . docusate sodium  200 mg Oral Daily  . enoxaparin (LOVENOX) injection  40 mg Subcutaneous Q24H  . pantoprazole  40 mg Oral Q1200  . polyethylene glycol  17 g Oral Daily  . sodium chloride  10-40 mL Intracatheter Q12H   Continuous Infusions: . sodium chloride 20 mL/hr at 09/05/14 1429  . sodium chloride 50 mL/hr at  09/10/14 1509

## 2014-09-11 ENCOUNTER — Other Ambulatory Visit: Payer: Self-pay | Admitting: Hematology and Oncology

## 2014-09-11 ENCOUNTER — Encounter: Payer: Self-pay | Admitting: *Deleted

## 2014-09-11 DIAGNOSIS — C8330 Diffuse large B-cell lymphoma, unspecified site: Secondary | ICD-10-CM

## 2014-09-11 LAB — BASIC METABOLIC PANEL
ANION GAP: 8 (ref 5–15)
BUN: 10 mg/dL (ref 6–23)
CHLORIDE: 108 meq/L (ref 96–112)
CO2: 27 mEq/L (ref 19–32)
Calcium: 8.1 mg/dL — ABNORMAL LOW (ref 8.4–10.5)
Creatinine, Ser: 0.59 mg/dL (ref 0.50–1.10)
GFR calc Af Amer: >90 mL/min (ref >90)
GFR calc non Af Amer: >90 mL/min (ref >90)
GLUCOSE: 87 mg/dL (ref 70–99)
Potassium: 3.6 mEq/L — ABNORMAL LOW (ref 3.7–5.3)
Sodium: 143 mEq/L (ref 137–147)

## 2014-09-11 LAB — ERYTHROPOIETIN: ERYTHROPOIETIN: 25.9 m[IU]/mL — AB (ref 2.6–18.5)

## 2014-09-11 LAB — TISSUE HYBRIDIZATION TO NCBH

## 2014-09-11 LAB — URIC ACID: URIC ACID, SERUM: 2 mg/dL — AB (ref 2.4–7.0)

## 2014-09-11 LAB — LACTATE DEHYDROGENASE: LDH: 140 U/L (ref 94–250)

## 2014-09-11 MED ORDER — PROCHLORPERAZINE MALEATE 10 MG PO TABS: 10.0000 mg | ORAL_TABLET | Freq: Four times a day (QID) | ORAL | Status: DC | PRN

## 2014-09-11 MED ORDER — ONDANSETRON HCL 8 MG PO TABS: 8.0000 mg | ORAL_TABLET | Freq: Three times a day (TID) | ORAL | Status: DC | PRN

## 2014-09-11 MED ORDER — HEPARIN SOD (PORK) LOCK FLUSH 100 UNIT/ML IV SOLN
250.0000 [IU] | INTRAVENOUS | Status: AC | PRN
  Administered 2014-09-11: 250 [IU]
  Filled 2014-09-11: qty 5

## 2014-09-11 MED ORDER — DSS 100 MG PO CAPS: 200.0000 mg | ORAL_CAPSULE | Freq: Every day | ORAL | Status: DC

## 2014-09-11 MED ORDER — PANTOPRAZOLE SODIUM 40 MG PO TBEC: 40.0000 mg | DELAYED_RELEASE_TABLET | Freq: Every day | ORAL | Status: DC

## 2014-09-11 MED ORDER — ALLOPURINOL 300 MG PO TABS: 300.0000 mg | ORAL_TABLET | Freq: Every day | ORAL | Status: DC

## 2014-09-11 NOTE — Progress Notes (Signed)
Lindsey Irwin   DOB:[birth year]   WU#:[id number]    I have seen the patient, examined her and edited the notes as follows  Subjective: She feels well. Afebrile. She received Rituxan on 10/28 and intrathecal methotrexate without complications. Denies nausea, vomiting, diarrhea or mucositis. Appetite is normal. Last bowel movement on 10/28. Denies any headaches, vision changes or dizziness. Denies any rashes. PICC line non tender. Ambulating frequently. No confusion reported.  Scheduled Meds: . allopurinol  300 mg Oral Daily  . docusate sodium  200 mg Oral Daily  . enoxaparin (LOVENOX) injection  40 mg Subcutaneous Q24H  . pantoprazole  40 mg Oral Q1200  . polyethylene glycol  17 g Oral Daily  . sodium chloride  10-40 mL Intracatheter Q12H   Continuous Infusions: . sodium chloride 20 mL/hr at 09/05/14 1429  . sodium chloride 50 mL/hr at 09/11/14 0009   PRN Meds:.acetaminophen, furosemide, HYDROcodone-acetaminophen, ondansetron (ZOFRAN) IV, sodium chloride, sodium chloride, sodium chloride, sodium chloride, sodium chloride Objective:  Filed Vitals:   09/11/14 0205  BP: 101/67  Pulse: 80  Temp: 97.9 F (36.6 C)  Resp: 20     Intake/Output Summary (Last 24 hours) at 09/11/14 0706 Last data filed at 09/11/14 0600  Gross per 24 hour  Intake   5686 ml  Output   2200 ml  Net   3486 ml    GENERAL:alert, no distress and comfortable SKIN: skin color, texture, turgor are normal, no rashes or significant lesions EYES: normal, Conjunctiva are pink and non-injected, sclera clear OROPHARYNX:no exudate, no erythema and lips, buccal mucosa, and tongue normal  NECK: supple, thyroid normal size, non-tender, without nodularity LYMPH:  no palpable lymphadenopathy in the cervical, axillary or inguinal LUNGS: clear to auscultation and percussion with normal breathing effort HEART: regular rate & rhythm and no murmurs and no lower extremity edema ABDOMEN:abdomen soft, non-tender and normal bowel  sounds Musculoskeletal:no cyanosis of digits and no clubbing. Right picc line without erythema. NEURO: alert & oriented x 3 with fluent speech, no focal motor/sensory deficits   Labs:  Lab Results  Component Value Date   WBC 8.6 09/09/2014   HGB 11.6* 09/09/2014   HCT 34.7* 09/09/2014   MCV 82.8 09/09/2014   PLT 315 09/09/2014   NEUTROABS 5.8 09/03/2014    Lab Results  Component Value Date   NA 143 09/11/2014   K 3.6* 09/11/2014   CL 108 09/11/2014   CO2 27 09/11/2014   Assessment & Plan:   #1 diffuse large B-cell lymphoma  She has extensive stage IV disease with high-intermediate prognostic score (IPI=3). Double hit mutation is suspected, and c-myc mutation has been requested on her breast tissue to be performed.  She was started on infusional EPOCH, D1C1 on 09/05/14 from Day 1-5 and q 21 days. Intent is curative Rituximab, C1 D1 was given on 10/28, to reduce risk of life-threatening allergic reaction.  She also received prophylactic CNS treatment with intrathecal methotrexate on 29/56 without complications To preserve her fertility, she was given one dose of Lupron on 09/05/14.   #2 tumor lysis prophylaxis  She received rasburicase on 09/05/14 and is currently on 125 ml IV fluids.  Please monitor fluid balance carefully. If she has over 1000 cc positive fluid balance, please administer 20 mg Lasix IV. It is currently on profile as prn.  She is on allopurinol 300 mg BID; uric acid is 2.0 from 1.6 on 10/28. LDH today is 140 from 169 She has no signs of tumor lysis syndrome so far.  Can DC IVF and I changed allopurinol to daily   #3 religious belief against transfusion support  To honor her Jorge Mandril will not receive blood transfusion. As soon as her treatment is completed, she will be started on Procrit injection as outpatient  #4 DVT prophylaxis  She will be on Lovenox and we can discontinue that if her platelet count dropped to less than 50,000   #5 Constipation resolved  with laxatives  #6 CODE STATUS Full code  #7 Discharge planning DC home today, follow-up appointment has been arranged.  Rondel Jumbo, PA-C 09/11/2014  7:05 AM Rosana Farnell, MD 09/11/2014

## 2014-09-11 NOTE — Discharge Summary (Signed)
Physician Discharge Summary  Lindsey Irwin DXA:[id number] DOB: Jul 10, 1996 DOA: 09/04/2014  PCP: No PCP Per Patient  Admit date: 09/04/2014 Discharge date: 09/11/2014  Recommendations for Outpatient Follow-up:  1. Patient will follow up with oncology per scheduled appointment.  Discharge Diagnoses:  Principal Problem:   Diffuse large B cell lymphoma Active Problems:   Patient is Jehovah's Witness    Discharge Condition: stable   Diet recommendation: as tolerated   History of present illness:  18 y.o. female with PMH of diffuse large cell lymphoma, significant weight loss who was admitted 09/04/14 for initiation of chemotherapy, which she is tolerating well.   Assessment/Plan:   Principal Problem:  Diffuse large B cell lymphoma   pt has extensive stage IV disease with high-intermediate prognostic score  Started on infusional EPOCH, D1C1 on 09/05/14 from Day 1-5 and q 21 days. Intent is curative. Rituximab, C1 D1 was given on 10/28, to reduce risk of life-threatening allergic reaction. She also received prophylactic CNS treatment with intrathecal methotrexate on 20/94 without complications. To preserve her fertility, she was given one dose of Lupron on 09/05/14.  No evidence of tumor lysis syndrome. Continue allopurinol. LDH 165, uric acid 1.3, K 3.6. Active Problems:  Patient is Jehovah's Witness / Anemia of chronic disease  No transfusions, will need Procrit for severe anemia prophylaxis post chemo. Hypokalemia  Corrected, WNL. DVT Prophylaxis  Continue Lovenox while pt is in hospital. Platelet count WNL. GI prophylaxis  Continue PPI therapy since patient was on decadron   Code Status: Full.  Family Communication: Mother at beside 09/05/14. Friend at bedside today.   IV Access:   PICC line placed 09/04/14 Procedures and diagnostic studies:   No results found.  Medical Consultants:   Dr. Heath Lark, Oncology. Anti-Infectives:   None.   SignedLeisa Lenz,  MD  [facility] 09/11/2014, 10:49 AM  Pager #: [phone]  Discharge Exam: Filed Vitals:   09/11/14 0658  BP: 110/53  Pulse: 65  Temp: 98.2 F (36.8 C)  Resp: 16   Filed Vitals:   09/10/14 1441 09/10/14 2205 09/11/14 0205 09/11/14 0658  BP: 106/49 105/79 101/67 110/53  Pulse: 74 86 80 65  Temp: 99.2 F (37.3 C) 98.7 F (37.1 C) 97.9 F (36.6 C) 98.2 F (36.8 C)  TempSrc: Oral Oral Oral Oral  Resp: 16 20 20 16   Height:      Weight:      SpO2: 100% 100% 99% 94%    General: Pt is alert, follows commands appropriately, not in acute distress Cardiovascular: Regular rate and rhythm, S1/S2 +, no murmurs Respiratory: Clear to auscultation bilaterally, no wheezing, no crackles, no rhonchi Abdominal: Soft, non tender, non distended, bowel sounds +, no guarding Extremities: no edema, no cyanosis, pulses palpable bilaterally DP and PT Neuro: Grossly nonfocal  Discharge Instructions  Discharge Instructions   Call MD for:  difficulty breathing, headache or visual disturbances    Complete by:  As directed      Call MD for:  persistant dizziness or light-headedness    Complete by:  As directed      Call MD for:  persistant nausea and vomiting    Complete by:  As directed      Call MD for:  severe uncontrolled pain    Complete by:  As directed      Diet - low sodium heart healthy    Complete by:  As directed      Increase activity slowly    Complete by:  As directed             Medication List         acetaminophen 500 MG tablet  Commonly known as:  TYLENOL  Take 500 mg by mouth every 6 (six) hours as needed for headache.     allopurinol 300 MG tablet  Commonly known as:  ZYLOPRIM  Take 1 tablet (300 mg total) by mouth daily.     DSS 100 MG Caps  Take 200 mg by mouth daily.     HYDROcodone-acetaminophen 5-325 MG per tablet  Commonly known as:  NORCO/VICODIN  Take 1 tablet by mouth every 4 (four) hours as needed.     ondansetron 8 MG tablet  Commonly  known as:  ZOFRAN  Take 1 tablet (8 mg total) by mouth every 8 (eight) hours as needed for nausea.     pantoprazole 40 MG tablet  Commonly known as:  PROTONIX  Take 1 tablet (40 mg total) by mouth daily at 12 noon.     prochlorperazine 10 MG tablet  Commonly known as:  COMPAZINE  Take 1 tablet (10 mg total) by mouth every 6 (six) hours as needed for nausea.           Follow-up Information   Follow up with [facility] NI, MD. (Follow up appt after recent hospitalization; Dr. Alvy Bimler to scheduel follow up appt)    Specialty:  Hematology and Oncology   Contact information:   [place] [address] [phone]        The results of significant diagnostics from this hospitalization (including imaging, microbiology, ancillary and laboratory) are listed below for reference.    Significant Diagnostic Studies: Nm Pet Image Initial (pi) Skull Base To Thigh  08/25/2014   CLINICAL DATA:  Initial treatment strategy for nodular lymphoma.  EXAM: NUCLEAR MEDICINE PET SKULL BASE TO THIGH  TECHNIQUE: 10.3 mCi F-18 FDG was injected intravenously. Full-ring PET imaging was performed from the skull base to thigh after the radiotracer. CT data was obtained and used for attenuation correction and anatomic localization.  FASTING BLOOD GLUCOSE:  Value: 88 mg/dl  COMPARISON:  None  FINDINGS: NECK  Bilateral palatine tonsil hypermetabolism which is favored to be physiologic. No correlate CT mass. This measures a S.U.V. max of 8.8.  CHEST  Right greater the left hypermetabolic breast masses. The right-sided mass measures a S.U.V. max of 12.9. On the order of 9.8 x 7.6 cm. Image 72.  ABDOMEN/PELVIS  Dominant abdominal pelvic hypermetabolic mass. Superiorly, this measures 8.9 x 15.8 Cm. Inferiorly, 9.6 x 12.4 cm. This measures a S.U.V. max of 14.9.  SKELETON  Multi focal upper metabolic foci, consistent with lymphomatous marrow involvement. Index left humeral head lesion measures a S.U.V. max of  12.3. T8 hypermetabolic focus.  CT IMAGES PERFORMED FOR ATTENUATION CORRECTION  Mucosal thickening right maxillary sinus. No cervical adenopathy. No axillary adenopathy. Small retroperitoneal nodes, without retroperitoneal adenopathy.  No bowel obstruction. Small volume cul-de-sac fluid which could be physiologic or related to the abdominal pelvic mass. Uterus not well visualized.  IMPRESSION: 1. Markedly hypermetabolic active lymphoma within the breasts, abdomen/pelvis, and marrow space. 2. Likely physiologic palatine tonsil hypermetabolism.   Electronically Signed   By: Abigail Miyamoto M.D.   On: 08/25/2014 15:52    Microbiology: No results found for this or any previous visit (from the past 240 hour(s)).   Labs: Basic Metabolic Panel:  Recent Labs Lab 09/07/14 0630 09/08/14 1093 09/09/14 0535 09/10/14 0557 09/11/14 0530  NA 142 138 140 140 143  K 3.6* 3.6* 3.7 3.8 3.6*  CL 106 102 104 101 108  CO2 24 25 26 28 27   GLUCOSE 97 116* 108* 119* 87  BUN 7 8 9 11 10   CREATININE 0.59 0.61 0.66 0.61 0.59  CALCIUM 8.7 8.7 9.2 9.6 8.1*   Liver Function Tests: No results found for this basename: AST, ALT, ALKPHOS, BILITOT, PROT, ALBUMIN,  in the last 168 hours No results found for this basename: LIPASE, AMYLASE,  in the last 168 hours No results found for this basename: AMMONIA,  in the last 168 hours CBC:  Recent Labs Lab 09/09/14 0535  WBC 8.6  HGB 11.6*  HCT 34.7*  MCV 82.8  PLT 315   Cardiac Enzymes: No results found for this basename: CKTOTAL, CKMB, CKMBINDEX, TROPONINI,  in the last 168 hours BNP: BNP (last 3 results) No results found for this basename: PROBNP,  in the last 8760 hours CBG: No results found for this basename: GLUCAP,  in the last 168 hours  Time coordinating discharge: Over 30 minutes

## 2014-09-11 NOTE — Progress Notes (Signed)
Nursing Discharge Summary  Patient ID: Lindsey Irwin MRN: [id number] DOB/AGE: [birth year] 18 y.o.  Admit date: 09/04/2014 Discharge date: 09/11/2014  Discharged Condition: good  Disposition: 01-Home or Self Care  Follow-up Information   Follow up with [facility], NI, MD. (Follow up appt after recent hospitalization; Dr. Alvy Bimler to scheduel follow up appt)    Specialty:  Hematology and Oncology   Contact information:   [place] [address] [phone]       Prescriptions Given: Patient given prescription for Colace. Other medications called into pharmacy for patient. Follow up appointments and medications discussed with patient. PICC line care, Nadir precautions and other questions were answered for the patient and her family at bedside. Everyone verbalized understanding.   Means of Discharge: Patient to be taken downstairs via wheelchair to be discharged home.  Signed: Buel Ream 09/11/2014, 2:38 PM

## 2014-09-11 NOTE — Progress Notes (Signed)
Nutrition Brief Note  RD consulted via pt request for nutrition information regarding immunity support.  RD saw pt 10/22. PO intake and appetite are adequate. Pt is currently receiving chemotherapy. Pt is to be discharged today.  Provided pt with nutrition education in promoting a healthy immune system. Emphasized the importance of consuming adequate protein and a variety of fruits and vegetables, including citrus fruits and dark-green and deep-yellow vegetables. Encouraged physical activity daily.  Current diet order is low-sodium heart healthy, patient is consuming approximately 100% of meals at this time. Labs and medications reviewed.   No further nutrition interventions warranted at this time. If nutrition issues arise, please consult RD.   Clayton Bibles, MS, RD, LDN Pager: [phone] After Hours Pager: [phone]

## 2014-09-12 ENCOUNTER — Ambulatory Visit (HOSPITAL_BASED_OUTPATIENT_CLINIC_OR_DEPARTMENT_OTHER): Payer: Medicaid Other

## 2014-09-12 DIAGNOSIS — Z789 Other specified health status: Secondary | ICD-10-CM

## 2014-09-12 DIAGNOSIS — Z452 Encounter for adjustment and management of vascular access device: Secondary | ICD-10-CM

## 2014-09-12 DIAGNOSIS — Z5189 Encounter for other specified aftercare: Secondary | ICD-10-CM

## 2014-09-12 DIAGNOSIS — IMO0001 Reserved for inherently not codable concepts without codable children: Secondary | ICD-10-CM

## 2014-09-12 DIAGNOSIS — C8338 Diffuse large B-cell lymphoma, lymph nodes of multiple sites: Secondary | ICD-10-CM

## 2014-09-12 DIAGNOSIS — C8330 Diffuse large B-cell lymphoma, unspecified site: Secondary | ICD-10-CM

## 2014-09-12 MED ORDER — HEPARIN SOD (PORK) LOCK FLUSH 100 UNIT/ML IV SOLN
500.0000 [IU] | Freq: Once | INTRAVENOUS | Status: AC
  Administered 2014-09-12: 250 [IU] via INTRAVENOUS
  Filled 2014-09-12: qty 5

## 2014-09-12 MED ORDER — SODIUM CHLORIDE 0.9 % IJ SOLN
10.0000 mL | INTRAMUSCULAR | Status: DC | PRN
  Administered 2014-09-12: 10 mL via INTRAVENOUS
  Filled 2014-09-12: qty 10

## 2014-09-12 MED ORDER — PEGFILGRASTIM INJECTION 6 MG/0.6ML
6.0000 mg | Freq: Once | SUBCUTANEOUS | Status: AC
  Administered 2014-09-12: 6 mg via SUBCUTANEOUS
  Filled 2014-09-12: qty 0.6

## 2014-09-12 MED ORDER — EPOETIN ALFA 40000 UNIT/ML IJ SOLN
40000.0000 [IU] | INTRAMUSCULAR | Status: DC
  Administered 2014-09-12: 40000 [IU] via SUBCUTANEOUS
  Filled 2014-09-12: qty 1

## 2014-09-12 NOTE — Patient Instructions (Signed)
Epoetin Alfa injection What is this medicine? EPOETIN ALFA (e POE e tin AL fa) helps your body make more red blood cells. This medicine is used to treat anemia caused by chronic kidney failure, cancer chemotherapy, or HIV-therapy. It may also be used before surgery if you have anemia. This medicine may be used for other purposes; ask your health care provider or pharmacist if you have questions. COMMON BRAND NAME(S): Epogen, Procrit What should I tell my health care provider before I take this medicine? They need to know if you have any of these conditions: -blood clotting disorders -cancer patient not on chemotherapy -cystic fibrosis -heart disease, such as angina or heart failure -hemoglobin level of 12 g/dL or greater -high blood pressure -low levels of folate, iron, or vitamin B12 -seizures -an unusual or allergic reaction to erythropoietin, albumin, benzyl alcohol, hamster proteins, other medicines, foods, dyes, or preservatives -pregnant or trying to get pregnant -breast-feeding How should I use this medicine? This medicine is for injection into a vein or under the skin. It is usually given by a health care professional in a hospital or clinic setting. If you get this medicine at home, you will be taught how to prepare and give this medicine. Use exactly as directed. Take your medicine at regular intervals. Do not take your medicine more often than directed. It is important that you put your used needles and syringes in a special sharps container. Do not put them in a trash can. If you do not have a sharps container, call your pharmacist or healthcare provider to get one. Talk to your pediatrician regarding the use of this medicine in children. While this drug may be prescribed for selected conditions, precautions do apply. Overdosage: If you think you have taken too much of this medicine contact a poison control center or emergency room at once. NOTE: This medicine is only for you. Do  not share this medicine with others. What if I miss a dose? If you miss a dose, take it as soon as you can. If it is almost time for your next dose, take only that dose. Do not take double or extra doses. What may interact with this medicine? Do not take this medicine with any of the following medications: -darbepoetin alfa This list may not describe all possible interactions. Give your health care provider a list of all the medicines, herbs, non-prescription drugs, or dietary supplements you use. Also tell them if you smoke, drink alcohol, or use illegal drugs. Some items may interact with your medicine. What should I watch for while using this medicine? Visit your prescriber or health care professional for regular checks on your progress and for the needed blood tests and blood pressure measurements. It is especially important for the doctor to make sure your hemoglobin level is in the desired range, to limit the risk of potential side effects and to give you the best benefit. Keep all appointments for any recommended tests. Check your blood pressure as directed. Ask your doctor what your blood pressure should be and when you should contact him or her. As your body makes more red blood cells, you may need to take iron, folic acid, or vitamin B supplements. Ask your doctor or health care provider which products are right for you. If you have kidney disease continue dietary restrictions, even though this medication can make you feel better. Talk with your doctor or health care professional about the foods you eat and the vitamins that you take. What   side effects may I notice from receiving this medicine? Side effects that you should report to your doctor or health care professional as soon as possible: -allergic reactions like skin rash, itching or hives, swelling of the face, lips, or tongue -breathing problems -changes in vision -chest pain -confusion, trouble speaking or understanding -feeling  faint or lightheaded, falls -high blood pressure -muscle aches or pains -pain, swelling, warmth in the leg -rapid weight gain -severe headaches -sudden numbness or weakness of the face, arm or leg -trouble walking, dizziness, loss of balance or coordination -seizures (convulsions) -swelling of the ankles, feet, hands -unusually weak or tired Side effects that usually do not require medical attention (report to your doctor or health care professional if they continue or are bothersome): -diarrhea -fever, chills (flu-like symptoms) -headaches -nausea, vomiting -redness, stinging, or swelling at site where injected This list may not describe all possible side effects. Call your doctor for medical advice about side effects. You may report side effects to FDA at 1-800-FDA-1088. Where should I keep my medicine? Keep out of the reach of children. Store in a refrigerator between 2 and 8 degrees C (36 and 46 degrees F). Do not freeze or shake. Throw away any unused portion if using a single-dose vial. Multi-dose vials can be kept in the refrigerator for up to 21 days after the initial dose. Throw away unused medicine. NOTE: This sheet is a summary. It may not cover all possible information. If you have questions about this medicine, talk to your doctor, pharmacist, or health care provider.  2015, Elsevier/Gold Standard. (2008-10-14 10:25:44) Pegfilgrastim injection What is this medicine? PEGFILGRASTIM (peg fil GRA stim) is a long-acting granulocyte colony-stimulating factor that stimulates the growth of neutrophils, a type of white blood cell important in the body's fight against infection. It is used to reduce the incidence of fever and infection in patients with certain types of cancer who are receiving chemotherapy that affects the bone marrow. This medicine may be used for other purposes; ask your health care provider or pharmacist if you have questions. COMMON BRAND NAME(S): Neulasta What  should I tell my health care provider before I take this medicine? They need to know if you have any of these conditions: -latex allergy -ongoing radiation therapy -sickle cell disease -skin reactions to acrylic adhesives (On-Body Injector only) -an unusual or allergic reaction to pegfilgrastim, filgrastim, other medicines, foods, dyes, or preservatives -pregnant or trying to get pregnant -breast-feeding How should I use this medicine? This medicine is for injection under the skin. If you get this medicine at home, you will be taught how to prepare and give the pre-filled syringe or how to use the On-body Injector. Refer to the patient Instructions for Use for detailed instructions. Use exactly as directed. Take your medicine at regular intervals. Do not take your medicine more often than directed. It is important that you put your used needles and syringes in a special sharps container. Do not put them in a trash can. If you do not have a sharps container, call your pharmacist or healthcare provider to get one. Talk to your pediatrician regarding the use of this medicine in children. Special care may be needed. Overdosage: If you think you have taken too much of this medicine contact a poison control center or emergency room at once. NOTE: This medicine is only for you. Do not share this medicine with others. What if I miss a dose? It is important not to miss your dose. Call your doctor   or health care professional if you miss your dose. If you miss a dose due to an On-body Injector failure or leakage, a new dose should be administered as soon as possible using a single prefilled syringe for manual use. What may interact with this medicine? Interactions have not been studied. Give your health care provider a list of all the medicines, herbs, non-prescription drugs, or dietary supplements you use. Also tell them if you smoke, drink alcohol, or use illegal drugs. Some items may interact with your  medicine. This list may not describe all possible interactions. Give your health care provider a list of all the medicines, herbs, non-prescription drugs, or dietary supplements you use. Also tell them if you smoke, drink alcohol, or use illegal drugs. Some items may interact with your medicine. What should I watch for while using this medicine? You may need blood work done while you are taking this medicine. If you are going to need a MRI, CT scan, or other procedure, tell your doctor that you are using this medicine (On-Body Injector only). What side effects may I notice from receiving this medicine? Side effects that you should report to your doctor or health care professional as soon as possible: -allergic reactions like skin rash, itching or hives, swelling of the face, lips, or tongue -dizziness -fever -pain, redness, or irritation at site where injected -pinpoint red spots on the skin -shortness of breath or breathing problems -stomach or side pain, or pain at the shoulder -swelling -tiredness -trouble passing urine Side effects that usually do not require medical attention (report to your doctor or health care professional if they continue or are bothersome): -bone pain -muscle pain This list may not describe all possible side effects. Call your doctor for medical advice about side effects. You may report side effects to FDA at 1-800-FDA-1088. Where should I keep my medicine? Keep out of the reach of children. Store pre-filled syringes in a refrigerator between 2 and 8 degrees C (36 and 46 degrees F). Do not freeze. Keep in carton to protect from light. Throw away this medicine if it is left out of the refrigerator for more than 48 hours. Throw away any unused medicine after the expiration date. NOTE: This sheet is a summary. It may not cover all possible information. If you have questions about this medicine, talk to your doctor, pharmacist, or health care provider.  2015,  Elsevier/Gold Standard. (2014-01-30 16:14:05)  

## 2014-09-15 ENCOUNTER — Ambulatory Visit (HOSPITAL_BASED_OUTPATIENT_CLINIC_OR_DEPARTMENT_OTHER): Payer: Medicaid Other | Admitting: Hematology and Oncology

## 2014-09-15 ENCOUNTER — Telehealth: Payer: Self-pay | Admitting: Hematology and Oncology

## 2014-09-15 ENCOUNTER — Ambulatory Visit: Payer: Medicaid Other

## 2014-09-15 ENCOUNTER — Other Ambulatory Visit (HOSPITAL_BASED_OUTPATIENT_CLINIC_OR_DEPARTMENT_OTHER): Payer: Medicaid Other

## 2014-09-15 VITALS — BP 133/67 | HR 86 | Temp 98.7°F | Resp 20 | Ht 66.0 in | Wt 198.2 lb

## 2014-09-15 DIAGNOSIS — C8338 Diffuse large B-cell lymphoma, lymph nodes of multiple sites: Secondary | ICD-10-CM

## 2014-09-15 DIAGNOSIS — C8330 Diffuse large B-cell lymphoma, unspecified site: Secondary | ICD-10-CM

## 2014-09-15 DIAGNOSIS — Z452 Encounter for adjustment and management of vascular access device: Secondary | ICD-10-CM

## 2014-09-15 LAB — COMPREHENSIVE METABOLIC PANEL (CC13)
ALT: 28 U/L (ref 0–55)
AST: 12 U/L (ref 5–34)
Albumin: 3.8 g/dL (ref 3.5–5.0)
Alkaline Phosphatase: 109 U/L (ref 40–150)
Anion Gap: 9 mEq/L (ref 3–11)
BUN: 9.4 mg/dL (ref 7.0–26.0)
CALCIUM: 9.5 mg/dL (ref 8.4–10.4)
CHLORIDE: 105 meq/L (ref 98–109)
CO2: 26 mEq/L (ref 22–29)
CREATININE: 0.7 mg/dL (ref 0.6–1.1)
Glucose: 90 mg/dl (ref 70–140)
Potassium: 4.1 mEq/L (ref 3.5–5.1)
Sodium: 140 mEq/L (ref 136–145)
Total Bilirubin: 0.22 mg/dL (ref 0.20–1.20)
Total Protein: 6.8 g/dL (ref 6.4–8.3)

## 2014-09-15 LAB — CBC WITH DIFFERENTIAL/PLATELET
BASO%: 0.4 % (ref 0.0–2.0)
Basophils Absolute: 0.1 10*3/uL (ref 0.0–0.1)
EOS%: 5.2 % (ref 0.0–7.0)
Eosinophils Absolute: 0.6 10*3/uL — ABNORMAL HIGH (ref 0.0–0.5)
HCT: 39 % (ref 34.8–46.6)
HGB: 12.9 g/dL (ref 11.6–15.9)
LYMPH#: 1.9 10*3/uL (ref 0.9–3.3)
LYMPH%: 15.9 % (ref 14.0–49.7)
MCH: 27.3 pg (ref 25.1–34.0)
MCHC: 33.1 g/dL (ref 31.5–36.0)
MCV: 82.6 fL (ref 79.5–101.0)
MONO#: 0.7 10*3/uL (ref 0.1–0.9)
MONO%: 5.8 % (ref 0.0–14.0)
NEUT#: 8.6 10*3/uL — ABNORMAL HIGH (ref 1.5–6.5)
NEUT%: 72.7 % (ref 38.4–76.8)
Platelets: 224 10*3/uL (ref 145–400)
RBC: 4.72 10*6/uL (ref 3.70–5.45)
RDW: 14 % (ref 11.2–14.5)
WBC: 11.9 10*3/uL — ABNORMAL HIGH (ref 3.9–10.3)

## 2014-09-15 LAB — LACTATE DEHYDROGENASE (CC13): LDH: 298 U/L — ABNORMAL HIGH (ref 125–245)

## 2014-09-15 MED ORDER — HEPARIN SOD (PORK) LOCK FLUSH 100 UNIT/ML IV SOLN
500.0000 [IU] | Freq: Once | INTRAVENOUS | Status: AC
  Administered 2014-09-15: 250 [IU] via INTRAVENOUS
  Filled 2014-09-15: qty 5

## 2014-09-15 MED ORDER — SODIUM CHLORIDE 0.9 % IJ SOLN
10.0000 mL | INTRAMUSCULAR | Status: DC | PRN
  Administered 2014-09-15: 10 mL via INTRAVENOUS
  Filled 2014-09-15: qty 10

## 2014-09-15 NOTE — Progress Notes (Signed)
[facility] OFFICE PROGRESS NOTE  Patient Care Team: No Pcp Per Patient as PCP - General (General Practice)  SUMMARY OF ONCOLOGIC HISTORY: Oncology History   Diffuse large B cell lymphoma   Primary site: Lymphoid Neoplasms   Staging method: AJCC 6th Edition   Clinical: Stage IV signed by Heath Lark, MD on 09/03/2014  8:18 PM   Summary: Stage IV IPI of 3: high LDH, >2 extranodal disease and stage IV at presentation       Diffuse large B cell lymphoma   07/30/2014 Imaging She had a bilateral mammogram which came back abnormal. Ultrasound-guided biopsy was performed, suspicious for lymphoma   08/25/2014 Imaging PET/CT scan showed diffuse metastatic cancer involving bilateral breasts, bone, and large abdominal mass   08/28/2014 Surgery She had excisional surgery and removal of right breast mass   08/28/2014 Pathology Results Accession: [id number] right breast mass is consistent with diffuse large B-cell lymphoma with high proliferation index.   09/04/2014 - 09/11/2014 Hospital Admission she is admitted to the hospital for cycle 1 of [facility]   09/04/2014 Imaging echocardiogram is normal.   09/04/2014 Procedure she has placement of PICC line.    INTERVAL HISTORY: Please see below for problem oriented charting. She feels well. Denies side effects of treatment. REVIEW OF SYSTEMS:   Constitutional: Denies fevers, chills or abnormal weight loss Eyes: Denies blurriness of vision Ears, nose, mouth, throat, and face: Denies mucositis or sore throat Respiratory: Denies cough, dyspnea or wheezes Cardiovascular: Denies palpitation, chest discomfort or lower extremity swelling Gastrointestinal:  Denies nausea, heartburn or change in bowel habits Skin: Denies abnormal skin rashes Lymphatics: Denies new lymphadenopathy or easy bruising Neurological:Denies numbness, tingling or new weaknesses Behavioral/Psych: Mood is stable, no new changes  All other systems were reviewed  with the patient and are negative.  I have reviewed the past medical history, past surgical history, social history and family history with the patient and they are unchanged from previous note.  ALLERGIES:  is allergic to other and soap.  MEDICATIONS:  Current Outpatient Prescriptions  Medication Sig Dispense Refill  . acetaminophen (TYLENOL) 500 MG tablet Take 500 mg by mouth every 6 (six) hours as needed for headache.    . allopurinol (ZYLOPRIM) 300 MG tablet Take 1 tablet (300 mg total) by mouth daily. 30 tablet 0  . HYDROcodone-acetaminophen (NORCO/VICODIN) 5-325 MG per tablet Take 1 tablet by mouth every 4 (four) hours as needed. 40 tablet 0  . ondansetron (ZOFRAN) 8 MG tablet Take 1 tablet (8 mg total) by mouth every 8 (eight) hours as needed for nausea. 30 tablet 3  . pantoprazole (PROTONIX) 40 MG tablet Take 1 tablet (40 mg total) by mouth daily at 12 noon. 30 tablet 0  . prochlorperazine (COMPAZINE) 10 MG tablet Take 1 tablet (10 mg total) by mouth every 6 (six) hours as needed for nausea. 30 tablet 3   No current facility-administered medications for this visit.    PHYSICAL EXAMINATION: ECOG PERFORMANCE STATUS: 0 - Asymptomatic  Filed Vitals:   09/15/14 1311  BP: 133/67  Pulse: 86  Temp: 98.7 F (37.1 C)  Resp: 20   Filed Weights   09/15/14 1311  Weight: 198 lb 3.2 oz (89.903 kg)    GENERAL:alert, no distress and comfortable SKIN: skin color, texture, turgor are normal, no rashes or significant lesions EYES: normal, Conjunctiva are pink and non-injected, sclera clear OROPHARYNX:no exudate, no erythema and lips, buccal mucosa, and tongue normal  NECK: supple, thyroid normal size, non-tender,  without nodularity LYMPH:  no palpable lymphadenopathy in the cervical, axillary or inguinal LUNGS: clear to auscultation and percussion with normal breathing effort HEART: regular rate & rhythm and no murmurs and no lower extremity edema ABDOMEN:abdomen soft, non-tender and  normal bowel sounds. Previously palpable enlarged uterus is smaller. Musculoskeletal:no cyanosis of digits and no clubbing  NEURO: alert & oriented x 3 with fluent speech, no focal motor/sensory deficits Previously palpable breast mass is smaller. LABORATORY DATA:  I have reviewed the data as listed    Component Value Date/Time   NA 143 09/11/2014 0530   NA 141 09/03/2014 1135   K 3.6* 09/11/2014 0530   K 4.1 09/03/2014 1135   CL 108 09/11/2014 0530   CO2 27 09/11/2014 0530   CO2 27 09/03/2014 1135   GLUCOSE 87 09/11/2014 0530   GLUCOSE 82 09/03/2014 1135   BUN 10 09/11/2014 0530   BUN 12.8 09/03/2014 1135   CREATININE 0.59 09/11/2014 0530   CREATININE 0.8 09/03/2014 1135   CALCIUM 8.1* 09/11/2014 0530   CALCIUM 10.1 09/03/2014 1135   PROT 7.0 09/03/2014 1135   ALBUMIN 3.9 09/03/2014 1135   AST 13 09/03/2014 1135   ALT 8 09/03/2014 1135   ALKPHOS 65 09/03/2014 1135   BILITOT 0.21 09/03/2014 1135   GFRNONAA >90 09/11/2014 0530   GFRAA >90 09/11/2014 0530    No results found for: SPEP, UPEP  Lab Results  Component Value Date   WBC 11.9* 09/15/2014   NEUTROABS 8.6* 09/15/2014   HGB 12.9 09/15/2014   HCT 39.0 09/15/2014   MCV 82.6 09/15/2014   PLT 224 09/15/2014      Chemistry      Component Value Date/Time   NA 143 09/11/2014 0530   NA 141 09/03/2014 1135   K 3.6* 09/11/2014 0530   K 4.1 09/03/2014 1135   CL 108 09/11/2014 0530   CO2 27 09/11/2014 0530   CO2 27 09/03/2014 1135   BUN 10 09/11/2014 0530   BUN 12.8 09/03/2014 1135   CREATININE 0.59 09/11/2014 0530   CREATININE 0.8 09/03/2014 1135      Component Value Date/Time   CALCIUM 8.1* 09/11/2014 0530   CALCIUM 10.1 09/03/2014 1135   ALKPHOS 65 09/03/2014 1135   AST 13 09/03/2014 1135   ALT 8 09/03/2014 1135   BILITOT 0.21 09/03/2014 1135    ASSESSMENT & PLAN:  Diffuse large B cell lymphoma Clinically, she is responding well to treatment. She has not experienced side effects from treatment so  far. We will continue supportive care. She will continue to come here 3 times a week for PICC line flushes. I will continue Procrit injection once a week to reduce the risk of anemia and the need for transfusion. The patient will be admitted to the hospital for cycle 2 of treatment on November 16.      All questions were answered. The patient knows to call the clinic with any problems, questions or concerns. No barriers to learning was detected. I spent 25 minutes counseling the patient face to face. The total time spent in the appointment was 30 minutes and more than 50% was on counseling and review of test results     [facility], [place], MD 09/15/2014 1:31 PM

## 2014-09-15 NOTE — Telephone Encounter (Signed)
Gave avs & cal for Nov. Sent mess to sch tx. °

## 2014-09-15 NOTE — Assessment & Plan Note (Signed)
Clinically, she is responding well to treatment. She has not experienced side effects from treatment so far. We will continue supportive care. She will continue to come here 3 times a week for PICC line flushes. I will continue Procrit injection once a week to reduce the risk of anemia and the need for transfusion. The patient will be admitted to the hospital for cycle 2 of treatment on November 16.

## 2014-09-15 NOTE — Patient Instructions (Signed)
PICC Home Guide A peripherally inserted central catheter (PICC) is a long, thin, flexible tube that is inserted into a vein in the upper arm. It is a form of intravenous (IV) access. It is considered to be a "central" line because the tip of the PICC ends in a large vein in your chest. This large vein is called the superior vena cava (SVC). The PICC tip ends in the SVC because there is a lot of blood flow in the SVC. This allows medicines and IV fluids to be quickly distributed throughout the body. The PICC is inserted using a sterile technique by a specially trained nurse or physician. After the PICC is inserted, a chest X-ray exam is done to be sure it is in the correct place.  A PICC may be placed for different reasons, such as:  To give medicines and liquid nutrition that can only be given through a central line. Examples are:  Certain antibiotic treatments.  Chemotherapy.  Total parenteral nutrition (TPN).  To take frequent blood samples.  To give IV fluids and blood products.  If there is difficulty placing a peripheral intravenous (PIV) catheter. If taken care of properly, a PICC can remain in place for several months. A PICC can also allow a person to go home from the hospital early. Medicine and PICC care can be managed at home by a family member or home health care team. WHAT PROBLEMS CAN HAPPEN WHEN I HAVE A PICC? Problems with a PICC can occasionally occur. These may include the following:  A blood clot (thrombus) forming in or at the tip of the PICC. This can cause the PICC to become clogged. A clot-dissolving medicine called tissue plasminogen activator (tPA) can be given through the PICC to help break up the clot.  Inflammation of the vein (phlebitis) in which the PICC is placed. Signs of inflammation may include redness, pain at the insertion site, red streaks, or being able to feel a "cord" in the vein where the PICC is located.  Infection in the PICC or at the insertion  site. Signs of infection may include fever, chills, redness, swelling, or pus drainage from the PICC insertion site.  PICC movement (malposition). The PICC tip may move from its original position due to excessive physical activity, forceful coughing, sneezing, or vomiting.  A break or cut in the PICC. It is important to not use scissors near the PICC.  Nerve or tendon irritation or injury during PICC insertion. WHAT SHOULD I KEEP IN MIND ABOUT ACTIVITIES WHEN I HAVE A PICC?  You may bend your arm and move it freely. If your PICC is near or at the bend of your elbow, avoid activity with repeated motion at the elbow.  Rest at home for the remainder of the day following PICC line insertion.  Avoid lifting heavy objects as instructed by your health care provider.  Avoid using a crutch with the arm on the same side as your PICC. You may need to use a walker. WHAT SHOULD I KNOW ABOUT MY PICC DRESSING?  Keep your PICC bandage (dressing) clean and dry to prevent infection.  Ask your health care provider when you may shower. Ask your health care provider to teach you how to wrap the PICC when you do take a shower.  Change the PICC dressing as instructed by your health care provider.  Change your PICC dressing if it becomes loose or wet. WHAT SHOULD I KNOW ABOUT PICC CARE?  Check the PICC insertion site   daily for leakage, redness, swelling, or pain.  Do not take a bath, swim, or use hot tubs when you have a PICC. Cover PICC line with clear plastic wrap and tape to keep it dry while showering.  Flush the PICC as directed by your health care provider. Let your health care provider know right away if the PICC is difficult to flush or does not flush. Do not use force to flush the PICC.  Do not use a syringe that is less than 10 mL to flush the PICC.  Never pull or tug on the PICC.  Avoid blood pressure checks on the arm with the PICC.  Keep your PICC identification card with you at all  times.  Do not take the PICC out yourself. Only a trained clinical professional should remove the PICC. SEEK IMMEDIATE MEDICAL CARE IF:  Your PICC is accidentally pulled all the way out. If this happens, cover the insertion site with a bandage or gauze dressing. Do not throw the PICC away. Your health care provider will need to inspect it.  Your PICC was tugged or pulled and has partially come out. Do not  push the PICC back in.  There is any type of drainage, redness, or swelling where the PICC enters the skin.  You cannot flush the PICC, it is difficult to flush, or the PICC leaks around the insertion site when it is flushed.  You hear a "flushing" sound when the PICC is flushed.  You have pain, discomfort, or numbness in your arm, shoulder, or jaw on the same side as the PICC.  You feel your heart "racing" or skipping beats.  You notice a hole or tear in the PICC.  You develop chills or a fever. MAKE SURE YOU:   Understand these instructions.  Will watch your condition.  Will get help right away if you are not doing well or get worse. Document Released: 05/07/2003 Document Revised: 03/17/2014 Document Reviewed: 07/08/2013 Exit[employer] Patient Information 2015 [employer]. This information is not intended to replace advice given to you by your health care provider. Make sure you discuss any questions you have with your health care provider.  

## 2014-09-17 ENCOUNTER — Ambulatory Visit (HOSPITAL_BASED_OUTPATIENT_CLINIC_OR_DEPARTMENT_OTHER): Payer: Medicaid Other

## 2014-09-17 VITALS — BP 141/69 | HR 85 | Temp 98.2°F

## 2014-09-17 DIAGNOSIS — Z452 Encounter for adjustment and management of vascular access device: Secondary | ICD-10-CM | POA: Diagnosis present

## 2014-09-17 DIAGNOSIS — C8338 Diffuse large B-cell lymphoma, lymph nodes of multiple sites: Secondary | ICD-10-CM | POA: Diagnosis not present

## 2014-09-17 MED ORDER — SODIUM CHLORIDE 0.9 % IJ SOLN
10.0000 mL | INTRAMUSCULAR | Status: DC | PRN
  Administered 2014-09-17: 10 mL via INTRAVENOUS
  Filled 2014-09-17: qty 10

## 2014-09-17 MED ORDER — HEPARIN SOD (PORK) LOCK FLUSH 100 UNIT/ML IV SOLN
500.0000 [IU] | Freq: Once | INTRAVENOUS | Status: AC
  Administered 2014-09-17: 500 [IU] via INTRAVENOUS
  Filled 2014-09-17: qty 5

## 2014-09-17 NOTE — Patient Instructions (Signed)
PICC Home Guide A peripherally inserted central catheter (PICC) is a long, thin, flexible tube that is inserted into a vein in the upper arm. It is a form of intravenous (IV) access. It is considered to be a "central" line because the tip of the PICC ends in a large vein in your chest. This large vein is called the superior vena cava (SVC). The PICC tip ends in the SVC because there is a lot of blood flow in the SVC. This allows medicines and IV fluids to be quickly distributed throughout the body. The PICC is inserted using a sterile technique by a specially trained nurse or physician. After the PICC is inserted, a chest X-ray exam is done to be sure it is in the correct place.  A PICC may be placed for different reasons, such as:  To give medicines and liquid nutrition that can only be given through a central line. Examples are:  Certain antibiotic treatments.  Chemotherapy.  Total parenteral nutrition (TPN).  To take frequent blood samples.  To give IV fluids and blood products.  If there is difficulty placing a peripheral intravenous (PIV) catheter. If taken care of properly, a PICC can remain in place for several months. A PICC can also allow a person to go home from the hospital early. Medicine and PICC care can be managed at home by a family member or home health care team. WHAT PROBLEMS CAN HAPPEN WHEN I HAVE A PICC? Problems with a PICC can occasionally occur. These may include the following:  A blood clot (thrombus) forming in or at the tip of the PICC. This can cause the PICC to become clogged. A clot-dissolving medicine called tissue plasminogen activator (tPA) can be given through the PICC to help break up the clot.  Inflammation of the vein (phlebitis) in which the PICC is placed. Signs of inflammation may include redness, pain at the insertion site, red streaks, or being able to feel a "cord" in the vein where the PICC is located.  Infection in the PICC or at the insertion  site. Signs of infection may include fever, chills, redness, swelling, or pus drainage from the PICC insertion site.  PICC movement (malposition). The PICC tip may move from its original position due to excessive physical activity, forceful coughing, sneezing, or vomiting.  A break or cut in the PICC. It is important to not use scissors near the PICC.  Nerve or tendon irritation or injury during PICC insertion. WHAT SHOULD I KEEP IN MIND ABOUT ACTIVITIES WHEN I HAVE A PICC?  You may bend your arm and move it freely. If your PICC is near or at the bend of your elbow, avoid activity with repeated motion at the elbow.  Rest at home for the remainder of the day following PICC line insertion.  Avoid lifting heavy objects as instructed by your health care provider.  Avoid using a crutch with the arm on the same side as your PICC. You may need to use a walker. WHAT SHOULD I KNOW ABOUT MY PICC DRESSING?  Keep your PICC bandage (dressing) clean and dry to prevent infection.  Ask your health care provider when you may shower. Ask your health care provider to teach you how to wrap the PICC when you do take a shower.  Change the PICC dressing as instructed by your health care provider.  Change your PICC dressing if it becomes loose or wet. WHAT SHOULD I KNOW ABOUT PICC CARE?  Check the PICC insertion site   daily for leakage, redness, swelling, or pain.  Do not take a bath, swim, or use hot tubs when you have a PICC. Cover PICC line with clear plastic wrap and tape to keep it dry while showering.  Flush the PICC as directed by your health care provider. Let your health care provider know right away if the PICC is difficult to flush or does not flush. Do not use force to flush the PICC.  Do not use a syringe that is less than 10 mL to flush the PICC.  Never pull or tug on the PICC.  Avoid blood pressure checks on the arm with the PICC.  Keep your PICC identification card with you at all  times.  Do not take the PICC out yourself. Only a trained clinical professional should remove the PICC. SEEK IMMEDIATE MEDICAL CARE IF:  Your PICC is accidentally pulled all the way out. If this happens, cover the insertion site with a bandage or gauze dressing. Do not throw the PICC away. Your health care provider will need to inspect it.  Your PICC was tugged or pulled and has partially come out. Do not  push the PICC back in.  There is any type of drainage, redness, or swelling where the PICC enters the skin.  You cannot flush the PICC, it is difficult to flush, or the PICC leaks around the insertion site when it is flushed.  You hear a "flushing" sound when the PICC is flushed.  You have pain, discomfort, or numbness in your arm, shoulder, or jaw on the same side as the PICC.  You feel your heart "racing" or skipping beats.  You notice a hole or tear in the PICC.  You develop chills or a fever. MAKE SURE YOU:   Understand these instructions.  Will watch your condition.  Will get help right away if you are not doing well or get worse. Document Released: 05/07/2003 Document Revised: 03/17/2014 Document Reviewed: 07/08/2013 Exit[employer] Patient Information 2015 [employer]. This information is not intended to replace advice given to you by your health care provider. Make sure you discuss any questions you have with your health care provider.  

## 2014-09-19 ENCOUNTER — Ambulatory Visit: Payer: Medicaid Other

## 2014-09-19 ENCOUNTER — Ambulatory Visit (HOSPITAL_BASED_OUTPATIENT_CLINIC_OR_DEPARTMENT_OTHER): Payer: Medicaid Other

## 2014-09-19 DIAGNOSIS — C8338 Diffuse large B-cell lymphoma, lymph nodes of multiple sites: Secondary | ICD-10-CM

## 2014-09-19 DIAGNOSIS — D649 Anemia, unspecified: Secondary | ICD-10-CM

## 2014-09-19 DIAGNOSIS — Z452 Encounter for adjustment and management of vascular access device: Secondary | ICD-10-CM

## 2014-09-19 DIAGNOSIS — Z789 Other specified health status: Secondary | ICD-10-CM

## 2014-09-19 DIAGNOSIS — C8330 Diffuse large B-cell lymphoma, unspecified site: Secondary | ICD-10-CM

## 2014-09-19 DIAGNOSIS — IMO0001 Reserved for inherently not codable concepts without codable children: Secondary | ICD-10-CM

## 2014-09-19 MED ORDER — EPOETIN ALFA 40000 UNIT/ML IJ SOLN
40000.0000 [IU] | INTRAMUSCULAR | Status: DC
  Administered 2014-09-19: 40000 [IU] via SUBCUTANEOUS
  Filled 2014-09-19: qty 1

## 2014-09-19 MED ORDER — HEPARIN SOD (PORK) LOCK FLUSH 100 UNIT/ML IV SOLN
250.0000 [IU] | Freq: Once | INTRAVENOUS | Status: AC
  Administered 2014-09-19: 250 [IU] via INTRAVENOUS
  Filled 2014-09-19: qty 5

## 2014-09-19 MED ORDER — SODIUM CHLORIDE 0.9 % IJ SOLN
10.0000 mL | Freq: Once | INTRAMUSCULAR | Status: AC
  Administered 2014-09-19: 10 mL
  Filled 2014-09-19: qty 10

## 2014-09-19 NOTE — Progress Notes (Signed)
Patient had two encounters see other encounter.

## 2014-09-19 NOTE — Progress Notes (Signed)
Patient receives procrit once a week regardless of her hgb per Dr. Alvy Bimler.

## 2014-09-22 ENCOUNTER — Ambulatory Visit (HOSPITAL_BASED_OUTPATIENT_CLINIC_OR_DEPARTMENT_OTHER): Payer: Medicaid Other

## 2014-09-22 ENCOUNTER — Telehealth: Payer: Self-pay | Admitting: *Deleted

## 2014-09-22 DIAGNOSIS — C8338 Diffuse large B-cell lymphoma, lymph nodes of multiple sites: Secondary | ICD-10-CM

## 2014-09-22 DIAGNOSIS — Z452 Encounter for adjustment and management of vascular access device: Secondary | ICD-10-CM

## 2014-09-22 MED ORDER — HEPARIN SOD (PORK) LOCK FLUSH 100 UNIT/ML IV SOLN
500.0000 [IU] | Freq: Once | INTRAVENOUS | Status: AC
  Administered 2014-09-22: 250 [IU] via INTRAVENOUS
  Filled 2014-09-22: qty 5

## 2014-09-22 MED ORDER — SODIUM CHLORIDE 0.9 % IJ SOLN
10.0000 mL | INTRAMUSCULAR | Status: DC | PRN
  Administered 2014-09-22: 10 mL via INTRAVENOUS
  Filled 2014-09-22: qty 10

## 2014-09-22 NOTE — Telephone Encounter (Addendum)
PT. NOTICED THIS AREA TWO DAYS AGO. IT IS RED, SWOLLEN, AND PAINFUL ON A SCALE OF FOUR WHEN PT. IS WALKING. PT.'S MOTHER STATES IT LOOKS LIKE A "BOIL". PT. WILL BE SEEN TODAY.

## 2014-09-22 NOTE — Patient Instructions (Signed)
PICC Home Guide A peripherally inserted central catheter (PICC) is a long, thin, flexible tube that is inserted into a vein in the upper arm. It is a form of intravenous (IV) access. It is considered to be a "central" line because the tip of the PICC ends in a large vein in your chest. This large vein is called the superior vena cava (SVC). The PICC tip ends in the SVC because there is a lot of blood flow in the SVC. This allows medicines and IV fluids to be quickly distributed throughout the body. The PICC is inserted using a sterile technique by a specially trained nurse or physician. After the PICC is inserted, a chest X-ray exam is done to be sure it is in the correct place.  A PICC may be placed for different reasons, such as:  To give medicines and liquid nutrition that can only be given through a central line. Examples are:  Certain antibiotic treatments.  Chemotherapy.  Total parenteral nutrition (TPN).  To take frequent blood samples.  To give IV fluids and blood products.  If there is difficulty placing a peripheral intravenous (PIV) catheter. If taken care of properly, a PICC can remain in place for several months. A PICC can also allow a person to go home from the hospital early. Medicine and PICC care can be managed at home by a family member or home health care team. WHAT PROBLEMS CAN HAPPEN WHEN I HAVE A PICC? Problems with a PICC can occasionally occur. These may include the following:  A blood clot (thrombus) forming in or at the tip of the PICC. This can cause the PICC to become clogged. A clot-dissolving medicine called tissue plasminogen activator (tPA) can be given through the PICC to help break up the clot.  Inflammation of the vein (phlebitis) in which the PICC is placed. Signs of inflammation may include redness, pain at the insertion site, red streaks, or being able to feel a "cord" in the vein where the PICC is located.  Infection in the PICC or at the insertion  site. Signs of infection may include fever, chills, redness, swelling, or pus drainage from the PICC insertion site.  PICC movement (malposition). The PICC tip may move from its original position due to excessive physical activity, forceful coughing, sneezing, or vomiting.  A break or cut in the PICC. It is important to not use scissors near the PICC.  Nerve or tendon irritation or injury during PICC insertion. WHAT SHOULD I KEEP IN MIND ABOUT ACTIVITIES WHEN I HAVE A PICC?  You may bend your arm and move it freely. If your PICC is near or at the bend of your elbow, avoid activity with repeated motion at the elbow.  Rest at home for the remainder of the day following PICC line insertion.  Avoid lifting heavy objects as instructed by your health care provider.  Avoid using a crutch with the arm on the same side as your PICC. You may need to use a walker. WHAT SHOULD I KNOW ABOUT MY PICC DRESSING?  Keep your PICC bandage (dressing) clean and dry to prevent infection.  Ask your health care provider when you may shower. Ask your health care provider to teach you how to wrap the PICC when you do take a shower.  Change the PICC dressing as instructed by your health care provider.  Change your PICC dressing if it becomes loose or wet. WHAT SHOULD I KNOW ABOUT PICC CARE?  Check the PICC insertion site   daily for leakage, redness, swelling, or pain.  Do not take a bath, swim, or use hot tubs when you have a PICC. Cover PICC line with clear plastic wrap and tape to keep it dry while showering.  Flush the PICC as directed by your health care provider. Let your health care provider know right away if the PICC is difficult to flush or does not flush. Do not use force to flush the PICC.  Do not use a syringe that is less than 10 mL to flush the PICC.  Never pull or tug on the PICC.  Avoid blood pressure checks on the arm with the PICC.  Keep your PICC identification card with you at all  times.  Do not take the PICC out yourself. Only a trained clinical professional should remove the PICC. SEEK IMMEDIATE MEDICAL CARE IF:  Your PICC is accidentally pulled all the way out. If this happens, cover the insertion site with a bandage or gauze dressing. Do not throw the PICC away. Your health care provider will need to inspect it.  Your PICC was tugged or pulled and has partially come out. Do not  push the PICC back in.  There is any type of drainage, redness, or swelling where the PICC enters the skin.  You cannot flush the PICC, it is difficult to flush, or the PICC leaks around the insertion site when it is flushed.  You hear a "flushing" sound when the PICC is flushed.  You have pain, discomfort, or numbness in your arm, shoulder, or jaw on the same side as the PICC.  You feel your heart "racing" or skipping beats.  You notice a hole or tear in the PICC.  You develop chills or a fever. MAKE SURE YOU:   Understand these instructions.  Will watch your condition.  Will get help right away if you are not doing well or get worse. Document Released: 05/07/2003 Document Revised: 03/17/2014 Document Reviewed: 07/08/2013 Exit[employer] Patient Information 2015 [employer]. This information is not intended to replace advice given to you by your health care provider. Make sure you discuss any questions you have with your health care provider.  

## 2014-09-24 ENCOUNTER — Other Ambulatory Visit: Payer: Self-pay | Admitting: *Deleted

## 2014-09-24 ENCOUNTER — Other Ambulatory Visit (HOSPITAL_BASED_OUTPATIENT_CLINIC_OR_DEPARTMENT_OTHER): Payer: Medicaid Other

## 2014-09-24 ENCOUNTER — Telehealth: Payer: Self-pay | Admitting: Hematology and Oncology

## 2014-09-24 ENCOUNTER — Other Ambulatory Visit: Payer: Self-pay | Admitting: Hematology and Oncology

## 2014-09-24 ENCOUNTER — Encounter (HOSPITAL_COMMUNITY): Payer: Self-pay

## 2014-09-24 ENCOUNTER — Encounter: Payer: Self-pay | Admitting: *Deleted

## 2014-09-24 ENCOUNTER — Encounter: Payer: Self-pay | Admitting: Hematology and Oncology

## 2014-09-24 ENCOUNTER — Ambulatory Visit (HOSPITAL_BASED_OUTPATIENT_CLINIC_OR_DEPARTMENT_OTHER): Payer: Medicaid Other | Admitting: Hematology and Oncology

## 2014-09-24 ENCOUNTER — Ambulatory Visit (HOSPITAL_BASED_OUTPATIENT_CLINIC_OR_DEPARTMENT_OTHER): Payer: Medicaid Other

## 2014-09-24 VITALS — BP 117/60 | HR 82 | Temp 98.4°F | Resp 18 | Ht 66.0 in | Wt 205.4 lb

## 2014-09-24 DIAGNOSIS — C8330 Diffuse large B-cell lymphoma, unspecified site: Secondary | ICD-10-CM

## 2014-09-24 DIAGNOSIS — L739 Follicular disorder, unspecified: Secondary | ICD-10-CM

## 2014-09-24 DIAGNOSIS — Z452 Encounter for adjustment and management of vascular access device: Secondary | ICD-10-CM

## 2014-09-24 DIAGNOSIS — G4700 Insomnia, unspecified: Secondary | ICD-10-CM

## 2014-09-24 DIAGNOSIS — IMO0001 Reserved for inherently not codable concepts without codable children: Secondary | ICD-10-CM

## 2014-09-24 DIAGNOSIS — Z789 Other specified health status: Secondary | ICD-10-CM

## 2014-09-24 HISTORY — DX: Insomnia, unspecified: G47.00

## 2014-09-24 LAB — CBC WITH DIFFERENTIAL/PLATELET
BASO%: 1 % (ref 0.0–2.0)
Basophils Absolute: 0.2 10*3/uL — ABNORMAL HIGH (ref 0.0–0.1)
EOS%: 0.4 % (ref 0.0–7.0)
Eosinophils Absolute: 0.1 10*3/uL (ref 0.0–0.5)
HEMATOCRIT: 37.9 % (ref 34.8–46.6)
HGB: 12.1 g/dL (ref 11.6–15.9)
LYMPH%: 12.8 % — AB (ref 14.0–49.7)
MCH: 26.9 pg (ref 25.1–34.0)
MCHC: 31.8 g/dL (ref 31.5–36.0)
MCV: 84.6 fL (ref 79.5–101.0)
MONO#: 0.7 10*3/uL (ref 0.1–0.9)
MONO%: 4.6 % (ref 0.0–14.0)
NEUT%: 81.2 % — AB (ref 38.4–76.8)
NEUTROS ABS: 12 10*3/uL — AB (ref 1.5–6.5)
PLATELETS: 264 10*3/uL (ref 145–400)
RBC: 4.48 10*6/uL (ref 3.70–5.45)
RDW: 16.3 % — ABNORMAL HIGH (ref 11.2–14.5)
WBC: 14.8 10*3/uL — ABNORMAL HIGH (ref 3.9–10.3)
lymph#: 1.9 10*3/uL (ref 0.9–3.3)

## 2014-09-24 LAB — COMPREHENSIVE METABOLIC PANEL (CC13)
ALK PHOS: 108 U/L (ref 40–150)
ALT: 9 U/L (ref 0–55)
ANION GAP: 8 meq/L (ref 3–11)
AST: 8 U/L (ref 5–34)
Albumin: 3.5 g/dL (ref 3.5–5.0)
BUN: 8.6 mg/dL (ref 7.0–26.0)
CO2: 27 meq/L (ref 22–29)
CREATININE: 0.8 mg/dL (ref 0.6–1.1)
Calcium: 9.3 mg/dL (ref 8.4–10.4)
Chloride: 109 mEq/L (ref 98–109)
GLUCOSE: 109 mg/dL (ref 70–140)
Potassium: 3.7 mEq/L (ref 3.5–5.1)
Sodium: 143 mEq/L (ref 136–145)
Total Bilirubin: <0.2 mg/dL (ref 0.20–1.20)
Total Protein: 6.5 g/dL (ref 6.4–8.3)

## 2014-09-24 LAB — LACTATE DEHYDROGENASE (CC13): LDH: 294 U/L — ABNORMAL HIGH (ref 125–245)

## 2014-09-24 MED ORDER — HEPARIN SOD (PORK) LOCK FLUSH 100 UNIT/ML IV SOLN
500.0000 [IU] | Freq: Once | INTRAVENOUS | Status: AC
  Administered 2014-09-24: 500 [IU] via INTRAVENOUS
  Filled 2014-09-24: qty 5

## 2014-09-24 MED ORDER — ZOLPIDEM TARTRATE 10 MG PO TABS: 10.0000 mg | ORAL_TABLET | Freq: Every evening | ORAL | Status: DC | PRN

## 2014-09-24 MED ORDER — SODIUM CHLORIDE 0.9 % IJ SOLN
10.0000 mL | INTRAMUSCULAR | Status: DC | PRN
  Administered 2014-09-24: 10 mL via INTRAVENOUS
  Filled 2014-09-24: qty 10

## 2014-09-24 NOTE — Telephone Encounter (Signed)
Confirm 09/26/14 d/t.

## 2014-09-24 NOTE — Assessment & Plan Note (Signed)
This is mild. Antibiotics is not needed. I recommend increase hygiene and close observation.

## 2014-09-24 NOTE — Progress Notes (Signed)
[place] OFFICE PROGRESS NOTE  Patient Care Team: No Pcp Per Patient as PCP - General (General Practice)  SUMMARY OF ONCOLOGIC HISTORY: Oncology History   Diffuse large B cell lymphoma   Primary site: Lymphoid Neoplasms   Staging method: AJCC 6th Edition   Clinical: Stage IV signed by Heath Lark, MD on 09/03/2014  8:18 PM   Summary: Stage IV IPI of 3: high LDH, >2 extranodal disease and stage IV at presentation       Diffuse large B cell lymphoma   07/30/2014 Imaging She had a bilateral mammogram which came back abnormal. Ultrasound-guided biopsy was performed, suspicious for lymphoma   08/25/2014 Imaging PET/CT scan showed diffuse metastatic cancer involving bilateral breasts, bone, and large abdominal mass   08/28/2014 Surgery She had excisional surgery and removal of right breast mass   08/28/2014 Pathology Results Accession: [id number] right breast mass is consistent with diffuse large B-cell lymphoma with high proliferation index.   09/04/2014 - 09/11/2014 Hospital Admission she is admitted to the hospital for cycle 1 of [place]   09/04/2014 Imaging echocardiogram is normal.   09/04/2014 Procedure she has placement of PICC line.    INTERVAL HISTORY: Please see below for problem oriented charting. She is seen prior to cycle 2 of treatment. She is doing well apart from insomnia.  REVIEW OF SYSTEMS:   Constitutional: Denies fevers, chills or abnormal weight loss Eyes: Denies blurriness of vision Ears, nose, mouth, throat, and face: Denies mucositis or sore throat Respiratory: Denies cough, dyspnea or wheezes Cardiovascular: Denies palpitation, chest discomfort or lower extremity swelling Gastrointestinal:  Denies nausea, heartburn or change in bowel habits Skin: Denies abnormal skin rashes Lymphatics: Denies new lymphadenopathy or easy bruising Neurological:Denies numbness, tingling or new weaknesses Behavioral/Psych: Mood is stable, no new changes   All other systems were reviewed with the patient and are negative.  I have reviewed the past medical history, past surgical history, social history and family history with the patient and they are unchanged from previous note.  ALLERGIES:  is allergic to other and soap.  MEDICATIONS:  Current Outpatient Prescriptions  Medication Sig Dispense Refill  . acetaminophen (TYLENOL) 500 MG tablet Take 500 mg by mouth every 6 (six) hours as needed for headache.    . allopurinol (ZYLOPRIM) 300 MG tablet Take 1 tablet (300 mg total) by mouth daily. 30 tablet 0  . HYDROcodone-acetaminophen (NORCO/VICODIN) 5-325 MG per tablet Take 1 tablet by mouth every 4 (four) hours as needed. 40 tablet 0  . ondansetron (ZOFRAN) 8 MG tablet Take 1 tablet (8 mg total) by mouth every 8 (eight) hours as needed for nausea. 30 tablet 3  . pantoprazole (PROTONIX) 40 MG tablet Take 1 tablet (40 mg total) by mouth daily at 12 noon. 30 tablet 0  . prochlorperazine (COMPAZINE) 10 MG tablet Take 1 tablet (10 mg total) by mouth every 6 (six) hours as needed for nausea. 30 tablet 3  . zolpidem (AMBIEN) 10 MG tablet Take 1 tablet (10 mg total) by mouth at bedtime as needed for sleep. 30 tablet 0   No current facility-administered medications for this visit.    PHYSICAL EXAMINATION: ECOG PERFORMANCE STATUS: 0 - Asymptomatic  Filed Vitals:   09/24/14 0942  BP: 117/60  Pulse: 82  Temp: 98.4 F (36.9 C)  Resp: 18   Filed Weights   09/24/14 0942  Weight: 205 lb 6.4 oz (93.169 kg)    GENERAL:alert, no distress and comfortable SKIN: skin color, texture, turgor are  normal, no rashes or significant lesions. Noticed slight skin infection under her armpit and near her groin area. EYES: normal, Conjunctiva are pink and non-injected, sclera clear OROPHARYNX:no exudate, no erythema and lips, buccal mucosa, and tongue normal  NECK: supple, thyroid normal size, non-tender, without nodularity LYMPH:  no palpable lymphadenopathy in  the cervical, axillary or inguinal LUNGS: clear to auscultation and percussion with normal breathing effort HEART: regular rate & rhythm and no murmurs and no lower extremity edema ABDOMEN:abdomen soft, non-tender and normal bowel sounds Musculoskeletal:no cyanosis of digits and no clubbing  NEURO: alert & oriented x 3 with fluent speech, no focal motor/sensory deficits  LABORATORY DATA:  I have reviewed the data as listed    Component Value Date/Time   NA 143 09/24/2014 0926   NA 143 09/11/2014 0530   K 3.7 09/24/2014 0926   K 3.6* 09/11/2014 0530   CL 108 09/11/2014 0530   CO2 27 09/24/2014 0926   CO2 27 09/11/2014 0530   GLUCOSE 109 09/24/2014 0926   GLUCOSE 87 09/11/2014 0530   BUN 8.6 09/24/2014 0926   BUN 10 09/11/2014 0530   CREATININE 0.8 09/24/2014 0926   CREATININE 0.59 09/11/2014 0530   CALCIUM 9.3 09/24/2014 0926   CALCIUM 8.1* 09/11/2014 0530   PROT 6.5 09/24/2014 0926   ALBUMIN 3.5 09/24/2014 0926   AST 8 09/24/2014 0926   ALT 9 09/24/2014 0926   ALKPHOS 108 09/24/2014 0926   BILITOT <0.20 09/24/2014 0926   GFRNONAA >90 09/11/2014 0530   GFRAA >90 09/11/2014 0530    No results found for: SPEP, UPEP  Lab Results  Component Value Date   WBC 14.8* 09/24/2014   NEUTROABS 12.0* 09/24/2014   HGB 12.1 09/24/2014   HCT 37.9 09/24/2014   MCV 84.6 09/24/2014   PLT 264 09/24/2014      Chemistry      Component Value Date/Time   NA 143 09/24/2014 0926   NA 143 09/11/2014 0530   K 3.7 09/24/2014 0926   K 3.6* 09/11/2014 0530   CL 108 09/11/2014 0530   CO2 27 09/24/2014 0926   CO2 27 09/11/2014 0530   BUN 8.6 09/24/2014 0926   BUN 10 09/11/2014 0530   CREATININE 0.8 09/24/2014 0926   CREATININE 0.59 09/11/2014 0530      Component Value Date/Time   CALCIUM 9.3 09/24/2014 0926   CALCIUM 8.1* 09/11/2014 0530   ALKPHOS 108 09/24/2014 0926   AST 8 09/24/2014 0926   ALT 9 09/24/2014 0926   BILITOT <0.20 09/24/2014 0926      ASSESSMENT & PLAN:   Diffuse large B cell lymphoma She tolerated cycle 1 very well. We will proceed with cycle 2, conventional way without dose adjustment. She has extensive stage IV disease with high-intermediate prognostic score (IPI=3).  She will proceed with treatment along with intrathecal methotrexate. She will need Lupron injection to prevent menstruation and preserve fertility. She will be admitted to the hospital on 09/29/2014. I will proceed with intrathecal methotrexate on 10/01/2014. After discharge, she will return to the [facility] on a weekly basis for supportive care. On 10/06/2014, she will receive Procrit injection along with Neulasta. I will recheck urine pregnancy test at the end of this week on 09/26/2014 in anticipation for chemotherapy on 09/29/2014.   Insomnia I recommend a trial of over-the-counter sleeping aid. If she does not respond to it, I will also give her prescription of Ambien to take as needed.  Patient is Jehovah's Witness Due to her  religious belief, she will not received blood products. She will receive Procrit injection weekly. So far, she has no signs of anemia.  Folliculitis This is mild. Antibiotics is not needed. I recommend increase hygiene and close observation.   I plan to stage her after cycle 2 to assess response to treatment. Clinically, she is doing very well.  Orders Placed This Encounter  Procedures  . NM PET Image Restag (PS) Skull Base To Thigh    Standing Status: Future     Number of Occurrences:      Standing Expiration Date: 11/24/2015    Order Specific Question:  Reason for Exam (SYMPTOM  OR DIAGNOSIS REQUIRED)    Answer:  staging lymphoma    Order Specific Question:  Is the patient pregnant?    Answer:  No    Order Specific Question:  Preferred imaging location?    Answer:  [facility]  . Lactate dehydrogenase    Standing Status: Future     Number of Occurrences:      Standing Expiration Date: 10/29/2015  . Pregnancy, urine     Standing Status: Standing     Number of Occurrences: 3     Standing Expiration Date: 09/25/2015   All questions were answered. The patient knows to call the clinic with any problems, questions or concerns. No barriers to learning was detected. I spent 55 minutes counseling the patient face to face. The total time spent in the appointment was 60 minutes and more than 50% was on counseling and review of test results     [facility], Nambe, MD 09/24/2014 1:48 PM

## 2014-09-24 NOTE — Assessment & Plan Note (Addendum)
She tolerated cycle 1 very well. We will proceed with cycle 2, conventional way without dose adjustment. She has extensive stage IV disease with high-intermediate prognostic score (IPI=3).  She will proceed with treatment along with intrathecal methotrexate. She will need Lupron injection to prevent menstruation and preserve fertility. She will be admitted to the hospital on 09/29/2014. I will proceed with intrathecal methotrexate on 10/01/2014. After discharge, she will return to the [facility] on a weekly basis for supportive care. On 10/06/2014, she will receive Procrit injection along with Neulasta. I will recheck urine pregnancy test at the end of this week on 09/26/2014 in anticipation for chemotherapy on 09/29/2014.

## 2014-09-24 NOTE — Progress Notes (Signed)
Planned admit to [facility] [place] for Chemotherapy on 09/29/14 per Dr. Alvy Bimler.  Notified Dannielle Huh in managed care dept..  S/w Robin in Pt Placement and requested bed assignment.  S/w Caren Griffins on [facility] and notified of planned admission.

## 2014-09-24 NOTE — Telephone Encounter (Signed)
Gave avs & cal for Nov/Dec. °

## 2014-09-24 NOTE — Assessment & Plan Note (Signed)
Due to her religious belief, she will not received blood products. She will receive Procrit injection weekly. So far, she has no signs of anemia.   

## 2014-09-24 NOTE — Patient Instructions (Signed)
PICC Home Guide A peripherally inserted central catheter (PICC) is a long, thin, flexible tube that is inserted into a vein in the upper arm. It is a form of intravenous (IV) access. It is considered to be a "central" line because the tip of the PICC ends in a large vein in your chest. This large vein is called the superior vena cava (SVC). The PICC tip ends in the SVC because there is a lot of blood flow in the SVC. This allows medicines and IV fluids to be quickly distributed throughout the body. The PICC is inserted using a sterile technique by a specially trained nurse or physician. After the PICC is inserted, a chest X-ray exam is done to be sure it is in the correct place.  A PICC may be placed for different reasons, such as:  To give medicines and liquid nutrition that can only be given through a central line. Examples are:  Certain antibiotic treatments.  Chemotherapy.  Total parenteral nutrition (TPN).  To take frequent blood samples.  To give IV fluids and blood products.  If there is difficulty placing a peripheral intravenous (PIV) catheter. If taken care of properly, a PICC can remain in place for several months. A PICC can also allow a person to go home from the hospital early. Medicine and PICC care can be managed at home by a family member or home health care team. WHAT PROBLEMS CAN HAPPEN WHEN I HAVE A PICC? Problems with a PICC can occasionally occur. These may include the following:  A blood clot (thrombus) forming in or at the tip of the PICC. This can cause the PICC to become clogged. A clot-dissolving medicine called tissue plasminogen activator (tPA) can be given through the PICC to help break up the clot.  Inflammation of the vein (phlebitis) in which the PICC is placed. Signs of inflammation may include redness, pain at the insertion site, red streaks, or being able to feel a "cord" in the vein where the PICC is located.  Infection in the PICC or at the insertion  site. Signs of infection may include fever, chills, redness, swelling, or pus drainage from the PICC insertion site.  PICC movement (malposition). The PICC tip may move from its original position due to excessive physical activity, forceful coughing, sneezing, or vomiting.  A break or cut in the PICC. It is important to not use scissors near the PICC.  Nerve or tendon irritation or injury during PICC insertion. WHAT SHOULD I KEEP IN MIND ABOUT ACTIVITIES WHEN I HAVE A PICC?  You may bend your arm and move it freely. If your PICC is near or at the bend of your elbow, avoid activity with repeated motion at the elbow.  Rest at home for the remainder of the day following PICC line insertion.  Avoid lifting heavy objects as instructed by your health care provider.  Avoid using a crutch with the arm on the same side as your PICC. You may need to use a walker. WHAT SHOULD I KNOW ABOUT MY PICC DRESSING?  Keep your PICC bandage (dressing) clean and dry to prevent infection.  Ask your health care provider when you may shower. Ask your health care provider to teach you how to wrap the PICC when you do take a shower.  Change the PICC dressing as instructed by your health care provider.  Change your PICC dressing if it becomes loose or wet. WHAT SHOULD I KNOW ABOUT PICC CARE?  Check the PICC insertion site   daily for leakage, redness, swelling, or pain.  Do not take a bath, swim, or use hot tubs when you have a PICC. Cover PICC line with clear plastic wrap and tape to keep it dry while showering.  Flush the PICC as directed by your health care provider. Let your health care provider know right away if the PICC is difficult to flush or does not flush. Do not use force to flush the PICC.  Do not use a syringe that is less than 10 mL to flush the PICC.  Never pull or tug on the PICC.  Avoid blood pressure checks on the arm with the PICC.  Keep your PICC identification card with you at all  times.  Do not take the PICC out yourself. Only a trained clinical professional should remove the PICC. SEEK IMMEDIATE MEDICAL CARE IF:  Your PICC is accidentally pulled all the way out. If this happens, cover the insertion site with a bandage or gauze dressing. Do not throw the PICC away. Your health care provider will need to inspect it.  Your PICC was tugged or pulled and has partially come out. Do not  push the PICC back in.  There is any type of drainage, redness, or swelling where the PICC enters the skin.  You cannot flush the PICC, it is difficult to flush, or the PICC leaks around the insertion site when it is flushed.  You hear a "flushing" sound when the PICC is flushed.  You have pain, discomfort, or numbness in your arm, shoulder, or jaw on the same side as the PICC.  You feel your heart "racing" or skipping beats.  You notice a hole or tear in the PICC.  You develop chills or a fever. MAKE SURE YOU:   Understand these instructions.  Will watch your condition.  Will get help right away if you are not doing well or get worse. Document Released: 05/07/2003 Document Revised: 03/17/2014 Document Reviewed: 07/08/2013 Exit[employer] Patient Information 2015 [employer]. This information is not intended to replace advice given to you by your health care provider. Make sure you discuss any questions you have with your health care provider.  

## 2014-09-24 NOTE — Assessment & Plan Note (Signed)
I recommend a trial of over-the-counter sleeping aid. If she does not respond to it, I will also give her prescription of Ambien to take as needed.

## 2014-09-24 NOTE — Progress Notes (Signed)
Notified pt of urine pregnancy test needed when she comes in for PICC line flush on Friday. She verbalized understanding.

## 2014-09-24 NOTE — Progress Notes (Signed)
Her last dose of Lupron was on 10/22. I will get her next dose on 11/23 when she comes in for Neulasta injection for fertility preservation and prevention of menses while on chemotherapy.

## 2014-09-26 ENCOUNTER — Other Ambulatory Visit: Payer: Medicaid Other

## 2014-09-26 ENCOUNTER — Ambulatory Visit (HOSPITAL_BASED_OUTPATIENT_CLINIC_OR_DEPARTMENT_OTHER): Payer: Medicaid Other

## 2014-09-26 ENCOUNTER — Ambulatory Visit: Payer: Medicaid Other

## 2014-09-26 DIAGNOSIS — Z452 Encounter for adjustment and management of vascular access device: Secondary | ICD-10-CM

## 2014-09-26 DIAGNOSIS — C8330 Diffuse large B-cell lymphoma, unspecified site: Secondary | ICD-10-CM

## 2014-09-26 LAB — PREGNANCY, URINE: PREG TEST UR: NEGATIVE

## 2014-09-26 MED ORDER — SODIUM CHLORIDE 0.9 % IJ SOLN
10.0000 mL | INTRAMUSCULAR | Status: DC | PRN
  Administered 2014-09-26: 10 mL via INTRAVENOUS
  Filled 2014-09-26: qty 10

## 2014-09-26 MED ORDER — HEPARIN SOD (PORK) LOCK FLUSH 100 UNIT/ML IV SOLN
500.0000 [IU] | Freq: Once | INTRAVENOUS | Status: AC
  Administered 2014-09-26: 250 [IU] via INTRAVENOUS
  Filled 2014-09-26: qty 5

## 2014-09-26 NOTE — Patient Instructions (Signed)
PICC Home Guide A peripherally inserted central catheter (PICC) is a long, thin, flexible tube that is inserted into a vein in the upper arm. It is a form of intravenous (IV) access. It is considered to be a "central" line because the tip of the PICC ends in a large vein in your chest. This large vein is called the superior vena cava (SVC). The PICC tip ends in the SVC because there is a lot of blood flow in the SVC. This allows medicines and IV fluids to be quickly distributed throughout the body. The PICC is inserted using a sterile technique by a specially trained nurse or physician. After the PICC is inserted, a chest X-ray exam is done to be sure it is in the correct place.  A PICC may be placed for different reasons, such as:  To give medicines and liquid nutrition that can only be given through a central line. Examples are:  Certain antibiotic treatments.  Chemotherapy.  Total parenteral nutrition (TPN).  To take frequent blood samples.  To give IV fluids and blood products.  If there is difficulty placing a peripheral intravenous (PIV) catheter. If taken care of properly, a PICC can remain in place for several months. A PICC can also allow a person to go home from the hospital early. Medicine and PICC care can be managed at home by a family member or home health care team. WHAT PROBLEMS CAN HAPPEN WHEN I HAVE A PICC? Problems with a PICC can occasionally occur. These may include the following:  A blood clot (thrombus) forming in or at the tip of the PICC. This can cause the PICC to become clogged. A clot-dissolving medicine called tissue plasminogen activator (tPA) can be given through the PICC to help break up the clot.  Inflammation of the vein (phlebitis) in which the PICC is placed. Signs of inflammation may include redness, pain at the insertion site, red streaks, or being able to feel a "cord" in the vein where the PICC is located.  Infection in the PICC or at the insertion  site. Signs of infection may include fever, chills, redness, swelling, or pus drainage from the PICC insertion site.  PICC movement (malposition). The PICC tip may move from its original position due to excessive physical activity, forceful coughing, sneezing, or vomiting.  A break or cut in the PICC. It is important to not use scissors near the PICC.  Nerve or tendon irritation or injury during PICC insertion. WHAT SHOULD I KEEP IN MIND ABOUT ACTIVITIES WHEN I HAVE A PICC?  You may bend your arm and move it freely. If your PICC is near or at the bend of your elbow, avoid activity with repeated motion at the elbow.  Rest at home for the remainder of the day following PICC line insertion.  Avoid lifting heavy objects as instructed by your health care provider.  Avoid using a crutch with the arm on the same side as your PICC. You may need to use a walker. WHAT SHOULD I KNOW ABOUT MY PICC DRESSING?  Keep your PICC bandage (dressing) clean and dry to prevent infection.  Ask your health care provider when you may shower. Ask your health care provider to teach you how to wrap the PICC when you do take a shower.  Change the PICC dressing as instructed by your health care provider.  Change your PICC dressing if it becomes loose or wet. WHAT SHOULD I KNOW ABOUT PICC CARE?  Check the PICC insertion site   daily for leakage, redness, swelling, or pain.  Do not take a bath, swim, or use hot tubs when you have a PICC. Cover PICC line with clear plastic wrap and tape to keep it dry while showering.  Flush the PICC as directed by your health care provider. Let your health care provider know right away if the PICC is difficult to flush or does not flush. Do not use force to flush the PICC.  Do not use a syringe that is less than 10 mL to flush the PICC.  Never pull or tug on the PICC.  Avoid blood pressure checks on the arm with the PICC.  Keep your PICC identification card with you at all  times.  Do not take the PICC out yourself. Only a trained clinical professional should remove the PICC. SEEK IMMEDIATE MEDICAL CARE IF:  Your PICC is accidentally pulled all the way out. If this happens, cover the insertion site with a bandage or gauze dressing. Do not throw the PICC away. Your health care provider will need to inspect it.  Your PICC was tugged or pulled and has partially come out. Do not  push the PICC back in.  There is any type of drainage, redness, or swelling where the PICC enters the skin.  You cannot flush the PICC, it is difficult to flush, or the PICC leaks around the insertion site when it is flushed.  You hear a "flushing" sound when the PICC is flushed.  You have pain, discomfort, or numbness in your arm, shoulder, or jaw on the same side as the PICC.  You feel your heart "racing" or skipping beats.  You notice a hole or tear in the PICC.  You develop chills or a fever. MAKE SURE YOU:   Understand these instructions.  Will watch your condition.  Will get help right away if you are not doing well or get worse. Document Released: 05/07/2003 Document Revised: 03/17/2014 Document Reviewed: 07/08/2013 Exit[employer] Patient Information 2015 [employer]. This information is not intended to replace advice given to you by your health care provider. Make sure you discuss any questions you have with your health care provider.  

## 2014-09-29 ENCOUNTER — Inpatient Hospital Stay (HOSPITAL_COMMUNITY)
Admission: AD | Admit: 2014-09-29 | Discharge: 2014-10-03 | DRG: 847 | Disposition: A | Discharge status: Home / Self Care | Payer: Medicaid Other | Source: Ambulatory Visit | Attending: Hematology and Oncology | Admitting: Hematology and Oncology

## 2014-09-29 ENCOUNTER — Encounter (HOSPITAL_COMMUNITY): Payer: Self-pay | Admitting: *Deleted

## 2014-09-29 DIAGNOSIS — C8330 Diffuse large B-cell lymphoma, unspecified site: Secondary | ICD-10-CM | POA: Diagnosis present

## 2014-09-29 DIAGNOSIS — C8590 Non-Hodgkin lymphoma, unspecified, unspecified site: Secondary | ICD-10-CM

## 2014-09-29 DIAGNOSIS — D649 Anemia, unspecified: Secondary | ICD-10-CM | POA: Diagnosis present

## 2014-09-29 DIAGNOSIS — G4700 Insomnia, unspecified: Secondary | ICD-10-CM | POA: Diagnosis present

## 2014-09-29 DIAGNOSIS — D72829 Elevated white blood cell count, unspecified: Secondary | ICD-10-CM | POA: Diagnosis present

## 2014-09-29 DIAGNOSIS — Z79891 Long term (current) use of opiate analgesic: Secondary | ICD-10-CM | POA: Diagnosis not present

## 2014-09-29 DIAGNOSIS — Z5112 Encounter for antineoplastic immunotherapy: Secondary | ICD-10-CM

## 2014-09-29 DIAGNOSIS — Z79899 Other long term (current) drug therapy: Secondary | ICD-10-CM

## 2014-09-29 DIAGNOSIS — Z5111 Encounter for antineoplastic chemotherapy: Secondary | ICD-10-CM | POA: Diagnosis present

## 2014-09-29 DIAGNOSIS — Z8572 Personal history of non-Hodgkin lymphomas: Secondary | ICD-10-CM | POA: Diagnosis present

## 2014-09-29 MED ORDER — ETOPOSIDE CHEMO INJECTION 500 MG/25ML
INTRAVENOUS | Status: AC
  Administered 2014-09-29 – 2014-10-02 (×4): via INTRAVENOUS
  Filled 2014-09-29 (×5): qty 10

## 2014-09-29 MED ORDER — ONDANSETRON HCL 4 MG/2ML IJ SOLN: 4.0000 mg | Freq: Three times a day (TID) | INTRAMUSCULAR | Status: DC | PRN

## 2014-09-29 MED ORDER — SODIUM CHLORIDE 0.9 % IV SOLN: INTRAVENOUS | Status: DC

## 2014-09-29 MED ORDER — ALLOPURINOL 300 MG PO TABS
300.0000 mg | ORAL_TABLET | Freq: Once | ORAL | Status: AC
  Administered 2014-09-29: 300 mg via ORAL
  Filled 2014-09-29: qty 1

## 2014-09-29 MED ORDER — ALBUTEROL SULFATE (2.5 MG/3ML) 0.083% IN NEBU: 2.5000 mg | INHALATION_SOLUTION | Freq: Once | RESPIRATORY_TRACT | Status: AC | PRN

## 2014-09-29 MED ORDER — ALLOPURINOL 300 MG PO TABS
300.0000 mg | ORAL_TABLET | Freq: Every day | ORAL | Status: DC
  Administered 2014-09-30 – 2014-10-03 (×4): 300 mg via ORAL
  Filled 2014-09-29 (×4): qty 1

## 2014-09-29 MED ORDER — GUAIFENESIN-DM 100-10 MG/5ML PO SYRP: 10.0000 mL | ORAL_SOLUTION | ORAL | Status: DC | PRN

## 2014-09-29 MED ORDER — EPINEPHRINE HCL 0.1 MG/ML IJ SOSY
0.2500 mg | PREFILLED_SYRINGE | Freq: Once | INTRAMUSCULAR | Status: AC | PRN
  Filled 2014-09-29: qty 10

## 2014-09-29 MED ORDER — DIPHENHYDRAMINE HCL 50 MG/ML IJ SOLN: 50.0000 mg | Freq: Once | INTRAMUSCULAR | Status: AC | PRN

## 2014-09-29 MED ORDER — ONDANSETRON HCL 4 MG PO TABS: 4.0000 mg | ORAL_TABLET | Freq: Three times a day (TID) | ORAL | Status: DC | PRN

## 2014-09-29 MED ORDER — ONDANSETRON HCL 8 MG PO TABS: 8.0000 mg | ORAL_TABLET | Freq: Three times a day (TID) | ORAL | Status: DC | PRN

## 2014-09-29 MED ORDER — DIPHENHYDRAMINE HCL 50 MG/ML IJ SOLN: 25.0000 mg | Freq: Once | INTRAMUSCULAR | Status: AC | PRN

## 2014-09-29 MED ORDER — PROCHLORPERAZINE MALEATE 10 MG PO TABS: 10.0000 mg | ORAL_TABLET | Freq: Four times a day (QID) | ORAL | Status: DC | PRN

## 2014-09-29 MED ORDER — EPINEPHRINE HCL 1 MG/ML IJ SOLN
0.5000 mg | Freq: Once | INTRAMUSCULAR | Status: AC | PRN
  Filled 2014-09-29: qty 1

## 2014-09-29 MED ORDER — METHYLPREDNISOLONE SODIUM SUCC 125 MG IJ SOLR
125.0000 mg | Freq: Once | INTRAMUSCULAR | Status: AC | PRN
  Filled 2014-09-29: qty 2

## 2014-09-29 MED ORDER — FAMOTIDINE IN NACL 20-0.9 MG/50ML-% IV SOLN
20.0000 mg | Freq: Once | INTRAVENOUS | Status: AC | PRN
  Filled 2014-09-29: qty 50

## 2014-09-29 MED ORDER — HEPARIN SOD (PORK) LOCK FLUSH 100 UNIT/ML IV SOLN: 250.0000 [IU] | Freq: Once | INTRAVENOUS | Status: AC | PRN

## 2014-09-29 MED ORDER — ALLOPURINOL 300 MG PO TABS: 300.0000 mg | ORAL_TABLET | Freq: Every day | ORAL | Status: DC

## 2014-09-29 MED ORDER — SODIUM CHLORIDE 0.9 % IV SOLN: Freq: Once | INTRAVENOUS | Status: AC | PRN

## 2014-09-29 MED ORDER — ENOXAPARIN SODIUM 40 MG/0.4ML ~~LOC~~ SOLN
40.0000 mg | Freq: Every day | SUBCUTANEOUS | Status: DC
Start: 2014-09-29 — End: 2014-10-03
  Administered 2014-09-29 – 2014-10-02 (×4): 40 mg via SUBCUTANEOUS
  Filled 2014-09-29 (×5): qty 0.4

## 2014-09-29 MED ORDER — COLD PACK MISC ONCOLOGY
1.0000 | Freq: Once | Status: AC | PRN
  Filled 2014-09-29: qty 1

## 2014-09-29 MED ORDER — SODIUM CHLORIDE 0.9 % IV SOLN
INTRAVENOUS | Status: AC
  Administered 2014-09-29: 8 mg via INTRAVENOUS
  Administered 2014-09-30: 4 mg via INTRAVENOUS
  Administered 2014-10-01: 10 mg via INTRAVENOUS
  Administered 2014-10-02: 8 mg via INTRAVENOUS
  Filled 2014-09-29 (×7): qty 4

## 2014-09-29 MED ORDER — SODIUM CHLORIDE 0.9 % IJ SOLN: 10.0000 mL | INTRAMUSCULAR | Status: DC | PRN

## 2014-09-29 MED ORDER — HEPARIN SOD (PORK) LOCK FLUSH 100 UNIT/ML IV SOLN: 500.0000 [IU] | Freq: Once | INTRAVENOUS | Status: AC | PRN

## 2014-09-29 MED ORDER — HYDROCORTISONE 2.5 % RE CREA
1.0000 | TOPICAL_CREAM | Freq: Two times a day (BID) | RECTAL | Status: DC | PRN
Start: 2014-09-29 — End: 2014-10-03

## 2014-09-29 MED ORDER — SODIUM CHLORIDE 0.9 % IV SOLN
INTRAVENOUS | Status: DC
  Administered 2014-09-29 – 2014-10-03 (×6): via INTRAVENOUS

## 2014-09-29 MED ORDER — ALTEPLASE 2 MG IJ SOLR
2.0000 mg | Freq: Once | INTRAMUSCULAR | Status: AC | PRN
  Filled 2014-09-29: qty 2

## 2014-09-29 MED ORDER — SODIUM CHLORIDE 0.9 % IV SOLN: Freq: Once | INTRAVENOUS | Status: AC

## 2014-09-29 MED ORDER — ACETAMINOPHEN 325 MG PO TABS
650.0000 mg | ORAL_TABLET | Freq: Once | ORAL | Status: AC
  Administered 2014-09-29: 650 mg via ORAL
  Filled 2014-09-29: qty 2

## 2014-09-29 MED ORDER — RITUXIMAB CHEMO INJECTION 500 MG/50ML
375.0000 mg/m2 | Freq: Once | INTRAVENOUS | Status: AC
  Administered 2014-09-29: 800 mg via INTRAVENOUS
  Filled 2014-09-29: qty 80

## 2014-09-29 MED ORDER — DIPHENHYDRAMINE HCL 50 MG PO CAPS
50.0000 mg | ORAL_CAPSULE | Freq: Once | ORAL | Status: AC
  Administered 2014-09-29: 50 mg via ORAL
  Filled 2014-09-29: qty 1

## 2014-09-29 MED ORDER — ACETAMINOPHEN 500 MG PO TABS: 500.0000 mg | ORAL_TABLET | Freq: Four times a day (QID) | ORAL | Status: DC | PRN

## 2014-09-29 MED ORDER — ONDANSETRON 8 MG/NS 50 ML IVPB
8.0000 mg | Freq: Three times a day (TID) | INTRAVENOUS | Status: DC | PRN
  Filled 2014-09-29: qty 8

## 2014-09-29 MED ORDER — ACETAMINOPHEN 325 MG PO TABS: 650.0000 mg | ORAL_TABLET | ORAL | Status: DC | PRN

## 2014-09-29 MED ORDER — HYDROCODONE-ACETAMINOPHEN 5-325 MG PO TABS: 1.0000 | ORAL_TABLET | ORAL | Status: DC | PRN

## 2014-09-29 MED ORDER — HOT PACK MISC ONCOLOGY
1.0000 | Freq: Once | Status: AC | PRN
  Filled 2014-09-29: qty 1

## 2014-09-29 MED ORDER — PANTOPRAZOLE SODIUM 40 MG PO TBEC
40.0000 mg | DELAYED_RELEASE_TABLET | Freq: Every day | ORAL | Status: DC
  Administered 2014-09-30 – 2014-10-03 (×4): 40 mg via ORAL
  Filled 2014-09-29 (×4): qty 1

## 2014-09-29 MED ORDER — SODIUM CHLORIDE 0.9 % IJ SOLN: 3.0000 mL | INTRAMUSCULAR | Status: DC | PRN

## 2014-09-29 MED ORDER — ONDANSETRON 8 MG PO TBDP: 4.0000 mg | ORAL_TABLET | Freq: Three times a day (TID) | ORAL | Status: DC | PRN

## 2014-09-29 MED ORDER — SENNOSIDES-DOCUSATE SODIUM 8.6-50 MG PO TABS: 1.0000 | ORAL_TABLET | Freq: Two times a day (BID) | ORAL | Status: DC | PRN

## 2014-09-29 MED ORDER — ZOLPIDEM TARTRATE 5 MG PO TABS: 5.0000 mg | ORAL_TABLET | Freq: Every evening | ORAL | Status: DC | PRN

## 2014-09-29 MED ORDER — ALUM & MAG HYDROXIDE-SIMETH 200-200-20 MG/5ML PO SUSP: 60.0000 mL | ORAL | Status: DC | PRN

## 2014-09-29 NOTE — Plan of Care (Signed)
Problem: Phase I Progression Outcomes Goal: IV site with good blood return Outcome: Completed/Met Date Met:  09/29/14     

## 2014-09-29 NOTE — H&P (Signed)
[name]  [phone]    ADMISSION NOTE  Admitting MD:  Heath Lark, MD  Attending MD:  Heath Lark, MD  Reason for Admission: Cycle 2 of Chemotherapy with R-EPOCH for the treatment of Diffuse, large b Cel Lymphoma I have seen the patient, examined her and edited the notes as follows  HPI: Lindsey Irwin is an 18 y.o. female with a history of Diffuse large B Cell Lymphoma, admitted to the hospital for the initiation of Cycle 2 of Chemotherapy with R-EPOCH. She feels well. Afebrile. Denies fever, chills or night sweats. Denies shortness of breath or chest pain. Denies nausea, vomiting, diarrhea or mucositis. Appetite is normal. Denies constipation,last bowel movement today. Denies any headaches, vision changes or dizziness. Denies any rashes. No bleeding issues. Ambulating frequently. No confusion reported.   Oncology History   Diffuse large B cell lymphoma  Primary site: Lymphoid Neoplasms  Staging method: AJCC 6th Edition  Clinical: Stage IV signed by Heath Lark, MD on 09/03/2014 8:18 PM  Summary: Stage IV IPI of 3: high LDH, >2 extranodal disease and stage IV at presentation       Diffuse large B cell lymphoma   07/30/2014 Imaging She had a bilateral mammogram which came back abnormal. Ultrasound-guided biopsy was performed, suspicious for lymphoma   08/25/2014 Imaging PET/CT scan showed diffuse metastatic cancer involving bilateral breasts, bone, and large abdominal mass   08/28/2014 Surgery She had excisional surgery and removal of right breast mass   08/28/2014 Pathology Results Accession: [id number] right breast mass is consistent with diffuse large B-cell lymphoma with high proliferation index.   09/04/2014 - 09/11/2014 Hospital Admission she is admitted to the hospital for cycle 1 of [place]   09/04/2014 Imaging echocardiogram is normal.   09/04/2014 Procedure she has placement of PICC line.      PMH:  Past Medical History  Diagnosis Date  . Breast mass 08/2014  . Sore throat 08/25/2014  . Cough 08/2014  . Nasal congestion 08/2014  . Diffuse large B cell lymphoma 09/02/2014  . Insomnia 09/24/2014    Surgeries:  Past Surgical History  Procedure Laterality Date  . No past surgeries    . Breast biopsy Right 08/28/2014    Procedure: RIGHT EXCISIONAL BREAST BIOPSY;  Surgeon: Donnie Mesa, MD;  Location: [facility];  Service: General;  Laterality: Right;    Allergies:  Allergies  Allergen Reactions  . Other     NO BLOOD PRODUCTS. PT IS A JEHOVAH WITNESS.  Marland Kitchen Soap Itching    GAIN LAUNDRY DETERGENT Allergic to tide and surf also    Medications:   Prior to Admission:  Prescriptions prior to admission  Medication Sig Dispense Refill Last Dose  . acetaminophen (TYLENOL) 500 MG tablet Take 500 mg by mouth every 6 (six) hours as needed for headache.   Taking  . allopurinol (ZYLOPRIM) 300 MG tablet Take 1 tablet (300 mg total) by mouth daily. 30 tablet 0 Taking  . HYDROcodone-acetaminophen (NORCO/VICODIN) 5-325 MG per tablet Take 1 tablet by mouth every 4 (four) hours as needed. 40 tablet 0 Taking  . ondansetron (ZOFRAN) 8 MG tablet Take 1 tablet (8 mg total) by mouth every 8 (eight) hours as needed for nausea. 30 tablet 3 Taking  . pantoprazole (PROTONIX) 40 MG tablet Take 1 tablet (40 mg total) by mouth daily at 12 noon. 30 tablet 0 Taking  . prochlorperazine (COMPAZINE) 10 MG tablet Take 1 tablet (10 mg total) by mouth every 6 (six)  hours as needed for nausea. 30 tablet 3 Taking  . zolpidem (AMBIEN) 10 MG tablet Take 1 tablet (10 mg total) by mouth at bedtime as needed for sleep. 30 tablet 0     YJE:HUDJSH of Systems  Constitutional: Positive for weight loss. Negative for fever, chills and malaise/fatigue.  Eyes: Negative for blurred vision and double vision.  Respiratory: Negative for cough, hemoptysis  shortness of breath.  Cardiovascular:  Positive for chest pain. GI: No nausea vomiting diarrhea constipation. No change in bowel caliber. No  Melena   Hematochezia.  GU: No blood in urine. No loss of urinary control. Skin: Negative for itching. No rash. No petechia. No bruising Neurological: No headaches. No motor or sensory deficits. Review of all other organs systems are otherwise negative  Family History:  No family history on file.  Social History:  reports that she has never smoked. She has never used smokeless tobacco. She reports that she does not drink alcohol or use illicit drugs.  Physical Exam:   Filed Vitals:   09/29/14 0845  BP: 124/62  Pulse: 89  Temp: 98.2 F (36.8 C)  Resp: 62    18 y.o. female  in no acute distress, alert, conversant.  General well-developed, ill appearing  HEENT: Normocephalic, atraumatic, PERRLA. Oral cavity without thrush or lesions. Neck supple. no thyromegaly, no cervical or supraclavicular adenopathy  Lungs clear bilaterally . No wheezing, rhonchi or rales. Breasts: not examined. Cardiac regular rate and rhythm, no murmur , rubs or gallops Abdomen soft nontender , bowel sounds x4. No organomegaly GU/rectal: deferred. Extremities no clubbing cyanosis or edema. No bruising or petechial rash Neuro: non focal    LABS: CBC   Recent Labs Lab 09/24/14 0926  WBC 14.8*  HGB 12.1  HCT 37.9  PLT 264  MCV 84.6  MCH 26.9  MCHC 31.8  RDW 16.3*  LYMPHSABS 1.9  MONOABS 0.7  EOSABS 0.1  BASOSABS 0.2*     Anemia panel:  No results for input(s): VITAMINB12, FOLATE, FERRITIN, TIBC, IRON, RETICCTPCT in the last 72 hours.  No results for input(s): TSH, T4TOTAL, T3FREE, THYROIDAB in the last 72 hours.  Invalid input(s): FREET3   No results found for: ESRSEDRATE    CMP    Recent Labs Lab 09/24/14 0926  NA 143  K 3.7  CO2 27  GLUCOSE 109  BUN 8.6  CREATININE 0.8  CALCIUM 9.3  AST 8  ALT 9  ALKPHOS 108  BILITOT <0.20        Component Value Date/Time    BILITOT <0.20 09/24/2014 0926      No results for input(s): INR, PROTIME in the last 168 hours.  No results for input(s): DDIMER in the last 72 hours.    Imaging Studies: No results found.      A/P: 18 y.o. female   Diffuse large B cell lymphoma She has extensive stage IV disease with high-intermediate prognostic score (IPI=3).  Patient is being admitted fo  infusional R-EPOCH, D1C2 on 09/29/14 from day 1-5 and q 21 days, conventional way without dose adjustment. She will proceed with treatment along with prophylactic CNS treatment with intrathecal methotrexate on 10/01/2014 .  She will need Lupron injection to prevent menstruation and preserve fertility next week On 10/06/2014, she will receive Procrit injection along with Neulasta. She is on allopurinol to reduce the risk of tumor lysis syndrome prophylaxis  Jehova's witness Due to her religious beliefs, she will not receive blood products No signs of anemia noticed at this  time She will receive Procrit injection as preventive strategy against the need for transfusion.  DVT prophylaxis She will be on Lovenox and this can be discontinued if her platelet count drops to less than 50,000  Leukocytosis Due to recent Neulasta on 10/30, in the setting of lymphoma. She is afebrile No intervention indicated at this time  Full Code  [facility] 09/29/2014 9:25 AM Haddie Bruhl, MD 09/29/2014

## 2014-09-29 NOTE — Plan of Care (Signed)
Problem: Phase I Progression Outcomes Goal: IV site with good blood return Outcome: Completed/Met Date Met:  09/29/14 Goal: Hydration per MD order Outcome: Progressing Goal: OOB as tolerated unless otherwise ordered Outcome: Completed/Met Date Met:  09/29/14 Goal: Voiding-avoid urinary catheter unless indicated Outcome: Completed/Met Date Met:  09/29/14

## 2014-09-30 MED ORDER — SODIUM CHLORIDE 0.9 % IV SOLN
Freq: Once | INTRAVENOUS | Status: AC
  Administered 2014-10-01: 14:00:00 via INTRAVENOUS

## 2014-09-30 NOTE — Plan of Care (Signed)
Problem: Phase I Progression Outcomes Goal: Absence of IV chemotherapy infiltration Outcome: Progressing Goal: Initiate chemo per MD order Outcome: Progressing Goal: Hydration per MD order Outcome: Progressing Goal: Appropriate cumulative drug doses known Outcome: Progressing Goal: UOP 100 cc per hr for 2hrs pre Cisplatin Outcome: Progressing Goal: Pain controlled with appropriate interventions Outcome: Completed/Met Date Met:  09/30/14 Goal: Initial discharge plan identified Outcome: Progressing Goal: Hemodynamically stable Outcome: Progressing Goal: Other Phase I Outcomes/Goals Outcome: Progressing

## 2014-09-30 NOTE — Progress Notes (Signed)
Lindsey Irwin   DOB:[birth year]   YB#:[id number]   NID#:[id number]  Patient Care Team: No Pcp Per Patient as PCP - General (General Practice) I have seen the patient, examined her and edited the notes as follows  Subjective: She feels well. Afebrile. Tolerated day 1 chemo without complications. Denies fever, chills or night sweats. Denies shortness of breath or chest pain. Denies nausea, vomiting, diarrhea or mucositis. Appetite is normal. Last bowel movement 11/16. Denies any headaches, vision changes or dizziness. Denies any rashes. No bleeding issues. Ambulating frequently. No confusion reported.   Scheduled Meds: . allopurinol  300 mg Oral Daily  . DOXOrubicin/vinCRIStine/etoposide CHEMO IV infusion for Inpatient CI   Intravenous Q24H  . enoxaparin (LOVENOX) injection  40 mg Subcutaneous Q1200  . ondansetron [facility]) with dexamethasone (DECADRON) IV   Intravenous Q24H  . pantoprazole  40 mg Oral Q1200   Continuous Infusions: . sodium chloride 75 mL/hr at 09/29/14 2127  . sodium chloride     PRN Meds:acetaminophen, alum & mag hydroxide-simeth, guaiFENesin-dextromethorphan, HYDROcodone-acetaminophen, hydrocortisone, ondansetron **OR** ondansetron **OR** ondansetron (ZOFRAN) IV **OR** ondansetron (ZOFRAN) IV, prochlorperazine, senna-docusate, sodium chloride, sodium chloride, sodium chloride, sodium chloride, zolpidem   Objective:  Filed Vitals:   09/30/14 0549  BP: 132/68  Pulse: 68  Temp: 98.2 F (36.8 C)  Resp: 18      Intake/Output Summary (Last 24 hours) at 09/30/14 1443 Last data filed at 09/30/14 0700  Gross per 24 hour  Intake 2301.2 ml  Output   1800 ml  Net  501.2 ml    GENERAL:alert, no distress and comfortable SKIN: skin color, texture, turgor are normal, no rashes or significant lesions EYES: normal, conjunctiva are pink and non-injected, sclera clear OROPHARYNX:no exudate, no erythema and lips, buccal mucosa, and tongue normal  NECK: supple, thyroid normal  size, non-tender, without nodularity LYMPH:  no palpable lymphadenopathy in the cervical, axillary or inguinal LUNGS: clear to auscultation and percussion with normal breathing effort HEART: regular rate & rhythm and no murmurs and no lower extremity edema ABDOMEN:abdomen soft, non-tender and normal bowel sounds Musculoskeletal:no cyanosis of digits and no clubbing  PSYCH: alert & oriented x 3 with fluent speech NEURO: no focal motor/sensory deficits    CBG (last 3)  No results for input(s): GLUCAP in the last 72 hours.   Labs:   Recent Labs Lab 09/24/14 0926  WBC 14.8*  HGB 12.1  HCT 37.9  PLT 264  MCV 84.6  MCH 26.9  MCHC 31.8  RDW 16.3*  LYMPHSABS 1.9  MONOABS 0.7  EOSABS 0.1  BASOSABS 0.2*     Chemistries:    Recent Labs Lab 09/24/14 0926  NA 143  K 3.7  CO2 27  GLUCOSE 109  BUN 8.6  CREATININE 0.8  CALCIUM 9.3  AST 8  ALT 9  ALKPHOS 108  BILITOT <0.20    GFR Estimated Creatinine Clearance: 130.9 mL/min (by C-G formula based on Cr of 0.8).  Liver Function Tests:  Recent Labs Lab 09/24/14 0926  AST 8  ALT 9  ALKPHOS 108  BILITOT <0.20  PROT 6.5  ALBUMIN 3.5   No results for input(s): LIPASE, AMYLASE in the last 168 hours. No results for input(s): AMMONIA in the last 168 hours.  Urine Studies  No results found for: COLORURINE, APPEARANCEUR, LABSPEC, PHURINE, GLUCOSEU, HGBUR, BILIRUBINUR, KETONESUR, PROTEINUR, UROBILINOGEN, NITRITE, LEUKOCYTESUR    Imaging Studies:  No results found.  Assessment/Plan: 18 y.o.  Diffuse large B cell lymphoma She has extensive stage IV disease with high-intermediate  prognostic score (IPI=3).  Patient was admitted for infusional R-EPOCH, D1C2 on 09/29/14 from day 1-5 and q 21 days, conventional way without dose adjustment. She is to have prophylactic CNS treatment with intrathecal methotrexate on 10/01/2014 .  She will need Lupron injection to prevent menstruation and preserve fertility next week On  10/06/2014, she will receive Procrit injection along with Neulasta. She is on allopurinol to reduce the risk of tumor lysis syndrome prophylaxis  Jehova's witness Due to her religious beliefs, she will not receive blood products No signs of anemia noticed at this time She will receive Procrit injection as preventive strategy against the need for transfusion.  DVT prophylaxis She will be on Lovenox and this can be discontinued if her platelet count drops to less than 50,000  Leukocytosis Due to recent Neulasta on 10/30 and Decadron during this admission, in the setting of lymphoma.  She is afebrile No intervention indicated at this time  Full Code Other medical issues as per admitting team    **Disclaimer: This note was dictated with voice recognition software. Similar sounding words can inadvertently be transcribed and this note may contain transcription errors which may not have been corrected upon publication of note.Sharene Butters E, PA-C 09/30/2014  7:22 AM Claudell Wohler, MD 09/30/2014

## 2014-10-01 ENCOUNTER — Inpatient Hospital Stay (HOSPITAL_COMMUNITY): Payer: Medicaid Other

## 2014-10-01 DIAGNOSIS — D72829 Elevated white blood cell count, unspecified: Secondary | ICD-10-CM

## 2014-10-01 LAB — COMPREHENSIVE METABOLIC PANEL
ALBUMIN: 3.2 g/dL — AB (ref 3.5–5.2)
ALT: 7 U/L (ref 0–35)
ANION GAP: 10 (ref 5–15)
AST: 7 U/L (ref 0–37)
Alkaline Phosphatase: 83 U/L (ref 39–117)
BUN: 10 mg/dL (ref 6–23)
CALCIUM: 9.2 mg/dL (ref 8.4–10.5)
CHLORIDE: 106 meq/L (ref 96–112)
CO2: 25 mEq/L (ref 19–32)
Creatinine, Ser: 0.56 mg/dL (ref 0.50–1.10)
GFR calc Af Amer: >90 mL/min (ref >90)
GFR calc non Af Amer: >90 mL/min (ref >90)
Glucose, Bld: 162 mg/dL — ABNORMAL HIGH (ref 70–99)
Potassium: 3.8 mEq/L (ref 3.7–5.3)
Sodium: 141 mEq/L (ref 137–147)
TOTAL PROTEIN: 6.4 g/dL (ref 6.0–8.3)
Total Bilirubin: <0.2 mg/dL — ABNORMAL LOW (ref 0.3–1.2)

## 2014-10-01 LAB — CBC WITH DIFFERENTIAL/PLATELET
BASOS ABS: 0 10*3/uL (ref 0.0–0.1)
BASOS PCT: 0 % (ref 0–1)
EOS ABS: 0 10*3/uL (ref 0.0–0.7)
EOS PCT: 0 % (ref 0–5)
HCT: 35.9 % — ABNORMAL LOW (ref 36.0–46.0)
Hemoglobin: 11.6 g/dL — ABNORMAL LOW (ref 12.0–15.0)
Lymphocytes Relative: 4 % — ABNORMAL LOW (ref 12–46)
Lymphs Abs: 0.7 10*3/uL (ref 0.7–4.0)
MCH: 27.5 pg (ref 26.0–34.0)
MCHC: 32.3 g/dL (ref 30.0–36.0)
MCV: 85.1 fL (ref 78.0–100.0)
Monocytes Absolute: 0.4 10*3/uL (ref 0.1–1.0)
Monocytes Relative: 3 % (ref 3–12)
NEUTROS ABS: 14.9 10*3/uL — AB (ref 1.7–7.7)
Neutrophils Relative %: 93 % — ABNORMAL HIGH (ref 43–77)
Platelets: 372 10*3/uL (ref 150–400)
RBC: 4.22 MIL/uL (ref 3.87–5.11)
RDW: 15.6 % — AB (ref 11.5–15.5)
WBC: 16 10*3/uL — ABNORMAL HIGH (ref 4.0–10.5)

## 2014-10-01 LAB — D-DIMER, QUANTITATIVE (NOT AT ARMC): D DIMER QUANT: 0.29 ug{FEU}/mL (ref 0.00–0.48)

## 2014-10-01 MED ORDER — SODIUM CHLORIDE 0.9 % IJ SOLN
12.0000 mg | Freq: Once | INTRAMUSCULAR | Status: AC
  Administered 2014-10-01: 12 mg via INTRATHECAL
  Filled 2014-10-01: qty 0.48

## 2014-10-01 NOTE — Procedures (Signed)
Diagnostic Lumbar Puncture and Intrathecal administration of chemotherapy Procedure Note   Informed consent was obtained and potential risks including bleeding, infection and pain were reviewed with the patient.  The patient's name, date of birth, identification, consent and allergies were verified prior to the start of procedure and time out was performed.  The skin was prepped with Betadine solution.   5 cc of 1% lidocaine was used to provide local anaesthesia.   The L2/L3 intrathecal space was chosen as the site of procedure.  Despite multiple attempts, I failed to locate the intrathecal space. Procedure was abandoned.  Interventional radiologist was contacted and planned for intrathecal chemotherapy under fluoroscopy guidance

## 2014-10-01 NOTE — Care Management Note (Signed)
CARE MANAGEMENT NOTE 10/01/2014  Patient:  Lindsey Irwin, Lindsey Irwin   Account Number:  [id number]  Date Initiated:  10/01/2014  Documentation initiated by:  Marney Doctor  Subjective/Objective Assessment:   18 yo admitted with Diffuse Large B cell lymphoma.     Action/Plan:   From home with parent   Anticipated DC Date:  10/04/2014   Anticipated DC Plan:  [place]  CM consult      Choice offered to / List presented to:             Status of service:   Medicare Important Message given?   (If response is "NO", the following Medicare IM given date fields will be blank) Date Medicare IM given:   Medicare IM given by:   Date Additional Medicare IM given:   Additional Medicare IM given by:    Discharge Disposition:    Per UR Regulation:  Reviewed for med. necessity/level of care/duration of stay  If discussed at [place] of Stay Meetings, dates discussed:    Comments:  10/01/14 Marney Doctor RN,BSN,NCM [phone] Chart reviewed and CM following for DC needs.

## 2014-10-01 NOTE — Progress Notes (Signed)
Lindsey Irwin   DOB:August 13, 1996   BZ#:[id number]   BOF#:[id number]  Patient Care Team: No Pcp Per Patient as PCP - General (General Practice)  I have seen the patient, examined her and edited the notes as follows  Subjective: She feels well. Afebrile.Tolerating chemo without significant side effects. Denies fever, chills or night sweats. Denies shortness of breath or chest pain. Denies nausea, vomiting, diarrhea or mucositis. Appetite is normal. Last bowel movement 11/17. Denies any headaches, vision changes or dizziness. Denies any rashes. No bleeding issues. Ambulating frequently. No confusion reported.   Scheduled Meds: . sodium chloride   Intravenous Once  . allopurinol  300 mg Oral Daily  . DOXOrubicin/vinCRIStine/etoposide CHEMO IV infusion for Inpatient CI   Intravenous Q24H  . enoxaparin (LOVENOX) injection  40 mg Subcutaneous Q1200  . ondansetron [facility]) with dexamethasone (DECADRON) IV   Intravenous Q24H  . pantoprazole  40 mg Oral Q1200   Continuous Infusions: . sodium chloride 75 mL/hr at 10/01/14 0230  . sodium chloride     PRN Meds:acetaminophen, alum & mag hydroxide-simeth, guaiFENesin-dextromethorphan, HYDROcodone-acetaminophen, hydrocortisone, ondansetron **OR** ondansetron **OR** ondansetron (ZOFRAN) IV **OR** ondansetron (ZOFRAN) IV, prochlorperazine, senna-docusate, sodium chloride, sodium chloride, sodium chloride, sodium chloride, zolpidem   Objective:  Filed Vitals:   10/01/14 0607  BP: 110/60  Pulse:   Temp:   Resp:       Intake/Output Summary (Last 24 hours) at 10/01/14 0715 Last data filed at 10/01/14 0534  Gross per 24 hour  Intake   2208 ml  Output   1000 ml  Net   1208 ml    GENERAL:alert, no distress and comfortable SKIN: skin color, texture, turgor are normal, no rashes or significant lesions EYES: normal, conjunctiva are pink and non-injected, sclera clear OROPHARYNX:no exudate, no erythema and lips, buccal mucosa, and tongue normal  NECK:  supple, thyroid normal size, non-tender, without nodularity LYMPH:  no palpable lymphadenopathy in the cervical, axillary or inguinal LUNGS: clear to auscultation and percussion with normal breathing effort HEART: regular rate & rhythm and no murmurs and no lower extremity edema ABDOMEN:abdomen soft, non-tender and normal bowel sounds Musculoskeletal:no cyanosis of digits and no clubbing  PSYCH: alert & oriented x 3 with fluent speech NEURO: no focal motor/sensory deficits    CBG (last 3)  No results for input(s): GLUCAP in the last 72 hours.   Labs:   Recent Labs Lab 09/24/14 0926 10/01/14 0545  WBC 14.8* 16.0*  HGB 12.1 11.6*  HCT 37.9 35.9*  PLT 264 372  MCV 84.6 85.1  MCH 26.9 27.5  MCHC 31.8 32.3  RDW 16.3* 15.6*  LYMPHSABS 1.9 0.7  MONOABS 0.7 0.4  EOSABS 0.1 0.0  BASOSABS 0.2* 0.0     Chemistries:    Recent Labs Lab 09/24/14 0926 10/01/14 0545  NA 143 141  K 3.7 3.8  CL  --  106  CO2 27 25  GLUCOSE 109 162*  BUN 8.6 10  CREATININE 0.8 0.56  CALCIUM 9.3 9.2  AST 8 7  ALT 9 7  ALKPHOS 108 83  BILITOT <0.20 <0.2*    GFR Estimated Creatinine Clearance: 130.9 mL/min (by C-G formula based on Cr of 0.56).  Liver Function Tests:  Recent Labs Lab 09/24/14 0926 10/01/14 0545  AST 8 7  ALT 9 7  ALKPHOS 108 83  BILITOT <0.20 <0.2*  PROT 6.5 6.4  ALBUMIN 3.5 3.2*  Assessment/Plan: 18 y.o.  Diffuse large B cell lymphoma She has extensive stage IV disease with high-intermediate prognostic score (IPI=3).  Patient was admitted for infusional R-EPOCH, D1C2 on 09/29/14 from day 1-5 and q 21 days, conventional way without dose adjustment. She is to have prophylactic CNS treatment with intrathecal methotrexate on 10/01/2014. See procedures notes She will need Lupron injection to prevent menstruation and preserve fertility next week On 10/06/2014, she will receive Procrit injection along with Neulasta. She is on allopurinol to reduce the risk of tumor  lysis syndrome prophylaxis  Jehova's witness Due to her religious beliefs, she will not receive blood products She has mild anemia, with a Hb 11.6 but she is asymptomatic at this time She receives Procrit injection as preventive strategy against the need for transfusion.  DVT prophylaxis She is on Lovenox and this can be discontinued if her platelet count drops to less than 50,000  Leukocytosis Due to recent Neulasta on 10/30 and Decadron during this admission, in the setting of lymphoma. She is afebrile No intervention indicated at this time  Full Code Other medical issues as per admitting team    **Disclaimer: This note was dictated with voice recognition software. Similar sounding words can inadvertently be transcribed and this note may contain transcription errors which may not have been corrected upon publication of note.** LindseySARA E, PA-C 10/01/2014  7:15 AM Lindsey Harlan, MD 10/01/2014 Total contact time 45 mins

## 2014-10-02 NOTE — Progress Notes (Signed)
Patient tolerating her chemotherapy very well. No complaints,picc line remained with very good blood return.no redness or swelling on iv site.

## 2014-10-02 NOTE — Progress Notes (Signed)
Lindsey Irwin   DOB:1996/04/20   ZO#:[id number]   JWJ#:[id number]  Patient Care Team: No Pcp Per Patient as PCP - General (General Practice) I have seen the patient, examined her and edited the notes as follows  Subjective: She feels well. Afebrile.Tolerating chemo without significant side effects. Denies fever, chills or night sweats. Denies shortness of breath or chest pain. Denies nausea, vomiting, diarrhea or mucositis. Appetite is normal. Last bowel movement 11/18. Denies any headaches, vision changes or dizziness. Denies any rashes. No bleeding issues. Ambulating frequently. No confusion reported.   Scheduled Meds: . allopurinol  300 mg Oral Daily  . DOXOrubicin/vinCRIStine/etoposide CHEMO IV infusion for Inpatient CI   Intravenous Q24H  . enoxaparin (LOVENOX) injection  40 mg Subcutaneous Q1200  . ondansetron [facility]) with dexamethasone (DECADRON) IV   Intravenous Q24H  . pantoprazole  40 mg Oral Q1200   Continuous Infusions: . sodium chloride 75 mL/hr at 10/01/14 0230  . sodium chloride     PRN Meds:acetaminophen, alum & mag hydroxide-simeth, guaiFENesin-dextromethorphan, HYDROcodone-acetaminophen, hydrocortisone, ondansetron **OR** ondansetron **OR** ondansetron (ZOFRAN) IV **OR** ondansetron (ZOFRAN) IV, prochlorperazine, senna-docusate, sodium chloride, sodium chloride, sodium chloride, sodium chloride, zolpidem   Objective:  Filed Vitals:   10/02/14 0656  BP: 125/58  Pulse: 63  Temp: 98 F (36.7 C)  Resp: 20      Intake/Output Summary (Last 24 hours) at 10/02/14 6213 Last data filed at 10/01/14 2054  Gross per 24 hour  Intake    240 ml  Output   1400 ml  Net  -1160 ml    GENERAL:alert, no distress and comfortable SKIN: skin color, texture, turgor are normal, no rashes or significant lesions EYES: normal, conjunctiva are pink and non-injected, sclera clear OROPHARYNX:no exudate, no erythema and lips, buccal mucosa, and tongue normal  NECK: supple, thyroid normal  size, non-tender, without nodularity LYMPH:  no palpable lymphadenopathy in the cervical, axillary or inguinal LUNGS: clear to auscultation and percussion with normal breathing effort HEART: regular rate & rhythm and no murmurs and no lower extremity edema ABDOMEN:abdomen soft, non-tender and normal bowel sounds Musculoskeletal:no cyanosis of digits and no clubbing  PSYCH: alert & oriented x 3 with fluent speech NEURO: no focal motor/sensory deficits    CBG (last 3)  No results for input(s): GLUCAP in the last 72 hours.   Labs:   Recent Labs Lab 10/01/14 0545  WBC 16.0*  HGB 11.6*  HCT 35.9*  PLT 372  MCV 85.1  MCH 27.5  MCHC 32.3  RDW 15.6*  LYMPHSABS 0.7  MONOABS 0.4  EOSABS 0.0  BASOSABS 0.0     Chemistries:    Recent Labs Lab 10/01/14 0545  NA 141  K 3.8  CL 106  CO2 25  GLUCOSE 162*  BUN 10  CREATININE 0.56  CALCIUM 9.2  AST 7  ALT 7  ALKPHOS 83  BILITOT <0.2*    GFR Estimated Creatinine Clearance: 132.5 mL/min (by C-G formula based on Cr of 0.56).  Liver Function Tests:  Recent Labs Lab 10/01/14 0545  AST 7  ALT 7  ALKPHOS 83  BILITOT <0.2*  PROT 6.4  ALBUMIN 3.2*  Assessment/Plan: 18 y.o.  Diffuse large B cell lymphoma She has extensive stage IV disease with high-intermediate prognostic score (IPI=3).  Patient was admitted for infusional R-EPOCH, D1C2 on 09/29/14 from day 1-5 and q 21 days, conventional way without dose adjustment. She was scheduled for  prophylactic CNS treatment with intrathecal methotrexate on 10/01/2014. The L2-L3 intrathecal space was chosen for the procedure  site, but aborted after multiple failed attempts to locate this space. She had this procedure by Interventional Radiologist yesterday. She will need Lupron injection to prevent menstruation and preserve fertility next week On 10/06/2014, she will receive Procrit injection along with Neulasta. She is on allopurinol to reduce the risk of tumor lysis syndrome  prophylaxis  Jehova's witness Due to her religious beliefs, she will not receive blood products She has mild anemia, with a Hb 11.6 but she is asymptomatic at this time She receives Procrit injection as preventive strategy against the need for transfusion.  DVT prophylaxis She is on Lovenox and this can be discontinued if her platelet count drops to less than 50,000  Leukocytosis Due to recent Neulasta on 10/30 and Decadron during this admission, in the setting of lymphoma. She is afebrile No intervention indicated at this time  Full Code Other medical issues as per admitting team Plan to discharge tomorrow after completion of therapy.  **Disclaimer: This note was dictated with voice recognition software. Similar sounding words can inadvertently be transcribed and this note may contain transcription errors which may not have been corrected upon publication of note.Sharene Butters E, PA-C 10/02/2014  7:27 AM  Zavier Canela, MD 10/02/2014

## 2014-10-03 MED ORDER — HEPARIN SOD (PORK) LOCK FLUSH 100 UNIT/ML IV SOLN
250.0000 [IU] | INTRAVENOUS | Status: DC | PRN
  Filled 2014-10-03: qty 5

## 2014-10-03 MED ORDER — CYCLOPHOSPHAMIDE CHEMO INJECTION 1 GM
750.0000 mg/m2 | Freq: Once | INTRAMUSCULAR | Status: AC
  Administered 2014-10-03: 1520 mg via INTRAVENOUS
  Filled 2014-10-03: qty 76

## 2014-10-03 MED ORDER — SODIUM CHLORIDE 0.9 % IV SOLN
Freq: Once | INTRAVENOUS | Status: AC
  Administered 2014-10-03: 16 mg via INTRAVENOUS
  Filled 2014-10-03: qty 8

## 2014-10-03 NOTE — Plan of Care (Signed)
Problem: Phase II Progression Outcomes Goal: Tolerating diet Outcome: Completed/Met Date Met:  10/03/14     

## 2014-10-03 NOTE — Plan of Care (Signed)
Problem: Phase II Progression Outcomes Goal: Absence of IV chemotherapy infiltration Outcome: Completed/Met Date Met:  10/03/14

## 2014-10-03 NOTE — Progress Notes (Signed)
Nursing Discharge Summary  Patient ID: Lindsey Irwin MRN: [id number] DOB/AGE: 22-Apr-1996 18 y.o.  Admit date: 09/29/2014 Discharge date: 10/03/2014  Discharged Condition: good  Disposition: 01-Home or Self Care    Prescriptions Given: None given  Means of Discharge: Patient to be transported home via private vehicle with mother. Patient follow up appointments and medications discussed. Patient verbalized understanding of symptom management after chemotherapy. No further questions.  Signed: Buel Ream, RN

## 2014-10-03 NOTE — Discharge Summary (Signed)
Patient ID: Ryli Standlee MRN: [id number] DOB/AGE: 07/13/1996 18 y.o.  I have seen the patient, examined her and edited the notes as follows  Admit date: 09/29/2014 Discharge date: 10/03/2014  Patient Care Team: No Pcp Per Patient as PCP - General (General Practice)  Discharge Diagnoses:   Present on Admission:  . Diffuse large B cell lymphoma Past Medical History  Diagnosis Date  . Breast mass 08/2014  . Sore throat 08/25/2014  . Cough 08/2014  . Nasal congestion 08/2014  . Diffuse large B cell lymphoma 09/02/2014  . Insomnia 09/24/2014    Discharge Medications:    Medication List    STOP taking these medications        acetaminophen 500 MG tablet  Commonly known as:  TYLENOL      TAKE these medications        allopurinol 300 MG tablet  Commonly known as:  ZYLOPRIM  Take 1 tablet (300 mg total) by mouth daily.     HYDROcodone-acetaminophen 5-325 MG per tablet  Commonly known as:  NORCO/VICODIN  Take 1 tablet by mouth every 4 (four) hours as needed.     ondansetron 8 MG tablet  Commonly known as:  ZOFRAN  Take 1 tablet (8 mg total) by mouth every 8 (eight) hours as needed for nausea.     pantoprazole 40 MG tablet  Commonly known as:  PROTONIX  Take 1 tablet (40 mg total) by mouth daily at 12 noon.     prochlorperazine 10 MG tablet  Commonly known as:  COMPAZINE  Take 1 tablet (10 mg total) by mouth every 6 (six) hours as needed for nausea.     zolpidem 10 MG tablet  Commonly known as:  AMBIEN  Take 1 tablet (10 mg total) by mouth at bedtime as needed for sleep.        Discharge Condition: Improved.  Diet recommendation: Regular.  Disposition and Follow-up:  Discharge to home today. She will return on 10/23 to the [facility] for Procrit injection and Neulasta She will need Lupron injection to prevent menstruation and preserve fertility next week as well.   ECOG PERFORMANCE STATUS: 0   Brief History of Present Illness: Izzah Abdou is  an 18 y.o. female with a history of Diffuse large B Cell Lymphoma, admitted on 09/29/14 to the hospital for the initiation of Cycle 2 of Chemotherapy with R-EPOCH. She was asymptomatic at the time of admission.   Hospital Course:   Diffuse large B cell lymphoma She has extensive stage IV disease with high-intermediate prognostic score (IPI=3).  Patient was admitted for infusional R-EPOCH, D1C2 on 09/29/14 from day 1-5 and q 21 days, conventional way without dose adjustment. She was scheduled for prophylactic CNS treatment with intrathecal methotrexate on 10/01/2014. The L2-L3 intrathecal space was chosen for the procedure site, but aborted after multiple failed attempts to locate this space.  She had this procedure by Interventional Radiologist on 23/76 without complications She will need Lupron injection to prevent menstruation and preserve fertility next week On 10/06/2014, she will receive Procrit injection along with Neulasta. She is on allopurinol to reduce the risk of tumor lysis syndrome   Jehova's witness Due to her religious beliefs, she does not receive blood products She has mild anemia, with a Hb 11.6 but she remained asymptomatic She receives Procrit injection as preventive strategy against the need for transfusion. next dose due on 10/23  DVT prophylaxis She remained on Lovenox as her platelets remained normal  Leukocytosis Due  to recent Neulasta on 10/30 and Decadron during this admission, in the setting of lymphoma. Her WBC on 11/17 was 16.0 She remained afebrile through all the hospitalization  Full Code  Physical Exam at Discharge: BP 132/68 mmHg  Pulse 56  Temp(Src) 97.8 F (36.6 C) (Oral)  Resp 20  Ht 5\' 6"  (1.676 m)  Wt 211 lb 6.7 oz (95.9 kg)  BMI 34.14 kg/m2  SpO2 100%   Subjective: She feels well. Afebrile.Tolerating chemo without significant side effects. Denies fever, chills or night sweats. Denies shortness of breath or chest pain. Denies nausea,  vomiting, diarrhea or mucositis. Appetite is normal. Last bowel movement 11/19. Denies any headaches, vision changes or dizziness. Denies any rashes. No bleeding issues. Ambulating frequently. No confusion reported.  Objective GENERAL:alert, no distress and comfortable SKIN: skin color, texture, turgor are normal, no rashes or significant lesions EYES: normal, conjunctiva are pink and non-injected, sclera clear OROPHARYNX:no exudate, no erythema and lips, buccal mucosa, and tongue normal  NECK: supple, thyroid normal size, non-tender, without nodularity LYMPH:  no palpable lymphadenopathy in the cervical, axillary or inguinal LUNGS: clear to auscultation and percussion with normal breathing effort HEART: regular rate & rhythm and no murmurs and no lower extremity edema ABDOMEN:abdomen soft, non-tender and normal bowel sounds Musculoskeletal:no cyanosis of digits and no clubbing  PSYCH: alert & oriented x 3 with fluent speech NEURO: no focal motor/sensory deficits    Significant Diagnostic Studies:  Dg Fluoro Guide Lumbar Puncture  10/01/2014   CLINICAL DATA:  Lumbar puncture for chemotherapy administration, non-Hodgkin's lymphoma  EXAM: FLUOROSCOPICALLY GUIDED LUMBAR PUNCTURE FOR INTRATHECAL  CHEMOTHERAPY  TECHNIQUE: Informed consent was obtained from the patient prior to the procedure, including potential complications of headache, allergy, and pain. A 'time out' was performed. With the patient prone, the lower back was prepped with Betadine. 1% Lidocaine was used for local anesthesia. Lumbar puncture was performed at the L4-5 level using a 22 gauge  needle with return of clear CSF. 5 mL of methotrexate was injected into the subarachnoid space. The patient tolerated the procedure well without apparent complication.  FLUOROSCOPY TIME:  Fifteen second  IMPRESSION: Intrathecal injection of chemotherapy without complication.   Electronically Signed   By: Julian Hy M.D.   On: 10/01/2014 16:52     Discharge Laboratory Values:  CBC  Recent Labs Lab 10/01/14 0545  WBC 16.0*  HGB 11.6*  HCT 35.9*  PLT 372  MCV 85.1  MCH 27.5  MCHC 32.3  RDW 15.6*  LYMPHSABS 0.7  MONOABS 0.4  EOSABS 0.0  BASOSABS 0.0    Chemistries   Recent Labs Lab 10/01/14 0545  NA 141  K 3.8  CL 106  CO2 25  GLUCOSE 162*  BUN 10  CREATININE 0.56  CALCIUM 9.2   Signed: Sharene Butters, PA-C 10/03/2014, 7:58 AM Amitai Delaughter, MD 10/03/2014 Spent 40 minutes on discharge

## 2014-10-03 NOTE — Plan of Care (Signed)
Problem: Phase II Progression Outcomes Goal: Nausea and vomiting controlled Outcome: Completed/Met Date Met:  10/03/14

## 2014-10-03 NOTE — Plan of Care (Signed)
Problem: Phase II Progression Outcomes Goal: Pain controlled Outcome: Not Applicable Date Met:  74/82/70

## 2014-10-03 NOTE — Plan of Care (Signed)
Problem: Phase I Progression Outcomes Goal: Hemodynamically stable Outcome: Completed/Met Date Met:  10/03/14

## 2014-10-03 NOTE — Progress Notes (Signed)
Cyclophosphamide dose - checked individually with Laural Benes, RN to verify accuracy.

## 2014-10-06 ENCOUNTER — Ambulatory Visit (HOSPITAL_BASED_OUTPATIENT_CLINIC_OR_DEPARTMENT_OTHER): Payer: Medicaid Other

## 2014-10-06 ENCOUNTER — Ambulatory Visit: Payer: Medicaid Other

## 2014-10-06 DIAGNOSIS — Z79899 Other long term (current) drug therapy: Secondary | ICD-10-CM

## 2014-10-06 DIAGNOSIS — Z5111 Encounter for antineoplastic chemotherapy: Secondary | ICD-10-CM

## 2014-10-06 DIAGNOSIS — C8338 Diffuse large B-cell lymphoma, lymph nodes of multiple sites: Secondary | ICD-10-CM

## 2014-10-06 DIAGNOSIS — Z452 Encounter for adjustment and management of vascular access device: Secondary | ICD-10-CM

## 2014-10-06 DIAGNOSIS — Z789 Other specified health status: Secondary | ICD-10-CM

## 2014-10-06 DIAGNOSIS — C8330 Diffuse large B-cell lymphoma, unspecified site: Secondary | ICD-10-CM

## 2014-10-06 DIAGNOSIS — IMO0001 Reserved for inherently not codable concepts without codable children: Secondary | ICD-10-CM

## 2014-10-06 MED ORDER — LEUPROLIDE ACETATE 7.5 MG IM KIT
7.5000 mg | PACK | INTRAMUSCULAR | Status: DC
  Administered 2014-10-06: 7.5 mg via INTRAMUSCULAR
  Filled 2014-10-06: qty 7.5

## 2014-10-06 MED ORDER — SODIUM CHLORIDE 0.9 % IJ SOLN
10.0000 mL | INTRAMUSCULAR | Status: DC | PRN
  Administered 2014-10-06: 10 mL via INTRAVENOUS
  Filled 2014-10-06: qty 10

## 2014-10-06 MED ORDER — EPOETIN ALFA 40000 UNIT/ML IJ SOLN
40000.0000 [IU] | INTRAMUSCULAR | Status: DC
  Administered 2014-10-06: 40000 [IU] via SUBCUTANEOUS
  Filled 2014-10-06: qty 1

## 2014-10-06 MED ORDER — PEGFILGRASTIM INJECTION 6 MG/0.6ML
6.0000 mg | Freq: Once | SUBCUTANEOUS | Status: AC
  Administered 2014-10-06: 6 mg via SUBCUTANEOUS
  Filled 2014-10-06: qty 0.6

## 2014-10-06 MED ORDER — HEPARIN SOD (PORK) LOCK FLUSH 100 UNIT/ML IV SOLN
500.0000 [IU] | Freq: Once | INTRAVENOUS | Status: AC
  Administered 2014-10-06: 250 [IU] via INTRAVENOUS
  Filled 2014-10-06: qty 5

## 2014-10-06 NOTE — Progress Notes (Signed)
Okay to give Procrit injection today per Lattie Haw in Pharmacy who spoke with [facility] Team Lead.

## 2014-10-06 NOTE — Patient Instructions (Addendum)
PICC Home Guide A peripherally inserted central catheter (PICC) is a long, thin, flexible tube that is inserted into a vein in the upper arm. It is a form of intravenous (IV) access. It is considered to be a "central" line because the tip of the PICC ends in a large vein in your chest. This large vein is called the superior vena cava (SVC). The PICC tip ends in the SVC because there is a lot of blood flow in the SVC. This allows medicines and IV fluids to be quickly distributed throughout the body. The PICC is inserted using a sterile technique by a specially trained nurse or physician. After the PICC is inserted, a chest X-ray exam is done to be sure it is in the correct place.  A PICC may be placed for different reasons, such as:  To give medicines and liquid nutrition that can only be given through a central line. Examples are:  Certain antibiotic treatments.  Chemotherapy.  Total parenteral nutrition (TPN).  To take frequent blood samples.  To give IV fluids and blood products.  If there is difficulty placing a peripheral intravenous (PIV) catheter. If taken care of properly, a PICC can remain in place for several months. A PICC can also allow a person to go home from the hospital early. Medicine and PICC care can be managed at home by a family member or home health care team. WHAT PROBLEMS CAN HAPPEN WHEN I HAVE A PICC? Problems with a PICC can occasionally occur. These may include the following:  A blood clot (thrombus) forming in or at the tip of the PICC. This can cause the PICC to become clogged. A clot-dissolving medicine called tissue plasminogen activator (tPA) can be given through the PICC to help break up the clot.  Inflammation of the vein (phlebitis) in which the PICC is placed. Signs of inflammation may include redness, pain at the insertion site, red streaks, or being able to feel a "cord" in the vein where the PICC is located.  Infection in the PICC or at the insertion  site. Signs of infection may include fever, chills, redness, swelling, or pus drainage from the PICC insertion site.  PICC movement (malposition). The PICC tip may move from its original position due to excessive physical activity, forceful coughing, sneezing, or vomiting.  A break or cut in the PICC. It is important to not use scissors near the PICC.  Nerve or tendon irritation or injury during PICC insertion. WHAT SHOULD I KEEP IN MIND ABOUT ACTIVITIES WHEN I HAVE A PICC?  You may bend your arm and move it freely. If your PICC is near or at the bend of your elbow, avoid activity with repeated motion at the elbow.  Rest at home for the remainder of the day following PICC line insertion.  Avoid lifting heavy objects as instructed by your health care provider.  Avoid using a crutch with the arm on the same side as your PICC. You may need to use a walker. WHAT SHOULD I KNOW ABOUT MY PICC DRESSING?  Keep your PICC bandage (dressing) clean and dry to prevent infection.  Ask your health care provider when you may shower. Ask your health care provider to teach you how to wrap the PICC when you do take a shower.  Change the PICC dressing as instructed by your health care provider.  Change your PICC dressing if it becomes loose or wet. WHAT SHOULD I KNOW ABOUT PICC CARE?  Check the PICC insertion site   daily for leakage, redness, swelling, or pain.  Do not take a bath, swim, or use hot tubs when you have a PICC. Cover PICC line with clear plastic wrap and tape to keep it dry while showering.  Flush the PICC as directed by your health care provider. Let your health care provider know right away if the PICC is difficult to flush or does not flush. Do not use force to flush the PICC.  Do not use a syringe that is less than 10 mL to flush the PICC.  Never pull or tug on the PICC.  Avoid blood pressure checks on the arm with the PICC.  Keep your PICC identification card with you at all  times.  Do not take the PICC out yourself. Only a trained clinical professional should remove the PICC. SEEK IMMEDIATE MEDICAL CARE IF:  Your PICC is accidentally pulled all the way out. If this happens, cover the insertion site with a bandage or gauze dressing. Do not throw the PICC away. Your health care provider will need to inspect it.  Your PICC was tugged or pulled and has partially come out. Do not  push the PICC back in.  There is any type of drainage, redness, or swelling where the PICC enters the skin.  You cannot flush the PICC, it is difficult to flush, or the PICC leaks around the insertion site when it is flushed.  You hear a "flushing" sound when the PICC is flushed.  You have pain, discomfort, or numbness in your arm, shoulder, or jaw on the same side as the PICC.  You feel your heart "racing" or skipping beats.  You notice a hole or tear in the PICC.  You develop chills or a fever. MAKE SURE YOU:   Understand these instructions.  Will watch your condition.  Will get help right away if you are not doing well or get worse. Document Released: 05/07/2003 Document Revised: 03/17/2014 Document Reviewed: 07/08/2013 [facility] Patient Information 2015 [place], [place]. This information is not intended to replace advice given to you by your health care provider. Make sure you discuss any questions you have with your health care provider.   Epoetin Alfa injection What is this medicine? EPOETIN ALFA (e POE e tin AL fa) helps your body make more red blood cells. This medicine is used to treat anemia caused by chronic kidney failure, cancer chemotherapy, or HIV-therapy. It may also be used before surgery if you have anemia. This medicine may be used for other purposes; ask your health care provider or pharmacist if you have questions. COMMON BRAND NAME(S): Epogen, Procrit What should I tell my health care provider before I take this medicine? They need to know if you have any  of these conditions: -blood clotting disorders -cancer patient not on chemotherapy -cystic fibrosis -heart disease, such as angina or heart failure -hemoglobin level of 12 g/dL or greater -high blood pressure -low levels of folate, iron, or vitamin B12 -seizures -an unusual or allergic reaction to erythropoietin, albumin, benzyl alcohol, hamster proteins, other medicines, foods, dyes, or preservatives -pregnant or trying to get pregnant -breast-feeding How should I use this medicine? This medicine is for injection into a vein or under the skin. It is usually given by a health care professional in a hospital or clinic setting. If you get this medicine at home, you will be taught how to prepare and give this medicine. Use exactly as directed. Take your medicine at regular intervals. Do not take your medicine  more often than directed. It is important that you put your used needles and syringes in a special sharps container. Do not put them in a trash can. If you do not have a sharps container, call your pharmacist or healthcare provider to get one. Talk to your pediatrician regarding the use of this medicine in children. While this drug may be prescribed for selected conditions, precautions do apply. Overdosage: If you think you have taken too much of this medicine contact a poison control center or emergency room at once. NOTE: This medicine is only for you. Do not share this medicine with others. What if I miss a dose? If you miss a dose, take it as soon as you can. If it is almost time for your next dose, take only that dose. Do not take double or extra doses. What may interact with this medicine? Do not take this medicine with any of the following medications: -darbepoetin alfa This list may not describe all possible interactions. Give your health care provider a list of all the medicines, herbs, non-prescription drugs, or dietary supplements you use. Also tell them if you smoke, drink  alcohol, or use illegal drugs. Some items may interact with your medicine. What should I watch for while using this medicine? Visit your prescriber or health care professional for regular checks on your progress and for the needed blood tests and blood pressure measurements. It is especially important for the doctor to make sure your hemoglobin level is in the desired range, to limit the risk of potential side effects and to give you the best benefit. Keep all appointments for any recommended tests. Check your blood pressure as directed. Ask your doctor what your blood pressure should be and when you should contact him or her. As your body makes more red blood cells, you may need to take iron, folic acid, or vitamin B supplements. Ask your doctor or health care provider which products are right for you. If you have kidney disease continue dietary restrictions, even though this medication can make you feel better. Talk with your doctor or health care professional about the foods you eat and the vitamins that you take. What side effects may I notice from receiving this medicine? Side effects that you should report to your doctor or health care professional as soon as possible: -allergic reactions like skin rash, itching or hives, swelling of the face, lips, or tongue -breathing problems -changes in vision -chest pain -confusion, trouble speaking or understanding -feeling faint or lightheaded, falls -high blood pressure -muscle aches or pains -pain, swelling, warmth in the leg -rapid weight gain -severe headaches -sudden numbness or weakness of the face, arm or leg -trouble walking, dizziness, loss of balance or coordination -seizures (convulsions) -swelling of the ankles, feet, hands -unusually weak or tired Side effects that usually do not require medical attention (report to your doctor or health care professional if they continue or are bothersome): -diarrhea -fever, chills (flu-like  symptoms) -headaches -nausea, vomiting -redness, stinging, or swelling at site where injected This list may not describe all possible side effects. Call your doctor for medical advice about side effects. You may report side effects to FDA at 1-800-FDA-1088. Where should I keep my medicine? Keep out of the reach of children. Store in a refrigerator between 2 and 8 degrees C (36 and 46 degrees F). Do not freeze or shake. Throw away any unused portion if using a single-dose vial. Multi-dose vials can be kept in the refrigerator for up  to 21 days after the initial dose. Throw away unused medicine. NOTE: This sheet is a summary. It may not cover all possible information. If you have questions about this medicine, talk to your doctor, pharmacist, or health care provider.  2015, Else[employer] Standard. (2008-10-14 10:25:44)   Pegfilgrastim injection What is this medicine? PEGFILGRASTIM (peg fil GRA stim) is a long-acting granulocyte colony-stimulating factor that stimulates the growth of neutrophils, a type of white blood cell important in the body's fight against infection. It is used to reduce the incidence of fever and infection in patients with certain types of cancer who are receiving chemotherapy that affects the bone marrow. This medicine may be used for other purposes; ask your health care provider or pharmacist if you have questions. COMMON BRAND NAME(S): Neulasta What should I tell my health care provider before I take this medicine? They need to know if you have any of these conditions: -latex allergy -ongoing radiation therapy -sickle cell disease -skin reactions to acrylic adhesives (On-Body Injector only) -an unusual or allergic reaction to pegfilgrastim, filgrastim, other medicines, foods, dyes, or preservatives -pregnant or trying to get pregnant -breast-feeding How should I use this medicine? This medicine is for injection under the skin. If you get this medicine at home, you  will be taught how to prepare and give the pre-filled syringe or how to use the On-body Injector. Refer to the patient Instructions for Use for detailed instructions. Use exactly as directed. Take your medicine at regular intervals. Do not take your medicine more often than directed. It is important that you put your used needles and syringes in a special sharps container. Do not put them in a trash can. If you do not have a sharps container, call your pharmacist or healthcare provider to get one. Talk to your pediatrician regarding the use of this medicine in children. Special care may be needed. Overdosage: If you think you have taken too much of this medicine contact a poison control center or emergency room at once. NOTE: This medicine is only for you. Do not share this medicine with others. What if I miss a dose? It is important not to miss your dose. Call your doctor or health care professional if you miss your dose. If you miss a dose due to an On-body Injector failure or leakage, a new dose should be administered as soon as possible using a single prefilled syringe for manual use. What may interact with this medicine? Interactions have not been studied. Give your health care provider a list of all the medicines, herbs, non-prescription drugs, or dietary supplements you use. Also tell them if you smoke, drink alcohol, or use illegal drugs. Some items may interact with your medicine. This list may not describe all possible interactions. Give your health care provider a list of all the medicines, herbs, non-prescription drugs, or dietary supplements you use. Also tell them if you smoke, drink alcohol, or use illegal drugs. Some items may interact with your medicine. What should I watch for while using this medicine? You may need blood work done while you are taking this medicine. If you are going to need a MRI, CT scan, or other procedure, tell your doctor that you are using this medicine (On-Body  Injector only). What side effects may I notice from receiving this medicine? Side effects that you should report to your doctor or health care professional as soon as possible: -allergic reactions like skin rash, itching or hives, swelling of the face, lips, or tongue -  dizziness -fever -pain, redness, or irritation at site where injected -pinpoint red spots on the skin -shortness of breath or breathing problems -stomach or side pain, or pain at the shoulder -swelling -tiredness -trouble passing urine Side effects that usually do not require medical attention (report to your doctor or health care professional if they continue or are bothersome): -bone pain -muscle pain This list may not describe all possible side effects. Call your doctor for medical advice about side effects. You may report side effects to FDA at 1-800-FDA-1088. Where should I keep my medicine? Keep out of the reach of children. Store pre-filled syringes in a refrigerator between 2 and 8 degrees C (36 and 46 degrees F). Do not freeze. Keep in carton to protect from light. Throw away this medicine if it is left out of the refrigerator for more than 48 hours. Throw away any unused medicine after the expiration date. NOTE: This sheet is a summary. It may not cover all possible information. If you have questions about this medicine, talk to your doctor, pharmacist, or health care provider.  2015, [employer] Standard. (2014-01-30 16:14:05)  Leuprolide injection What is this medicine? LEUPROLIDE (loo PROE lide) is a man-made hormone. It is used to treat the symptoms of prostate cancer. This medicine may also be used to treat children with early onset of puberty. It may be used for other hormonal conditions. This medicine may be used for other purposes; ask your health care provider or pharmacist if you have questions. COMMON BRAND NAME(S): Lupron What should I tell my health care provider before I take this medicine? They  need to know if you have any of these conditions: -diabetes -heart disease or previous heart attack -high blood pressure -high cholesterol -pain or difficulty passing urine -spinal cord metastasis -stroke -tobacco smoker -an unusual or allergic reaction to leuprolide, benzyl alcohol, other medicines, foods, dyes, or preservatives -pregnant or trying to get pregnant -breast-feeding How should I use this medicine? This medicine is for injection under the skin or into a muscle. You will be taught how to prepare and give this medicine. Use exactly as directed. Take your medicine at regular intervals. Do not take your medicine more often than directed. It is important that you put your used needles and syringes in a special sharps container. Do not put them in a trash can. If you do not have a sharps container, call your pharmacist or healthcare provider to get one. Talk to your pediatrician regarding the use of this medicine in children. While this medicine may be prescribed for children as young as 8 years for selected conditions, precautions do apply. Overdosage: If you think you have taken too much of this medicine contact a poison control center or emergency room at once. NOTE: This medicine is only for you. Do not share this medicine with others. What if I miss a dose? If you miss a dose, take it as soon as you can. If it is almost time for your next dose, take only that dose. Do not take double or extra doses. What may interact with this medicine? Do not take this medicine with any of the following medications: -chasteberry This medicine may also interact with the following medications: -herbal or dietary supplements, like black cohosh or DHEA -female hormones, like estrogens or progestins and birth control pills, patches, rings, or injections -female hormones, like testosterone This list may not describe all possible interactions. Give your health care provider a list of all the  medicines, herbs,  non-prescription drugs, or dietary supplements you use. Also tell them if you smoke, drink alcohol, or use illegal drugs. Some items may interact with your medicine. What should I watch for while using this medicine? Visit your doctor or health care professional for regular checks on your progress. During the first week, your symptoms may get worse, but then will improve as you continue your treatment. You may get hot flashes, increased bone pain, increased difficulty passing urine, or an aggravation of nerve symptoms. Discuss these effects with your doctor or health care professional, some of them may improve with continued use of this medicine. Female patients may experience a menstrual cycle or spotting during the first 2 months of therapy with this medicine. If this continues, contact your doctor or health care professional. What side effects may I notice from receiving this medicine? Side effects that you should report to your doctor or health care professional as soon as possible: -allergic reactions like skin rash, itching or hives, swelling of the face, lips, or tongue -breathing problems -chest pain -depression or memory disorders -pain in your legs or groin -pain at site where injected -severe headache -swelling of the feet and legs -visual changes -vomiting Side effects that usually do not require medical attention (report to your doctor or health care professional if they continue or are bothersome): -breast swelling or tenderness -decrease in sex drive or performance -diarrhea -hot flashes -loss of appetite -muscle, joint, or bone pains -nausea -redness or irritation at site where injected -skin problems or acne This list may not describe all possible side effects. Call your doctor for medical advice about side effects. You may report side effects to FDA at 1-800-FDA-1088. Where should I keep my medicine? Keep out of the reach of children. Store below 25  degrees C (77 degrees F). Do not freeze. Protect from light. Do not use if it is not clear or if there are particles present. Throw away any unused medicine after the expiration date. NOTE: This sheet is a summary. It may not cover all possible information. If you have questions about this medicine, talk to your doctor, pharmacist, or health care provider.  2015, [employer]vier/[employer] Standard. (2009-03-17 13:26:20)

## 2014-10-08 ENCOUNTER — Ambulatory Visit (HOSPITAL_BASED_OUTPATIENT_CLINIC_OR_DEPARTMENT_OTHER): Payer: Medicaid Other

## 2014-10-08 VITALS — BP 144/68 | HR 91 | Temp 98.4°F

## 2014-10-08 DIAGNOSIS — C8330 Diffuse large B-cell lymphoma, unspecified site: Secondary | ICD-10-CM

## 2014-10-08 DIAGNOSIS — Z452 Encounter for adjustment and management of vascular access device: Secondary | ICD-10-CM

## 2014-10-08 MED ORDER — HEPARIN SOD (PORK) LOCK FLUSH 100 UNIT/ML IV SOLN
500.0000 [IU] | Freq: Once | INTRAVENOUS | Status: AC
  Administered 2014-10-08: 500 [IU] via INTRAVENOUS
  Filled 2014-10-08: qty 5

## 2014-10-08 MED ORDER — SODIUM CHLORIDE 0.9 % IJ SOLN
10.0000 mL | INTRAMUSCULAR | Status: DC | PRN
  Administered 2014-10-08: 10 mL via INTRAVENOUS
  Filled 2014-10-08: qty 10

## 2014-10-08 NOTE — Patient Instructions (Signed)
PICC Home Guide A peripherally inserted central catheter (PICC) is a long, thin, flexible tube that is inserted into a vein in the upper arm. It is a form of intravenous (IV) access. It is considered to be a "central" line because the tip of the PICC ends in a large vein in your chest. This large vein is called the superior vena cava (SVC). The PICC tip ends in the SVC because there is a lot of blood flow in the SVC. This allows medicines and IV fluids to be quickly distributed throughout the body. The PICC is inserted using a sterile technique by a specially trained nurse or physician. After the PICC is inserted, a chest X-ray exam is done to be sure it is in the correct place.  A PICC may be placed for different reasons, such as:  To give medicines and liquid nutrition that can only be given through a central line. Examples are:  Certain antibiotic treatments.  Chemotherapy.  Total parenteral nutrition (TPN).  To take frequent blood samples.  To give IV fluids and blood products.  If there is difficulty placing a peripheral intravenous (PIV) catheter. If taken care of properly, a PICC can remain in place for several months. A PICC can also allow a person to go home from the hospital early. Medicine and PICC care can be managed at home by a family member or home health care team. WHAT PROBLEMS CAN HAPPEN WHEN I HAVE A PICC? Problems with a PICC can occasionally occur. These may include the following:  A blood clot (thrombus) forming in or at the tip of the PICC. This can cause the PICC to become clogged. A clot-dissolving medicine called tissue plasminogen activator (tPA) can be given through the PICC to help break up the clot.  Inflammation of the vein (phlebitis) in which the PICC is placed. Signs of inflammation may include redness, pain at the insertion site, red streaks, or being able to feel a "cord" in the vein where the PICC is located.  Infection in the PICC or at the insertion  site. Signs of infection may include fever, chills, redness, swelling, or pus drainage from the PICC insertion site.  PICC movement (malposition). The PICC tip may move from its original position due to excessive physical activity, forceful coughing, sneezing, or vomiting.  A break or cut in the PICC. It is important to not use scissors near the PICC.  Nerve or tendon irritation or injury during PICC insertion. WHAT SHOULD I KEEP IN MIND ABOUT ACTIVITIES WHEN I HAVE A PICC?  You may bend your arm and move it freely. If your PICC is near or at the bend of your elbow, avoid activity with repeated motion at the elbow.  Rest at home for the remainder of the day following PICC line insertion.  Avoid lifting heavy objects as instructed by your health care provider.  Avoid using a crutch with the arm on the same side as your PICC. You may need to use a walker. WHAT SHOULD I KNOW ABOUT MY PICC DRESSING?  Keep your PICC bandage (dressing) clean and dry to prevent infection.  Ask your health care provider when you may shower. Ask your health care provider to teach you how to wrap the PICC when you do take a shower.  Change the PICC dressing as instructed by your health care provider.  Change your PICC dressing if it becomes loose or wet. WHAT SHOULD I KNOW ABOUT PICC CARE?  Check the PICC insertion site   daily for leakage, redness, swelling, or pain.  Do not take a bath, swim, or use hot tubs when you have a PICC. Cover PICC line with clear plastic wrap and tape to keep it dry while showering.  Flush the PICC as directed by your health care provider. Let your health care provider know right away if the PICC is difficult to flush or does not flush. Do not use force to flush the PICC.  Do not use a syringe that is less than 10 mL to flush the PICC.  Never pull or tug on the PICC.  Avoid blood pressure checks on the arm with the PICC.  Keep your PICC identification card with you at all  times.  Do not take the PICC out yourself. Only a trained clinical professional should remove the PICC. SEEK IMMEDIATE MEDICAL CARE IF:  Your PICC is accidentally pulled all the way out. If this happens, cover the insertion site with a bandage or gauze dressing. Do not throw the PICC away. Your health care provider will need to inspect it.  Your PICC was tugged or pulled and has partially come out. Do not  push the PICC back in.  There is any type of drainage, redness, or swelling where the PICC enters the skin.  You cannot flush the PICC, it is difficult to flush, or the PICC leaks around the insertion site when it is flushed.  You hear a "flushing" sound when the PICC is flushed.  You have pain, discomfort, or numbness in your arm, shoulder, or jaw on the same side as the PICC.  You feel your heart "racing" or skipping beats.  You notice a hole or tear in the PICC.  You develop chills or a fever. MAKE SURE YOU:   Understand these instructions.  Will watch your condition.  Will get help right away if you are not doing well or get worse. Document Released: 05/07/2003 Document Revised: 03/17/2014 Document Reviewed: 07/08/2013 Exit[employer] Patient Information 2015 [employer]. This information is not intended to replace advice given to you by your health care provider. Make sure you discuss any questions you have with your health care provider.  

## 2014-10-10 ENCOUNTER — Other Ambulatory Visit: Payer: Self-pay | Admitting: *Deleted

## 2014-10-10 ENCOUNTER — Ambulatory Visit (HOSPITAL_BASED_OUTPATIENT_CLINIC_OR_DEPARTMENT_OTHER): Payer: Medicaid Other

## 2014-10-10 VITALS — BP 135/67 | HR 89 | Temp 98.2°F

## 2014-10-10 DIAGNOSIS — C8330 Diffuse large B-cell lymphoma, unspecified site: Secondary | ICD-10-CM

## 2014-10-10 DIAGNOSIS — Z452 Encounter for adjustment and management of vascular access device: Secondary | ICD-10-CM

## 2014-10-10 MED ORDER — HEPARIN SOD (PORK) LOCK FLUSH 100 UNIT/ML IV SOLN
500.0000 [IU] | Freq: Once | INTRAVENOUS | Status: AC
  Administered 2014-10-10: 250 [IU] via INTRAVENOUS
  Filled 2014-10-10: qty 5

## 2014-10-10 MED ORDER — SODIUM CHLORIDE 0.9 % IJ SOLN
10.0000 mL | INTRAMUSCULAR | Status: DC | PRN
  Administered 2014-10-10: 10 mL via INTRAVENOUS
  Filled 2014-10-10: qty 10

## 2014-10-10 NOTE — Patient Instructions (Signed)
PICC Home Guide A peripherally inserted central catheter (PICC) is a long, thin, flexible tube that is inserted into a vein in the upper arm. It is a form of intravenous (IV) access. It is considered to be a "central" line because the tip of the PICC ends in a large vein in your chest. This large vein is called the superior vena cava (SVC). The PICC tip ends in the SVC because there is a lot of blood flow in the SVC. This allows medicines and IV fluids to be quickly distributed throughout the body. The PICC is inserted using a sterile technique by a specially trained nurse or physician. After the PICC is inserted, a chest X-ray exam is done to be sure it is in the correct place.  A PICC may be placed for different reasons, such as:  To give medicines and liquid nutrition that can only be given through a central line. Examples are:  Certain antibiotic treatments.  Chemotherapy.  Total parenteral nutrition (TPN).  To take frequent blood samples.  To give IV fluids and blood products.  If there is difficulty placing a peripheral intravenous (PIV) catheter. If taken care of properly, a PICC can remain in place for several months. A PICC can also allow a person to go home from the hospital early. Medicine and PICC care can be managed at home by a family member or home health care team. WHAT PROBLEMS CAN HAPPEN WHEN I HAVE A PICC? Problems with a PICC can occasionally occur. These may include the following:  A blood clot (thrombus) forming in or at the tip of the PICC. This can cause the PICC to become clogged. A clot-dissolving medicine called tissue plasminogen activator (tPA) can be given through the PICC to help break up the clot.  Inflammation of the vein (phlebitis) in which the PICC is placed. Signs of inflammation may include redness, pain at the insertion site, red streaks, or being able to feel a "cord" in the vein where the PICC is located.  Infection in the PICC or at the insertion  site. Signs of infection may include fever, chills, redness, swelling, or pus drainage from the PICC insertion site.  PICC movement (malposition). The PICC tip may move from its original position due to excessive physical activity, forceful coughing, sneezing, or vomiting.  A break or cut in the PICC. It is important to not use scissors near the PICC.  Nerve or tendon irritation or injury during PICC insertion. WHAT SHOULD I KEEP IN MIND ABOUT ACTIVITIES WHEN I HAVE A PICC?  You may bend your arm and move it freely. If your PICC is near or at the bend of your elbow, avoid activity with repeated motion at the elbow.  Rest at home for the remainder of the day following PICC line insertion.  Avoid lifting heavy objects as instructed by your health care provider.  Avoid using a crutch with the arm on the same side as your PICC. You may need to use a walker. WHAT SHOULD I KNOW ABOUT MY PICC DRESSING?  Keep your PICC bandage (dressing) clean and dry to prevent infection.  Ask your health care provider when you may shower. Ask your health care provider to teach you how to wrap the PICC when you do take a shower.  Change the PICC dressing as instructed by your health care provider.  Change your PICC dressing if it becomes loose or wet. WHAT SHOULD I KNOW ABOUT PICC CARE?  Check the PICC insertion site   daily for leakage, redness, swelling, or pain.  Do not take a bath, swim, or use hot tubs when you have a PICC. Cover PICC line with clear plastic wrap and tape to keep it dry while showering.  Flush the PICC as directed by your health care provider. Let your health care provider know right away if the PICC is difficult to flush or does not flush. Do not use force to flush the PICC.  Do not use a syringe that is less than 10 mL to flush the PICC.  Never pull or tug on the PICC.  Avoid blood pressure checks on the arm with the PICC.  Keep your PICC identification card with you at all  times.  Do not take the PICC out yourself. Only a trained clinical professional should remove the PICC. SEEK IMMEDIATE MEDICAL CARE IF:  Your PICC is accidentally pulled all the way out. If this happens, cover the insertion site with a bandage or gauze dressing. Do not throw the PICC away. Your health care provider will need to inspect it.  Your PICC was tugged or pulled and has partially come out. Do not  push the PICC back in.  There is any type of drainage, redness, or swelling where the PICC enters the skin.  You cannot flush the PICC, it is difficult to flush, or the PICC leaks around the insertion site when it is flushed.  You hear a "flushing" sound when the PICC is flushed.  You have pain, discomfort, or numbness in your arm, shoulder, or jaw on the same side as the PICC.  You feel your heart "racing" or skipping beats.  You notice a hole or tear in the PICC.  You develop chills or a fever. MAKE SURE YOU:   Understand these instructions.  Will watch your condition.  Will get help right away if you are not doing well or get worse. Document Released: 05/07/2003 Document Revised: 03/17/2014 Document Reviewed: 07/08/2013 Exit[employer] Patient Information 2015 [employer]. This information is not intended to replace advice given to you by your health care provider. Make sure you discuss any questions you have with your health care provider.  

## 2014-10-13 ENCOUNTER — Ambulatory Visit (HOSPITAL_BASED_OUTPATIENT_CLINIC_OR_DEPARTMENT_OTHER): Payer: Medicaid Other

## 2014-10-13 ENCOUNTER — Other Ambulatory Visit: Payer: Self-pay

## 2014-10-13 VITALS — BP 118/57 | HR 88 | Temp 98.1°F

## 2014-10-13 DIAGNOSIS — Z452 Encounter for adjustment and management of vascular access device: Secondary | ICD-10-CM

## 2014-10-13 DIAGNOSIS — Z789 Other specified health status: Secondary | ICD-10-CM

## 2014-10-13 DIAGNOSIS — D630 Anemia in neoplastic disease: Secondary | ICD-10-CM

## 2014-10-13 DIAGNOSIS — C8330 Diffuse large B-cell lymphoma, unspecified site: Secondary | ICD-10-CM

## 2014-10-13 DIAGNOSIS — IMO0001 Reserved for inherently not codable concepts without codable children: Secondary | ICD-10-CM

## 2014-10-13 MED ORDER — SODIUM CHLORIDE 0.9 % IJ SOLN
10.0000 mL | INTRAMUSCULAR | Status: DC | PRN
  Administered 2014-10-13: 10 mL via INTRAVENOUS
  Filled 2014-10-13: qty 10

## 2014-10-13 MED ORDER — HEPARIN SOD (PORK) LOCK FLUSH 100 UNIT/ML IV SOLN
500.0000 [IU] | Freq: Once | INTRAVENOUS | Status: AC
  Administered 2014-10-13: 250 [IU] via INTRAVENOUS
  Filled 2014-10-13: qty 5

## 2014-10-13 MED ORDER — EPOETIN ALFA 40000 UNIT/ML IJ SOLN
40000.0000 [IU] | INTRAMUSCULAR | Status: DC
  Administered 2014-10-13: 40000 [IU] via SUBCUTANEOUS
  Filled 2014-10-13: qty 1

## 2014-10-13 NOTE — Patient Instructions (Signed)
PICC Home Guide A peripherally inserted central catheter (PICC) is a long, thin, flexible tube that is inserted into a vein in the upper arm. It is a form of intravenous (IV) access. It is considered to be a "central" line because the tip of the PICC ends in a large vein in your chest. This large vein is called the superior vena cava (SVC). The PICC tip ends in the SVC because there is a lot of blood flow in the SVC. This allows medicines and IV fluids to be quickly distributed throughout the body. The PICC is inserted using a sterile technique by a specially trained nurse or physician. After the PICC is inserted, a chest X-ray exam is done to be sure it is in the correct place.  A PICC may be placed for different reasons, such as:  To give medicines and liquid nutrition that can only be given through a central line. Examples are:  Certain antibiotic treatments.  Chemotherapy.  Total parenteral nutrition (TPN).  To take frequent blood samples.  To give IV fluids and blood products.  If there is difficulty placing a peripheral intravenous (PIV) catheter. If taken care of properly, a PICC can remain in place for several months. A PICC can also allow a person to go home from the hospital early. Medicine and PICC care can be managed at home by a family member or home health care team. WHAT PROBLEMS CAN HAPPEN WHEN I HAVE A PICC? Problems with a PICC can occasionally occur. These may include the following:  A blood clot (thrombus) forming in or at the tip of the PICC. This can cause the PICC to become clogged. A clot-dissolving medicine called tissue plasminogen activator (tPA) can be given through the PICC to help break up the clot.  Inflammation of the vein (phlebitis) in which the PICC is placed. Signs of inflammation may include redness, pain at the insertion site, red streaks, or being able to feel a "cord" in the vein where the PICC is located.  Infection in the PICC or at the insertion  site. Signs of infection may include fever, chills, redness, swelling, or pus drainage from the PICC insertion site.  PICC movement (malposition). The PICC tip may move from its original position due to excessive physical activity, forceful coughing, sneezing, or vomiting.  A break or cut in the PICC. It is important to not use scissors near the PICC.  Nerve or tendon irritation or injury during PICC insertion. WHAT SHOULD I KEEP IN MIND ABOUT ACTIVITIES WHEN I HAVE A PICC?  You may bend your arm and move it freely. If your PICC is near or at the bend of your elbow, avoid activity with repeated motion at the elbow.  Rest at home for the remainder of the day following PICC line insertion.  Avoid lifting heavy objects as instructed by your health care provider.  Avoid using a crutch with the arm on the same side as your PICC. You may need to use a walker. WHAT SHOULD I KNOW ABOUT MY PICC DRESSING?  Keep your PICC bandage (dressing) clean and dry to prevent infection.  Ask your health care provider when you may shower. Ask your health care provider to teach you how to wrap the PICC when you do take a shower.  Change the PICC dressing as instructed by your health care provider.  Change your PICC dressing if it becomes loose or wet. WHAT SHOULD I KNOW ABOUT PICC CARE?  Check the PICC insertion site   daily for leakage, redness, swelling, or pain.  Do not take a bath, swim, or use hot tubs when you have a PICC. Cover PICC line with clear plastic wrap and tape to keep it dry while showering.  Flush the PICC as directed by your health care provider. Let your health care provider know right away if the PICC is difficult to flush or does not flush. Do not use force to flush the PICC.  Do not use a syringe that is less than 10 mL to flush the PICC.  Never pull or tug on the PICC.  Avoid blood pressure checks on the arm with the PICC.  Keep your PICC identification card with you at all  times.  Do not take the PICC out yourself. Only a trained clinical professional should remove the PICC. SEEK IMMEDIATE MEDICAL CARE IF:  Your PICC is accidentally pulled all the way out. If this happens, cover the insertion site with a bandage or gauze dressing. Do not throw the PICC away. Your health care provider will need to inspect it.  Your PICC was tugged or pulled and has partially come out. Do not  push the PICC back in.  There is any type of drainage, redness, or swelling where the PICC enters the skin.  You cannot flush the PICC, it is difficult to flush, or the PICC leaks around the insertion site when it is flushed.  You hear a "flushing" sound when the PICC is flushed.  You have pain, discomfort, or numbness in your arm, shoulder, or jaw on the same side as the PICC.  You feel your heart "racing" or skipping beats.  You notice a hole or tear in the PICC.  You develop chills or a fever. MAKE SURE YOU:   Understand these instructions.  Will watch your condition.  Will get help right away if you are not doing well or get worse. Document Released: 05/07/2003 Document Revised: 03/17/2014 Document Reviewed: 07/08/2013 Exit[employer] Patient Information 2015 [employer]. This information is not intended to replace advice given to you by your health care provider. Make sure you discuss any questions you have with your health care provider.  

## 2014-10-14 ENCOUNTER — Ambulatory Visit (HOSPITAL_COMMUNITY)
Admission: RE | Admit: 2014-10-14 | Discharge: 2014-10-14 | Disposition: A | Discharge status: Home / Self Care | Payer: Medicaid Other | Source: Ambulatory Visit | Attending: Hematology and Oncology | Admitting: Hematology and Oncology

## 2014-10-14 DIAGNOSIS — C8330 Diffuse large B-cell lymphoma, unspecified site: Secondary | ICD-10-CM | POA: Insufficient documentation

## 2014-10-14 LAB — GLUCOSE, CAPILLARY: Glucose-Capillary: 89 mg/dL (ref 70–99)

## 2014-10-14 MED ORDER — FLUDEOXYGLUCOSE F - 18 (FDG) INJECTION
10.3000 | Freq: Once | INTRAVENOUS | Status: AC | PRN
Start: 2014-10-14 — End: 2014-10-14
  Administered 2014-10-14: 10.3 via INTRAVENOUS

## 2014-10-15 ENCOUNTER — Ambulatory Visit (HOSPITAL_BASED_OUTPATIENT_CLINIC_OR_DEPARTMENT_OTHER): Payer: Medicaid Other

## 2014-10-15 DIAGNOSIS — C8330 Diffuse large B-cell lymphoma, unspecified site: Secondary | ICD-10-CM

## 2014-10-15 DIAGNOSIS — Z452 Encounter for adjustment and management of vascular access device: Secondary | ICD-10-CM

## 2014-10-15 MED ORDER — SODIUM CHLORIDE 0.9 % IJ SOLN
10.0000 mL | INTRAMUSCULAR | Status: DC | PRN
  Administered 2014-10-15: 10 mL via INTRAVENOUS
  Filled 2014-10-15: qty 10

## 2014-10-15 MED ORDER — HEPARIN SOD (PORK) LOCK FLUSH 100 UNIT/ML IV SOLN
500.0000 [IU] | Freq: Once | INTRAVENOUS | Status: AC
  Administered 2014-10-15: 250 [IU] via INTRAVENOUS
  Filled 2014-10-15: qty 5

## 2014-10-15 NOTE — Patient Instructions (Signed)
PICC Home Guide A peripherally inserted central catheter (PICC) is a long, thin, flexible tube that is inserted into a vein in the upper arm. It is a form of intravenous (IV) access. It is considered to be a "central" line because the tip of the PICC ends in a large vein in your chest. This large vein is called the superior vena cava (SVC). The PICC tip ends in the SVC because there is a lot of blood flow in the SVC. This allows medicines and IV fluids to be quickly distributed throughout the body. The PICC is inserted using a sterile technique by a specially trained nurse or physician. After the PICC is inserted, a chest X-ray exam is done to be sure it is in the correct place.  A PICC may be placed for different reasons, such as:  To give medicines and liquid nutrition that can only be given through a central line. Examples are:  Certain antibiotic treatments.  Chemotherapy.  Total parenteral nutrition (TPN).  To take frequent blood samples.  To give IV fluids and blood products.  If there is difficulty placing a peripheral intravenous (PIV) catheter. If taken care of properly, a PICC can remain in place for several months. A PICC can also allow a person to go home from the hospital early. Medicine and PICC care can be managed at home by a family member or home health care team. WHAT PROBLEMS CAN HAPPEN WHEN I HAVE A PICC? Problems with a PICC can occasionally occur. These may include the following:  A blood clot (thrombus) forming in or at the tip of the PICC. This can cause the PICC to become clogged. A clot-dissolving medicine called tissue plasminogen activator (tPA) can be given through the PICC to help break up the clot.  Inflammation of the vein (phlebitis) in which the PICC is placed. Signs of inflammation may include redness, pain at the insertion site, red streaks, or being able to feel a "cord" in the vein where the PICC is located.  Infection in the PICC or at the insertion  site. Signs of infection may include fever, chills, redness, swelling, or pus drainage from the PICC insertion site.  PICC movement (malposition). The PICC tip may move from its original position due to excessive physical activity, forceful coughing, sneezing, or vomiting.  A break or cut in the PICC. It is important to not use scissors near the PICC.  Nerve or tendon irritation or injury during PICC insertion. WHAT SHOULD I KEEP IN MIND ABOUT ACTIVITIES WHEN I HAVE A PICC?  You may bend your arm and move it freely. If your PICC is near or at the bend of your elbow, avoid activity with repeated motion at the elbow.  Rest at home for the remainder of the day following PICC line insertion.  Avoid lifting heavy objects as instructed by your health care provider.  Avoid using a crutch with the arm on the same side as your PICC. You may need to use a walker. WHAT SHOULD I KNOW ABOUT MY PICC DRESSING?  Keep your PICC bandage (dressing) clean and dry to prevent infection.  Ask your health care provider when you may shower. Ask your health care provider to teach you how to wrap the PICC when you do take a shower.  Change the PICC dressing as instructed by your health care provider.  Change your PICC dressing if it becomes loose or wet. WHAT SHOULD I KNOW ABOUT PICC CARE?  Check the PICC insertion site   daily for leakage, redness, swelling, or pain.  Do not take a bath, swim, or use hot tubs when you have a PICC. Cover PICC line with clear plastic wrap and tape to keep it dry while showering.  Flush the PICC as directed by your health care provider. Let your health care provider know right away if the PICC is difficult to flush or does not flush. Do not use force to flush the PICC.  Do not use a syringe that is less than 10 mL to flush the PICC.  Never pull or tug on the PICC.  Avoid blood pressure checks on the arm with the PICC.  Keep your PICC identification card with you at all  times.  Do not take the PICC out yourself. Only a trained clinical professional should remove the PICC. SEEK IMMEDIATE MEDICAL CARE IF:  Your PICC is accidentally pulled all the way out. If this happens, cover the insertion site with a bandage or gauze dressing. Do not throw the PICC away. Your health care provider will need to inspect it.  Your PICC was tugged or pulled and has partially come out. Do not  push the PICC back in.  There is any type of drainage, redness, or swelling where the PICC enters the skin.  You cannot flush the PICC, it is difficult to flush, or the PICC leaks around the insertion site when it is flushed.  You hear a "flushing" sound when the PICC is flushed.  You have pain, discomfort, or numbness in your arm, shoulder, or jaw on the same side as the PICC.  You feel your heart "racing" or skipping beats.  You notice a hole or tear in the PICC.  You develop chills or a fever. MAKE SURE YOU:   Understand these instructions.  Will watch your condition.  Will get help right away if you are not doing well or get worse. Document Released: 05/07/2003 Document Revised: 03/17/2014 Document Reviewed: 07/08/2013 Exit[employer] Patient Information 2015 [employer]. This information is not intended to replace advice given to you by your health care provider. Make sure you discuss any questions you have with your health care provider.  

## 2014-10-17 ENCOUNTER — Encounter: Payer: Self-pay | Admitting: *Deleted

## 2014-10-17 ENCOUNTER — Encounter (HOSPITAL_COMMUNITY): Payer: Self-pay

## 2014-10-17 ENCOUNTER — Ambulatory Visit (HOSPITAL_BASED_OUTPATIENT_CLINIC_OR_DEPARTMENT_OTHER): Payer: Medicaid Other | Admitting: Hematology and Oncology

## 2014-10-17 ENCOUNTER — Other Ambulatory Visit (HOSPITAL_BASED_OUTPATIENT_CLINIC_OR_DEPARTMENT_OTHER): Payer: Medicaid Other

## 2014-10-17 ENCOUNTER — Ambulatory Visit (HOSPITAL_BASED_OUTPATIENT_CLINIC_OR_DEPARTMENT_OTHER): Payer: Medicaid Other

## 2014-10-17 ENCOUNTER — Encounter: Payer: Self-pay | Admitting: Hematology and Oncology

## 2014-10-17 VITALS — BP 119/70 | HR 74 | Temp 99.1°F | Resp 18 | Ht 66.0 in | Wt 212.6 lb

## 2014-10-17 DIAGNOSIS — IMO0001 Reserved for inherently not codable concepts without codable children: Secondary | ICD-10-CM

## 2014-10-17 DIAGNOSIS — Z452 Encounter for adjustment and management of vascular access device: Secondary | ICD-10-CM

## 2014-10-17 DIAGNOSIS — C8330 Diffuse large B-cell lymphoma, unspecified site: Secondary | ICD-10-CM

## 2014-10-17 DIAGNOSIS — Z789 Other specified health status: Secondary | ICD-10-CM

## 2014-10-17 LAB — CBC WITH DIFFERENTIAL/PLATELET
BASO%: 0.4 % (ref 0.0–2.0)
Basophils Absolute: 0.1 10*3/uL (ref 0.0–0.1)
EOS%: 0.6 % (ref 0.0–7.0)
Eosinophils Absolute: 0.1 10*3/uL (ref 0.0–0.5)
HEMATOCRIT: 37.2 % (ref 34.8–46.6)
HGB: 12 g/dL (ref 11.6–15.9)
LYMPH%: 9.1 % — AB (ref 14.0–49.7)
MCH: 27.1 pg (ref 25.1–34.0)
MCHC: 32.3 g/dL (ref 31.5–36.0)
MCV: 84.2 fL (ref 79.5–101.0)
MONO#: 0.5 10*3/uL (ref 0.1–0.9)
MONO%: 3.8 % (ref 0.0–14.0)
NEUT#: 12.2 10*3/uL — ABNORMAL HIGH (ref 1.5–6.5)
NEUT%: 86.1 % — AB (ref 38.4–76.8)
PLATELETS: 255 10*3/uL (ref 145–400)
RBC: 4.42 10*6/uL (ref 3.70–5.45)
RDW: 15.9 % — ABNORMAL HIGH (ref 11.2–14.5)
WBC: 14.1 10*3/uL — ABNORMAL HIGH (ref 3.9–10.3)
lymph#: 1.3 10*3/uL (ref 0.9–3.3)
nRBC: 1 % — ABNORMAL HIGH (ref 0–0)

## 2014-10-17 LAB — COMPREHENSIVE METABOLIC PANEL (CC13)
ALK PHOS: 121 U/L (ref 40–150)
ALT: 14 U/L (ref 0–55)
AST: 10 U/L (ref 5–34)
Albumin: 3.5 g/dL (ref 3.5–5.0)
Anion Gap: 10 mEq/L (ref 3–11)
BUN: 13 mg/dL (ref 7.0–26.0)
CO2: 24 mEq/L (ref 22–29)
CREATININE: 0.8 mg/dL (ref 0.6–1.1)
Calcium: 9.6 mg/dL (ref 8.4–10.4)
Chloride: 109 mEq/L (ref 98–109)
GLUCOSE: 86 mg/dL (ref 70–140)
Potassium: 4 mEq/L (ref 3.5–5.1)
Sodium: 143 mEq/L (ref 136–145)
Total Bilirubin: 0.2 mg/dL (ref 0.20–1.20)
Total Protein: 6.3 g/dL — ABNORMAL LOW (ref 6.4–8.3)

## 2014-10-17 LAB — LACTATE DEHYDROGENASE (CC13): LDH: 272 U/L — ABNORMAL HIGH (ref 125–245)

## 2014-10-17 MED ORDER — HEPARIN SOD (PORK) LOCK FLUSH 100 UNIT/ML IV SOLN
500.0000 [IU] | Freq: Once | INTRAVENOUS | Status: AC
  Administered 2014-10-17: 500 [IU] via INTRAVENOUS
  Filled 2014-10-17: qty 5

## 2014-10-17 MED ORDER — SODIUM CHLORIDE 0.9 % IJ SOLN
10.0000 mL | INTRAMUSCULAR | Status: DC | PRN
  Administered 2014-10-17: 10 mL via INTRAVENOUS
  Filled 2014-10-17: qty 10

## 2014-10-17 NOTE — Progress Notes (Signed)
Planned admit for Chemotherapy inpatient on 10/20/14.   Notified Robin in Patient Placement and left VM for Dannielle Huh in managed care dept..  Dr. Alvy Bimler notified [facility].

## 2014-10-17 NOTE — Patient Instructions (Signed)
PICC Home Guide A peripherally inserted central catheter (PICC) is a long, thin, flexible tube that is inserted into a vein in the upper arm. It is a form of intravenous (IV) access. It is considered to be a "central" line because the tip of the PICC ends in a large vein in your chest. This large vein is called the superior vena cava (SVC). The PICC tip ends in the SVC because there is a lot of blood flow in the SVC. This allows medicines and IV fluids to be quickly distributed throughout the body. The PICC is inserted using a sterile technique by a specially trained nurse or physician. After the PICC is inserted, a chest X-ray exam is done to be sure it is in the correct place.  A PICC may be placed for different reasons, such as:  To give medicines and liquid nutrition that can only be given through a central line. Examples are:  Certain antibiotic treatments.  Chemotherapy.  Total parenteral nutrition (TPN).  To take frequent blood samples.  To give IV fluids and blood products.  If there is difficulty placing a peripheral intravenous (PIV) catheter. If taken care of properly, a PICC can remain in place for several months. A PICC can also allow a person to go home from the hospital early. Medicine and PICC care can be managed at home by a family member or home health care team. WHAT PROBLEMS CAN HAPPEN WHEN I HAVE A PICC? Problems with a PICC can occasionally occur. These may include the following:  A blood clot (thrombus) forming in or at the tip of the PICC. This can cause the PICC to become clogged. A clot-dissolving medicine called tissue plasminogen activator (tPA) can be given through the PICC to help break up the clot.  Inflammation of the vein (phlebitis) in which the PICC is placed. Signs of inflammation may include redness, pain at the insertion site, red streaks, or being able to feel a "cord" in the vein where the PICC is located.  Infection in the PICC or at the insertion  site. Signs of infection may include fever, chills, redness, swelling, or pus drainage from the PICC insertion site.  PICC movement (malposition). The PICC tip may move from its original position due to excessive physical activity, forceful coughing, sneezing, or vomiting.  A break or cut in the PICC. It is important to not use scissors near the PICC.  Nerve or tendon irritation or injury during PICC insertion. WHAT SHOULD I KEEP IN MIND ABOUT ACTIVITIES WHEN I HAVE A PICC?  You may bend your arm and move it freely. If your PICC is near or at the bend of your elbow, avoid activity with repeated motion at the elbow.  Rest at home for the remainder of the day following PICC line insertion.  Avoid lifting heavy objects as instructed by your health care provider.  Avoid using a crutch with the arm on the same side as your PICC. You may need to use a walker. WHAT SHOULD I KNOW ABOUT MY PICC DRESSING?  Keep your PICC bandage (dressing) clean and dry to prevent infection.  Ask your health care provider when you may shower. Ask your health care provider to teach you how to wrap the PICC when you do take a shower.  Change the PICC dressing as instructed by your health care provider.  Change your PICC dressing if it becomes loose or wet. WHAT SHOULD I KNOW ABOUT PICC CARE?  Check the PICC insertion site   daily for leakage, redness, swelling, or pain.  Do not take a bath, swim, or use hot tubs when you have a PICC. Cover PICC line with clear plastic wrap and tape to keep it dry while showering.  Flush the PICC as directed by your health care provider. Let your health care provider know right away if the PICC is difficult to flush or does not flush. Do not use force to flush the PICC.  Do not use a syringe that is less than 10 mL to flush the PICC.  Never pull or tug on the PICC.  Avoid blood pressure checks on the arm with the PICC.  Keep your PICC identification card with you at all  times.  Do not take the PICC out yourself. Only a trained clinical professional should remove the PICC. SEEK IMMEDIATE MEDICAL CARE IF:  Your PICC is accidentally pulled all the way out. If this happens, cover the insertion site with a bandage or gauze dressing. Do not throw the PICC away. Your health care provider will need to inspect it.  Your PICC was tugged or pulled and has partially come out. Do not  push the PICC back in.  There is any type of drainage, redness, or swelling where the PICC enters the skin.  You cannot flush the PICC, it is difficult to flush, or the PICC leaks around the insertion site when it is flushed.  You hear a "flushing" sound when the PICC is flushed.  You have pain, discomfort, or numbness in your arm, shoulder, or jaw on the same side as the PICC.  You feel your heart "racing" or skipping beats.  You notice a hole or tear in the PICC.  You develop chills or a fever. MAKE SURE YOU:   Understand these instructions.  Will watch your condition.  Will get help right away if you are not doing well or get worse. Document Released: 05/07/2003 Document Revised: 03/17/2014 Document Reviewed: 07/08/2013 Exit[employer] Patient Information 2015 [employer]. This information is not intended to replace advice given to you by your health care provider. Make sure you discuss any questions you have with your health care provider.  

## 2014-10-18 LAB — PREGNANCY, URINE: Preg Test, Ur: NEGATIVE

## 2014-10-18 NOTE — Assessment & Plan Note (Signed)
She tolerated cycle 2 very well. We will proceed with cycle 3, conventional way without dose adjustment. She has extensive stage IV disease with high-intermediate prognostic score (IPI=3).  She will proceed with treatment along with intrathecal methotrexate. She will need monthly Lupron injection to prevent menstruation and preserve fertility. She will be admitted to the hospital on 10/20/2014. I recommend intrathecal methotrexate under IR guidance on 10/21/2014. After discharge, she will return to the [facility] on a weekly basis for supportive care. On 10/27/2014, she will receive Procrit injection along with Neulasta.

## 2014-10-18 NOTE — Assessment & Plan Note (Signed)
Due to her religious belief, she will not received blood products. She will receive Procrit injection weekly. So far, she has no signs of anemia.   

## 2014-10-18 NOTE — Progress Notes (Signed)
[place] OFFICE PROGRESS NOTE  Patient Care Team: No Pcp Per Patient as PCP - General (General Practice)  SUMMARY OF ONCOLOGIC HISTORY: Oncology History   Diffuse large B cell lymphoma   Primary site: Lymphoid Neoplasms   Staging method: AJCC 6th Edition   Clinical: Stage IV signed by Heath Lark, MD on 09/03/2014  8:18 PM   Summary: Stage IV IPI of 3: high LDH, >2 extranodal disease and stage IV at presentation       Diffuse large B cell lymphoma   07/30/2014 Imaging She had a bilateral mammogram which came back abnormal. Ultrasound-guided biopsy was performed, suspicious for lymphoma   08/25/2014 Imaging PET/CT scan showed diffuse metastatic cancer involving bilateral breasts, bone, and large abdominal mass   08/28/2014 Surgery She had excisional surgery and removal of right breast mass   08/28/2014 Pathology Results Accession: [id number] right breast mass is consistent with diffuse large B-cell lymphoma with high proliferation index.   09/04/2014 - 09/11/2014 Hospital Admission she is admitted to the hospital for cycle 1 of [place]   09/04/2014 Imaging echocardiogram is normal.   09/04/2014 Procedure she has placement of PICC line.   10/14/2014 Imaging PET/CT scan showed near complete response to treatment.    INTERVAL HISTORY: Please see below for problem oriented charting. She is seen prior to cycle 3 of therapy and to review test results.  REVIEW OF SYSTEMS:   Constitutional: Denies fevers, chills or abnormal weight loss Eyes: Denies blurriness of vision Ears, nose, mouth, throat, and face: Denies mucositis or sore throat Respiratory: Denies cough, dyspnea or wheezes Cardiovascular: Denies palpitation, chest discomfort or lower extremity swelling Gastrointestinal:  Denies nausea, heartburn or change in bowel habits Skin: Denies abnormal skin rashes Lymphatics: Denies new lymphadenopathy or easy bruising Neurological:Denies numbness, tingling or new  weaknesses Behavioral/Psych: Mood is stable, no new changes  All other systems were reviewed with the patient and are negative.  I have reviewed the past medical history, past surgical history, social history and family history with the patient and they are unchanged from previous note.  ALLERGIES:  is allergic to other and soap.  MEDICATIONS:  Current Outpatient Prescriptions  Medication Sig Dispense Refill  . allopurinol (ZYLOPRIM) 300 MG tablet Take 1 tablet (300 mg total) by mouth daily. 30 tablet 0  . HYDROcodone-acetaminophen (NORCO/VICODIN) 5-325 MG per tablet Take 1 tablet by mouth every 4 (four) hours as needed. 40 tablet 0  . ondansetron (ZOFRAN) 8 MG tablet Take 1 tablet (8 mg total) by mouth every 8 (eight) hours as needed for nausea. 30 tablet 3  . pantoprazole (PROTONIX) 40 MG tablet Take 1 tablet (40 mg total) by mouth daily at 12 noon. 30 tablet 0  . prochlorperazine (COMPAZINE) 10 MG tablet Take 1 tablet (10 mg total) by mouth every 6 (six) hours as needed for nausea. 30 tablet 3  . zolpidem (AMBIEN) 10 MG tablet Take 1 tablet (10 mg total) by mouth at bedtime as needed for sleep. 30 tablet 0   No current facility-administered medications for this visit.    PHYSICAL EXAMINATION: ECOG PERFORMANCE STATUS: 0 - Asymptomatic  Filed Vitals:   10/17/14 1159  BP: 119/70  Pulse: 74  Temp: 99.1 F (37.3 C)  Resp: 18   Filed Weights   10/17/14 1159  Weight: 212 lb 9.6 oz (96.435 kg)    GENERAL:alert, no distress and comfortable SKIN: skin color, texture, turgor are normal, no rashes or significant lesions EYES: normal, Conjunctiva are pink and  non-injected, sclera clear OROPHARYNX:no exudate, no erythema and lips, buccal mucosa, and tongue normal  NECK: supple, thyroid normal size, non-tender, without nodularity LYMPH:  no palpable lymphadenopathy in the cervical, axillary or inguinal LUNGS: clear to auscultation and percussion with normal breathing effort HEART:  regular rate & rhythm and no murmurs and no lower extremity edema ABDOMEN:abdomen soft, non-tender and normal bowel sounds Musculoskeletal:no cyanosis of digits and no clubbing  NEURO: alert & oriented x 3 with fluent speech, no focal motor/sensory deficits  LABORATORY DATA:  I have reviewed the data as listed    Component Value Date/Time   NA 143 10/17/2014 1416   NA 141 10/01/2014 0545   K 4.0 10/17/2014 1416   K 3.8 10/01/2014 0545   CL 106 10/01/2014 0545   CO2 24 10/17/2014 1416   CO2 25 10/01/2014 0545   GLUCOSE 86 10/17/2014 1416   GLUCOSE 162* 10/01/2014 0545   BUN 13.0 10/17/2014 1416   BUN 10 10/01/2014 0545   CREATININE 0.8 10/17/2014 1416   CREATININE 0.56 10/01/2014 0545   CALCIUM 9.6 10/17/2014 1416   CALCIUM 9.2 10/01/2014 0545   PROT 6.3* 10/17/2014 1416   PROT 6.4 10/01/2014 0545   ALBUMIN 3.5 10/17/2014 1416   ALBUMIN 3.2* 10/01/2014 0545   AST 10 10/17/2014 1416   AST 7 10/01/2014 0545   ALT 14 10/17/2014 1416   ALT 7 10/01/2014 0545   ALKPHOS 121 10/17/2014 1416   ALKPHOS 83 10/01/2014 0545   BILITOT 0.20 10/17/2014 1416   BILITOT <0.2* 10/01/2014 0545   GFRNONAA >90 10/01/2014 0545   GFRAA >90 10/01/2014 0545    No results found for: SPEP, UPEP  Lab Results  Component Value Date   WBC 14.1* 10/17/2014   NEUTROABS 12.2* 10/17/2014   HGB 12.0 10/17/2014   HCT 37.2 10/17/2014   MCV 84.2 10/17/2014   PLT 255 10/17/2014      Chemistry      Component Value Date/Time   NA 143 10/17/2014 1416   NA 141 10/01/2014 0545   K 4.0 10/17/2014 1416   K 3.8 10/01/2014 0545   CL 106 10/01/2014 0545   CO2 24 10/17/2014 1416   CO2 25 10/01/2014 0545   BUN 13.0 10/17/2014 1416   BUN 10 10/01/2014 0545   CREATININE 0.8 10/17/2014 1416   CREATININE 0.56 10/01/2014 0545      Component Value Date/Time   CALCIUM 9.6 10/17/2014 1416   CALCIUM 9.2 10/01/2014 0545   ALKPHOS 121 10/17/2014 1416   ALKPHOS 83 10/01/2014 0545   AST 10 10/17/2014 1416   AST  7 10/01/2014 0545   ALT 14 10/17/2014 1416   ALT 7 10/01/2014 0545   BILITOT 0.20 10/17/2014 1416   BILITOT <0.2* 10/01/2014 0545       RADIOGRAPHIC STUDIES: PET/CT scan were reviewed. I have personally reviewed the radiological images as listed and agreed with the findings in the report.  ASSESSMENT & PLAN:  Diffuse large B cell lymphoma She tolerated cycle 2 very well. We will proceed with cycle 3, conventional way without dose adjustment. She has extensive stage IV disease with high-intermediate prognostic score (IPI=3).  She will proceed with treatment along with intrathecal methotrexate. She will need monthly Lupron injection to prevent menstruation and preserve fertility. She will be admitted to the hospital on 10/20/2014. I recommend intrathecal methotrexate under IR guidance on 10/21/2014. After discharge, she will return to the [place] [place] on a weekly basis for supportive care. On 10/27/2014, she will receive  Procrit injection along with Neulasta.  Patient is Jehovah's Witness Due to her religious belief, she will not received blood products. She will receive Procrit injection weekly. So far, she has no signs of anemia.     Orders Placed This Encounter  Procedures  . CBC with Differential    Standing Status: Future     Number of Occurrences:      Standing Expiration Date: 11/21/2015  . Comprehensive metabolic panel    Standing Status: Future     Number of Occurrences:      Standing Expiration Date: 11/21/2015  . Lactate dehydrogenase    Standing Status: Future     Number of Occurrences:      Standing Expiration Date: 11/21/2015   All questions were answered. The patient knows to call the clinic with any problems, questions or concerns. No barriers to learning was detected. I spent 40 minutes counseling the patient face to face. The total time spent in the appointment was 60 minutes and more than 50% was on counseling and review of test results     [facility], [place],  MD 10/18/2014 8:50 PM

## 2014-10-20 ENCOUNTER — Encounter (HOSPITAL_COMMUNITY): Payer: Self-pay

## 2014-10-20 ENCOUNTER — Telehealth: Payer: Self-pay | Admitting: Hematology and Oncology

## 2014-10-20 ENCOUNTER — Inpatient Hospital Stay (HOSPITAL_COMMUNITY)
Admission: AD | Admit: 2014-10-20 | Discharge: 2014-10-24 | DRG: 847 | Disposition: A | Discharge status: Home / Self Care | Payer: Medicaid Other | Source: Ambulatory Visit | Attending: Hematology and Oncology | Admitting: Hematology and Oncology

## 2014-10-20 DIAGNOSIS — Z91048 Other nonmedicinal substance allergy status: Secondary | ICD-10-CM | POA: Diagnosis not present

## 2014-10-20 DIAGNOSIS — Z5111 Encounter for antineoplastic chemotherapy: Principal | ICD-10-CM

## 2014-10-20 DIAGNOSIS — G4700 Insomnia, unspecified: Secondary | ICD-10-CM | POA: Diagnosis present

## 2014-10-20 DIAGNOSIS — Z8572 Personal history of non-Hodgkin lymphomas: Secondary | ICD-10-CM | POA: Diagnosis present

## 2014-10-20 DIAGNOSIS — T458X5A Adverse effect of other primarily systemic and hematological agents, initial encounter: Secondary | ICD-10-CM | POA: Diagnosis present

## 2014-10-20 DIAGNOSIS — D72829 Elevated white blood cell count, unspecified: Secondary | ICD-10-CM | POA: Diagnosis present

## 2014-10-20 DIAGNOSIS — C8330 Diffuse large B-cell lymphoma, unspecified site: Secondary | ICD-10-CM | POA: Diagnosis present

## 2014-10-20 DIAGNOSIS — Z789 Other specified health status: Secondary | ICD-10-CM

## 2014-10-20 DIAGNOSIS — D72828 Other elevated white blood cell count: Secondary | ICD-10-CM

## 2014-10-20 DIAGNOSIS — T380X5A Adverse effect of glucocorticoids and synthetic analogues, initial encounter: Secondary | ICD-10-CM | POA: Diagnosis present

## 2014-10-20 DIAGNOSIS — C8590 Non-Hodgkin lymphoma, unspecified, unspecified site: Secondary | ICD-10-CM

## 2014-10-20 MED ORDER — EPINEPHRINE HCL 1 MG/ML IJ SOLN
0.5000 mg | Freq: Once | INTRAMUSCULAR | Status: AC | PRN
  Filled 2014-10-20: qty 1

## 2014-10-20 MED ORDER — HEPARIN SOD (PORK) LOCK FLUSH 100 UNIT/ML IV SOLN: 500.0000 [IU] | Freq: Once | INTRAVENOUS | Status: AC | PRN

## 2014-10-20 MED ORDER — EPINEPHRINE HCL 0.1 MG/ML IJ SOSY
0.2500 mg | PREFILLED_SYRINGE | Freq: Once | INTRAMUSCULAR | Status: AC | PRN
  Filled 2014-10-20: qty 10

## 2014-10-20 MED ORDER — ENOXAPARIN SODIUM 40 MG/0.4ML ~~LOC~~ SOLN
40.0000 mg | SUBCUTANEOUS | Status: DC
  Administered 2014-10-21 – 2014-10-23 (×2): 40 mg via SUBCUTANEOUS
  Filled 2014-10-20 (×4): qty 0.4

## 2014-10-20 MED ORDER — SODIUM CHLORIDE 0.9 % IV SOLN: INTRAVENOUS | Status: DC

## 2014-10-20 MED ORDER — SODIUM CHLORIDE 0.9 % IV SOLN
Freq: Once | INTRAVENOUS | Status: AC
  Administered 2014-10-24: 16 mg via INTRAVENOUS
  Filled 2014-10-20: qty 8

## 2014-10-20 MED ORDER — ACETAMINOPHEN 325 MG PO TABS
650.0000 mg | ORAL_TABLET | Freq: Once | ORAL | Status: AC
  Administered 2014-10-20: 650 mg via ORAL
  Filled 2014-10-20: qty 2

## 2014-10-20 MED ORDER — SODIUM CHLORIDE 0.9 % IV SOLN: Freq: Once | INTRAVENOUS | Status: AC | PRN

## 2014-10-20 MED ORDER — SODIUM CHLORIDE 0.9 % IJ SOLN
Freq: Once | INTRAMUSCULAR | Status: AC
  Administered 2014-10-21: 15:00:00 via INTRATHECAL
  Filled 2014-10-20: qty 0.48

## 2014-10-20 MED ORDER — SENNOSIDES-DOCUSATE SODIUM 8.6-50 MG PO TABS: 1.0000 | ORAL_TABLET | Freq: Two times a day (BID) | ORAL | Status: DC | PRN

## 2014-10-20 MED ORDER — DIPHENHYDRAMINE HCL 50 MG/ML IJ SOLN: 25.0000 mg | Freq: Once | INTRAMUSCULAR | Status: AC | PRN

## 2014-10-20 MED ORDER — SODIUM CHLORIDE 0.9 % IV SOLN
Freq: Once | INTRAVENOUS | Status: AC
  Administered 2014-10-20: 11:00:00 via INTRAVENOUS

## 2014-10-20 MED ORDER — ONDANSETRON HCL 4 MG/2ML IJ SOLN: 4.0000 mg | Freq: Three times a day (TID) | INTRAMUSCULAR | Status: DC | PRN

## 2014-10-20 MED ORDER — SODIUM CHLORIDE 0.9 % IV SOLN
375.0000 mg/m2 | Freq: Once | INTRAVENOUS | Status: AC
  Administered 2014-10-20: 800 mg via INTRAVENOUS
  Filled 2014-10-20: qty 80

## 2014-10-20 MED ORDER — SODIUM CHLORIDE 0.9 % IJ SOLN: 3.0000 mL | INTRAMUSCULAR | Status: DC | PRN

## 2014-10-20 MED ORDER — VINCRISTINE SULFATE CHEMO INJECTION 1 MG/ML
INTRAVENOUS | Status: AC
  Administered 2014-10-20 – 2014-10-23 (×4): via INTRAVENOUS
  Filled 2014-10-20 (×4): qty 10

## 2014-10-20 MED ORDER — DIPHENHYDRAMINE HCL 50 MG PO CAPS
50.0000 mg | ORAL_CAPSULE | Freq: Once | ORAL | Status: AC
  Administered 2014-10-20: 50 mg via ORAL
  Filled 2014-10-20: qty 1

## 2014-10-20 MED ORDER — ACETAMINOPHEN 325 MG PO TABS: 650.0000 mg | ORAL_TABLET | ORAL | Status: DC | PRN

## 2014-10-20 MED ORDER — ALBUTEROL SULFATE (2.5 MG/3ML) 0.083% IN NEBU: 2.5000 mg | INHALATION_SOLUTION | Freq: Once | RESPIRATORY_TRACT | Status: AC | PRN

## 2014-10-20 MED ORDER — ALTEPLASE 2 MG IJ SOLR
2.0000 mg | Freq: Once | INTRAMUSCULAR | Status: AC | PRN
  Filled 2014-10-20: qty 2

## 2014-10-20 MED ORDER — METHYLPREDNISOLONE SODIUM SUCC 125 MG IJ SOLR
125.0000 mg | Freq: Once | INTRAMUSCULAR | Status: AC | PRN
  Filled 2014-10-20: qty 2

## 2014-10-20 MED ORDER — SODIUM CHLORIDE 0.9 % IV SOLN
750.0000 mg/m2 | Freq: Once | INTRAVENOUS | Status: AC
  Administered 2014-10-24: 1520 mg via INTRAVENOUS
  Filled 2014-10-20: qty 76

## 2014-10-20 MED ORDER — SODIUM CHLORIDE 0.9 % IV SOLN
INTRAVENOUS | Status: AC
  Administered 2014-10-20: 8 mg via INTRAVENOUS
  Administered 2014-10-21 – 2014-10-23 (×3): 18 mg via INTRAVENOUS
  Filled 2014-10-20 (×4): qty 4

## 2014-10-20 MED ORDER — FAMOTIDINE IN NACL 20-0.9 MG/50ML-% IV SOLN
20.0000 mg | Freq: Once | INTRAVENOUS | Status: AC | PRN
  Filled 2014-10-20: qty 50

## 2014-10-20 MED ORDER — HEPARIN SOD (PORK) LOCK FLUSH 100 UNIT/ML IV SOLN: 250.0000 [IU] | Freq: Once | INTRAVENOUS | Status: AC | PRN

## 2014-10-20 MED ORDER — SODIUM CHLORIDE 0.9 % IV SOLN
INTRAVENOUS | Status: DC
  Administered 2014-10-20 – 2014-10-21 (×2): via INTRAVENOUS

## 2014-10-20 MED ORDER — ONDANSETRON 8 MG PO TBDP: 4.0000 mg | ORAL_TABLET | Freq: Three times a day (TID) | ORAL | Status: DC | PRN

## 2014-10-20 MED ORDER — HOT PACK MISC ONCOLOGY
1.0000 | Freq: Once | Status: AC | PRN
  Filled 2014-10-20: qty 1

## 2014-10-20 MED ORDER — COLD PACK MISC ONCOLOGY
1.0000 | Freq: Once | Status: AC | PRN
  Filled 2014-10-20: qty 1

## 2014-10-20 MED ORDER — ONDANSETRON HCL 4 MG PO TABS: 4.0000 mg | ORAL_TABLET | Freq: Three times a day (TID) | ORAL | Status: DC | PRN

## 2014-10-20 MED ORDER — SODIUM CHLORIDE 0.9 % IJ SOLN
10.0000 mL | INTRAMUSCULAR | Status: DC | PRN
Start: 2014-10-20 — End: 2014-10-24

## 2014-10-20 MED ORDER — SODIUM CHLORIDE 0.9 % IJ SOLN: 10.0000 mL | INTRAMUSCULAR | Status: DC | PRN

## 2014-10-20 MED ORDER — ONDANSETRON 8 MG/NS 50 ML IVPB
8.0000 mg | Freq: Three times a day (TID) | INTRAVENOUS | Status: DC | PRN
  Filled 2014-10-20: qty 8

## 2014-10-20 MED ORDER — ALUM & MAG HYDROXIDE-SIMETH 200-200-20 MG/5ML PO SUSP: 60.0000 mL | ORAL | Status: DC | PRN

## 2014-10-20 MED ORDER — DIPHENHYDRAMINE HCL 50 MG/ML IJ SOLN
50.0000 mg | Freq: Once | INTRAMUSCULAR | Status: AC | PRN
Start: 2014-10-20 — End: 2014-10-20

## 2014-10-20 NOTE — H&P (Signed)
[place]  [phone]    ADMISSION NOTE  Admitting MD:  Heath Lark, MD  Attending MD:  Heath Lark, MD  Reason for Admission: Cycle 3 of Chemotherapy with R-EPOCH for the treatment of Diffuse, large B Cell Lymphoma  I have seen the patient, examined her and edited the notes as follows  HPI: Lindsey Irwin is an 18 y.o. female with a history of Diffuse large B Cell Lymphoma, admitted to the hospital for the initiation of Cycle 3 of Chemotherapy with R-EPOCH. She feels well. Afebrile. Denies fever, chills or night sweats. Denies shortness of breath or chest pain. Denies nausea, vomiting, diarrhea or mucositis. Appetite is normal. Denies constipation,last bowel movement today. Denies any headaches, vision changes or dizziness. Denies any rashes. No bleeding issues. Ambulating frequently. No confusion reported.   Oncology History   Diffuse large B cell lymphoma  Primary site: Lymphoid Neoplasms  Staging method: AJCC 6th Edition  Clinical: Stage IV signed by Heath Lark, MD on 09/03/2014 8:18 PM  Summary: Stage IV IPI of 3: high LDH, >2 extranodal disease and stage IV at presentation       Diffuse large B cell lymphoma   07/30/2014 Imaging She had a bilateral mammogram which came back abnormal. Ultrasound-guided biopsy was performed, suspicious for lymphoma   08/25/2014 Imaging PET/CT scan showed diffuse metastatic cancer involving bilateral breasts, bone, and large abdominal mass   08/28/2014 Surgery She had excisional surgery and removal of right breast mass   08/28/2014 Pathology Results Accession: [id number] right breast mass is consistent with diffuse large B-cell lymphoma with high proliferation index.   09/04/2014 - 09/11/2014 Hospital Admission she is admitted to the hospital for cycle 1 of [place]   09/04/2014 Imaging echocardiogram is normal.   09/04/2014 Procedure she has placement of PICC line.   09/29/14  Hospital admission The patient was admitted to the hospital for cycle 2 of [place]   10/14/2014 Imaging PET-CT scan showed near-complete response to treatment   10/20/2014 Hospital admission The patient is admitted to the hospital for cycle 3 of  [place]     PMH:  Past Medical History  Diagnosis Date  . Breast mass 08/2014  . Sore throat 08/25/2014  . Cough 08/2014  . Nasal congestion 08/2014  . Diffuse large B cell lymphoma 09/02/2014  . Insomnia 09/24/2014    Surgeries:  Past Surgical History  Procedure Laterality Date  . No past surgeries    . Breast biopsy Right 08/28/2014    Procedure: RIGHT EXCISIONAL BREAST BIOPSY;  Surgeon: Donnie Mesa, MD;  Location: [facility];  Service: General;  Laterality: Right;    Allergies:  Allergies  Allergen Reactions  . Other     NO BLOOD PRODUCTS. PT IS A JEHOVAH WITNESS.  Marland Kitchen Soap Itching    GAIN LAUNDRY DETERGENT Allergic to tide and surf also    Medications:   Prior to Admission:  Prescriptions prior to admission  Medication Sig Dispense Refill Last Dose  . allopurinol (ZYLOPRIM) 300 MG tablet Take 1 tablet (300 mg total) by mouth daily. 30 tablet 0 09/28/2014 at Unknown time  . HYDROcodone-acetaminophen (NORCO/VICODIN) 5-325 MG per tablet Take 1 tablet by mouth every 4 (four) hours as needed. 40 tablet 0 Past Month at Unknown time  . ondansetron (ZOFRAN) 8 MG tablet Take 1 tablet (8 mg total) by mouth every 8 (eight) hours as needed for nausea. 30 tablet 3 Past Month at Unknown time  . pantoprazole (PROTONIX) 40 MG  tablet Take 1 tablet (40 mg total) by mouth daily at 12 noon. 30 tablet 0 09/28/2014 at Unknown time  . prochlorperazine (COMPAZINE) 10 MG tablet Take 1 tablet (10 mg total) by mouth every 6 (six) hours as needed for nausea. 30 tablet 3 Past Month at Unknown time  . zolpidem (AMBIEN) 10 MG tablet Take 1 tablet (10 mg total) by mouth at bedtime as needed for sleep. 30 tablet 0 Past Month  at Unknown time    Review of Systems  Constitutional: Denies fevers, chills or abnormal weight loss Eyes: Denies blurriness of vision Ears nose,mouth, throat, and face: Denies mucositis or sore throat Respiratory: Denies cough, dyspnea or wheezes Cardiovascular: Denies palpitation, chest discomfort or lower extremity swelling Gastrointestinal: Denies nausea, heartburn or change in bowel habits Skin: Denies abnormal skin rashes Lymphatics: Denies new lymphadenopathy or easy bruising Neurological:Denies numbness, tingling or new weaknesses Behavioral/Psych: Mood is stable, no new changes  All other systems were reviewed with the patient and are negative  Family History:  No family history on file.  Social History:  reports that she has never smoked. She has never used smokeless tobacco. She reports that she does not drink alcohol or use illicit drugs.  Physical Exam:   There were no vitals filed for this visit.  18 y.o. female  in no acute distress, alert, conversant.  GENERAL:alert, no distress and comfortable SKIN: skin color, texture, turgor are normal, no rashes or significant lesions EYES: normal, Conjunctiva are pink and non-injected, sclera clear OROPHARYNX:no exudate, no erythema and lips, buccal mucosa, and tongue normal  NECK: supple, thyroid normal size, non-tender, without nodularity LYMPH: no palpable lymphadenopathy in the cervical, axillary or inguinal LUNGS: clear to auscultation and percussion with normal breathing effort HEART: regular rate & rhythm and no murmurs and no lower extremity edema ABDOMEN:abdomen soft, non-tender and normal bowel sounds Musculoskeletal:no cyanosis of digits and no clubbing  NEURO: alert & oriented x 3 with fluent speech, no focal motor/sensory deficits  LABS: CBC   Recent Labs Lab 10/17/14 1416  WBC 14.1*  HGB 12.0  HCT 37.2  PLT 255  MCV 84.2  MCH 27.1  MCHC 32.3  RDW 15.9*  LYMPHSABS 1.3  MONOABS 0.5  EOSABS  0.1  BASOSABS 0.1     CMP    Recent Labs Lab 10/17/14 1416  NA 143  K 4.0  CO2 24  GLUCOSE 86  BUN 13.0  CREATININE 0.8  CALCIUM 9.6  AST 10  ALT 14  ALKPHOS 121  BILITOT 0.20        Component Value Date/Time   BILITOT 0.20 10/17/2014 1416   BILITOT <0.2* 10/01/2014 0545      Imaging Studies:I reviewed the PET scan with the patient and mother  A/P: 18 y.o. female   Diffuse large B cell lymphoma She had extensive stage IV disease with high-intermediate prognostic score (IPI=3).  The patient had tolerated cycle 2 very well. Will proceed with cycle 3 of infusional R-EPOCH day 1 cycle 3 on 10/20/2014 from day 1-5 and every 21 days, conventional way without dose adjustment She will receive prophylactic CNS treatment with intrathecal methotrexate Under interventional radiology guidance on 10/21/2014 She will need Lupron injection to prevent menstruation and preserve fertility next week On 12/14 she will receive Procrit injection along with Neulasta. She is on allopurinol to reduce the risk of tumor lysis syndrome prophylaxis  Jehova's witness Due to her religious beliefs, she will not receive blood products No signs of anemia noticed  at this time She will receive Procrit injection as preventive strategy against the need for transfusion.  DVT prophylaxis She will be on Lovenox start tomorrow after LP and IT chemo, and this can be discontinued if her platelet count drops to less than 50,000  Leukocytosis Due to recent Neulasta on 11/23, in the setting of lymphoma. She is afebrile No intervention indicated at this time  Full Code  [facility] E 10/20/2014 9:32 AM  Lindsey Puopolo, MD 10/20/2014

## 2014-10-20 NOTE — Progress Notes (Signed)
Patient received rituxan and tolerated well.This is her 3rd cycle for her chemo and had all the teachings done before. Chemo teaching reinforced. No complaints presented.

## 2014-10-20 NOTE — Telephone Encounter (Signed)
, °

## 2014-10-21 ENCOUNTER — Inpatient Hospital Stay (HOSPITAL_COMMUNITY): Payer: Medicaid Other

## 2014-10-21 NOTE — Procedures (Signed)
Informed consent was obtained from the patient prior to the procedure, including potential complications of headache, allergy, and pain. A 'time out' was performed. With the patient prone, the lower back was prepped with Betadine. 1% Lidocaine was used for local anesthesia. Lumbar puncture was performed at the L3-4 level using a 22 gauge  needle with return of clear CSF. 12 mg of methotrexate was injected into the subarachnoid space. The patient tolerated the procedure well without apparent complication.    IMPRESSION: Intrathecal injection of chemotherapy without complication. Third injection.

## 2014-10-21 NOTE — Care Management Note (Signed)
CARE MANAGEMENT NOTE 10/21/2014  Patient:  Lindsey Irwin, Lindsey Irwin   Account Number:  [id number]  Date Initiated:  10/21/2014  Documentation initiated by:  Marney Doctor  Subjective/Objective Assessment:   18 yo admitted for Planned chemo for her Diffuse Large B cell Lymphoma     Action/Plan:   From home with parent   Anticipated DC Date:  10/25/2014   Anticipated DC Plan:  [place]  CM consult      Choice offered to / List presented to:             Status of service:  In process, will continue to follow Medicare Important Message given?   (If response is "NO", the following Medicare IM given date fields will be blank) Date Medicare IM given:   Medicare IM given by:   Date Additional Medicare IM given:   Additional Medicare IM given by:    Discharge Disposition:    Per UR Regulation:  Reviewed for med. necessity/level of care/duration of stay  If discussed at [place] of Stay Meetings, dates discussed:    Comments:  10/21/14 Marney Doctor RN,BSN,NCM [phone] Chart reviewed and CM following for DC needs.

## 2014-10-21 NOTE — Progress Notes (Signed)
This RN confirmed the correct dosage of methotrexate for Ms. Megna. The chemo was sent with Ms Plair to radiology for the intrathecal administration

## 2014-10-21 NOTE — Progress Notes (Signed)
Lindsey Irwin   DOB:[birth year]   ZO#:[id number]   JWJ#:[id number]  Patient Care Team: No Pcp Per Patient as PCP - General (General Practice) I have seen the patient, examined her and edited the notes as follows  Subjective: Patient seen and examined. Tolerated Day 1 Chemo well. Afebrile. Denies fever, chills or night sweats. Denies shortness of breath or chest pain. Denies nausea, vomiting, diarrhea or mucositis. Appetite is normal. Last bowel movement on 12/7. Denies any headaches, vision changes or dizziness. Denies any rashes. No bleeding issues. Ambulating frequently. No confusion reported.  Scheduled Meds: . [START ON 10/24/2014] cyclophosphamide  750 mg/m2 (Treatment Plan Actual) Intravenous Once  . DOXOrubicin/vinCRIStine/etoposide CHEMO IV infusion for Inpatient CI   Intravenous Q24H  . enoxaparin (LOVENOX) injection  40 mg Subcutaneous Q24H  . methotrexate INTRATHECAL (+/- HYDROCORTISONE,Ara-C)   Intrathecal Once  . [START ON 10/24/2014] ondansetron (ZOFRAN) with dexamethasone (DECADRON) IV   Intravenous Once  . ondansetron [facility]) with dexamethasone (DECADRON) IV   Intravenous Q24H   Continuous Infusions: . sodium chloride 50 mL/hr at 10/21/14 0421  . sodium chloride     PRN Meds:.acetaminophen, alum & mag hydroxide-simeth, ondansetron **OR** ondansetron **OR** ondansetron (ZOFRAN) IV **OR** ondansetron (ZOFRAN) IV, senna-docusate, sodium chloride, sodium chloride, sodium chloride, sodium chloride   Objective:  Filed Vitals:   10/21/14 0555  BP: 119/59  Pulse: 62  Temp: 97.7 F (36.5 C)  Resp: 20     No intake or output data in the 24 hours ending 10/21/14 0704  ECOG PERFORMANCE STATUS:0  GENERAL:alert, no distress and comfortable SKIN: skin color, texture, turgor are normal, no rashes or significant lesions EYES: normal, conjunctiva are pink and non-injected, sclera clear OROPHARYNX:no exudate, no erythema and lips, buccal mucosa, and tongue normal  NECK: supple,  thyroid normal size, non-tender, without nodularity LYMPH:  no palpable lymphadenopathy in the cervical, axillary or inguinal LUNGS: clear to auscultation and percussion with normal breathing effort HEART: regular rate & rhythm and no murmurs and no lower extremity edema ABDOMEN: soft, non-tender and normal bowel sounds Musculoskeletal:no cyanosis of digits and no clubbing  PSYCH: alert & oriented x 3 with fluent speech NEURO: no focal motor/sensory deficits    CBG (last 3)  No results for input(s): GLUCAP in the last 72 hours.   Labs:   Recent Labs Lab 10/17/14 1416  WBC 14.1*  HGB 12.0  HCT 37.2  PLT 255  MCV 84.2  MCH 27.1  MCHC 32.3  RDW 15.9*  LYMPHSABS 1.3  MONOABS 0.5  EOSABS 0.1  BASOSABS 0.1     Chemistries:    Recent Labs Lab 10/17/14 1416  NA 143  K 4.0  CO2 24  GLUCOSE 86  BUN 13.0  CREATININE 0.8  CALCIUM 9.6  AST 10  ALT 14  ALKPHOS 121  BILITOT 0.20    GFR Estimated Creatinine Clearance: 134.1 mL/min (by C-G formula based on Cr of 0.8).  Liver Function Tests:  Recent Labs Lab 10/17/14 1416  AST 10  ALT 14  ALKPHOS 121  BILITOT 0.20  PROT 6.3*  ALBUMIN 3.5     Imaging Studies:  No results found.  Assessment/Plan: 18 y.o.  Diffuse large B cell lymphoma She had extensive stage IV disease with high- intermediate prognostic score (IPI=3). PET scan on 12/1 shows marked response to therapy. She was admitted for cycle 3 of infusional R-EPOCH day 1 cycle 3 on 10/20/2014 from day 1-5 and every 21 days, conventional way, without dose adjustment. She tolerated Rituxan well without  toxic side effects. She will receive prophylactic CNS treatment with intrathecal methotrexate under interventional radiology guidance today,10/21/2014 She will need Lupron injection to prevent menstruation and preserve fertility next week On 12/14 she will receive Procrit injection along with Neulasta. She is on allopurinol to reduce the risk of tumor  lysis syndrome prophylaxis  Jehovah's witness Due to her religious beliefs, she will not receive blood products No signs of anemia noticed at this time She will receive Procrit injection as preventive strategy against the need for transfusion.  DVT prophylaxis She will be on Lovenox start today after LP and IT chemo, and this can be discontinued if her platelet count drops to less than 50,000  Leukocytosis Due to recent Neulasta on 11/23, in the setting of lymphoma. She is afebrile No intervention indicated at this time  Full Code   Rondel Jumbo, PA-C 10/21/2014  7:04 AM Lindsey Lobban, MD 10/21/2014

## 2014-10-21 NOTE — Plan of Care (Signed)
Problem: Phase I Progression Outcomes Goal: Absence of IV chemotherapy infiltration Outcome: Progressing Goal: IV site with good blood return Outcome: Progressing Goal: Initiate chemo per MD order Outcome: Progressing Goal: Pain controlled with appropriate interventions Outcome: Completed/Met Date Met:  10/21/14 Goal: OOB as tolerated unless otherwise ordered Outcome: Completed/Met Date Met:  10/21/14 Goal: Initial discharge plan identified Outcome: Completed/Met Date Met:  10/21/14 Goal: Voiding-avoid urinary catheter unless indicated Outcome: Completed/Met Date Met:  10/21/14 Goal: Hemodynamically stable Outcome: Progressing

## 2014-10-21 NOTE — Progress Notes (Signed)
Pt to receive Methotrexate IT down in IR today.  MTX verified by Laural Benes, RN and Kirkland Hun, RN

## 2014-10-21 NOTE — Progress Notes (Signed)
Pt due for day 2,  cycle 3 of R-EPOCH today. Chemo dosages verified based on pt's current BSA and MD orders. Verified by Laural Benes, RN and Drue Dun, RN

## 2014-10-22 NOTE — Progress Notes (Signed)
Lindsey Irwin   DOB:[birth year]   BZ#:[id number]   BOF#:[id number]  Patient Care Team: No Pcp Per Patient as PCP - General (General Practice)  I have seen the patient, examined her and edited the notes as follows  Subjective: Patient seen and examined. Tolerated Day 2 R-EPOCH chemo and Intrathecal Therapy on 12/8 well. Afebrile. Denies fever, chills or night sweats. Denies shortness of breath or chest pain. Denies nausea, vomiting, diarrhea or mucositis. Appetite is normal. Last bowel movement on 12/8. Denies any headaches, vision changes or dizziness. Denies any rashes, numbness or tingling. No bleeding issues. Ambulating frequently. No confusion reported.  Scheduled Meds: Scheduled Meds: . [START ON 10/24/2014] cyclophosphamide  750 mg/m2 (Treatment Plan Actual) Intravenous Once  . DOXOrubicin/vinCRIStine/etoposide CHEMO IV infusion for Inpatient CI   Intravenous Q24H  . enoxaparin (LOVENOX) injection  40 mg Subcutaneous Q24H  . [START ON 10/24/2014] ondansetron (ZOFRAN) with dexamethasone (DECADRON) IV   Intravenous Once  . ondansetron [facility]) with dexamethasone (DECADRON) IV   Intravenous Q24H   Continuous Infusions: . sodium chloride 50 mL/hr at 10/21/14 0421  . sodium chloride     PRN Meds:.acetaminophen, alum & mag hydroxide-simeth, ondansetron **OR** ondansetron **OR** ondansetron (ZOFRAN) IV **OR** ondansetron (ZOFRAN) IV, senna-docusate, sodium chloride, sodium chloride, sodium chloride, sodium chloride Objective:  Filed Vitals:   10/21/14 2158  BP: 112/53  Pulse: 56  Temp: 98.2 F (36.8 C)  Resp: 16      Intake/Output Summary (Last 24 hours) at 10/22/14 7782 Last data filed at 10/21/14 2159  Gross per 24 hour  Intake    480 ml  Output   2200 ml  Net  -1720 ml    ECOG PERFORMANCE STATUS:0  GENERAL:alert, no distress and comfortable SKIN: skin color, texture, turgor are normal, no rashes or significant lesions EYES: normal, conjunctiva are pink and non-injected, sclera  clear OROPHARYNX:no exudate, no erythema and lips, buccal mucosa, and tongue normal  NECK: supple, thyroid normal size, non-tender, without nodularity LYMPH:  no palpable lymphadenopathy in the cervical, axillary or inguinal LUNGS: clear to auscultation and percussion with normal breathing effort HEART: regular rate & rhythm and no murmurs and no lower extremity edema ABDOMEN: soft, non-tender and normal bowel sounds Musculoskeletal:no cyanosis of digits and no clubbing  PSYCH: alert & oriented x 3 with fluent speech NEURO: no focal motor/sensory deficits    CBG (last 3)  No results for input(s): GLUCAP in the last 72 hours.   Labs:   Recent Labs Lab 10/17/14 1416  WBC 14.1*  HGB 12.0  HCT 37.2  PLT 255  MCV 84.2  MCH 27.1  MCHC 32.3  RDW 15.9*  LYMPHSABS 1.3  MONOABS 0.5  EOSABS 0.1  BASOSABS 0.1     Chemistries:    Recent Labs Lab 10/17/14 1416  NA 143  K 4.0  CO2 24  GLUCOSE 86  BUN 13.0  CREATININE 0.8  CALCIUM 9.6  AST 10  ALT 14  ALKPHOS 121  BILITOT 0.20    GFR Estimated Creatinine Clearance: 134.1 mL/min (by C-G formula based on Cr of 0.8).  Liver Function Tests:  Recent Labs Lab 10/17/14 1416  AST 10  ALT 14  ALKPHOS 121  BILITOT 0.20  PROT 6.3*  ALBUMIN 3.5     Imaging Studies:  Dg Fluoro Guide Spinal/si Jt Inj Left  10/21/2014   CLINICAL DATA:  Diffuse large B-cell lymphoma.  EXAM: FLUOROSCOPICALLY GUIDED LUMBAR PUNCTURE FOR INTRATHECAL  CHEMOTHERAPY  TECHNIQUE: Informed consent was obtained from the patient prior  to the procedure, including potential complications of headache, allergy, and pain. A 'time out' was performed. With the patient prone, the lower back was prepped with Betadine. 1% Lidocaine was used for local anesthesia. Lumbar puncture was performed at the L3-L4 using a 22 gauge  needle with return of clear CSF. 12 mg of methotrexate was injected into the subarachnoid space. The patient tolerated the procedure well  without apparent complication.  FLUOROSCOPY TIME:  1 min 19 seconds  IMPRESSION: Intrathecal injection of chemotherapy without complication. Third methotrexate injection.   Electronically Signed   By: Suzy Bouchard M.D.   On: 10/21/2014 17:34    Assessment/Plan: 18 y.o.  Diffuse large B cell lymphoma She had extensive stage IV disease with high- intermediate prognostic score (IPI=3). PET scan on 12/1 shows marked response to therapy. She was admitted for cycle 3 of infusional R-EPOCH day 1 cycle 3 on 10/20/2014 from day 1-5 and every 21 days, conventional way, without dose adjustment. She tolerated Day 2 chemotherapy without toxic side effects. She received prophylactic CNS treatment with intrathecal methotrexate under interventional radiology guidance 10/21/2014 without any complications. She will need Lupron injection to prevent menstruation and preserve fertility next week On 12/14 she will receive Procrit injection along with Neulasta. She is on allopurinol to reduce the risk of tumor lysis syndrome prophylaxis  Jehovah's witness Due to her religious beliefs, she will not receive blood products No signs of anemia noticed at this time She will receive Procrit injection as preventive strategy against the need for transfusion.  DVT prophylaxis Lovenox was started on 12/8 after LP and IT chemo, and this can be discontinued if her platelet count drops to less than 50,000  Leukocytosis Due to recent Neulasta on 11/23 and current Decadron, in the setting of lymphoma. She is afebrile No intervention indicated at this time  Full Code   Rondel Jumbo, PA-C 10/22/2014  6:55 AM Weaver Tweed, MD 10/22/2014

## 2014-10-23 DIAGNOSIS — Z7901 Long term (current) use of anticoagulants: Secondary | ICD-10-CM

## 2014-10-23 DIAGNOSIS — D72829 Elevated white blood cell count, unspecified: Secondary | ICD-10-CM

## 2014-10-23 NOTE — Progress Notes (Signed)
Lindsey Irwin   DOB:11/08/1996   FT#:[id number]   HCW#:[id number]  Patient Care Team: No Pcp Per Patient as PCP - General (General Practice)  I have seen the patient, examined her and edited the notes as follows Subjective: Patient seen and examined. Tolerated Day 3 R-EPOCH chemo well. Afebrile. Denies fever, chills or night sweats. Denies shortness of breath or chest pain. Denies nausea, vomiting, diarrhea or mucositis. Appetite is normal. Last bowel movement on 12/9. Denies any headaches, vision changes or dizziness. Denies any rashes, numbness or tingling. No bleeding issues. Ambulating frequently. No confusion reported.  Scheduled Meds: Scheduled Meds: . [START ON 10/24/2014] cyclophosphamide  750 mg/m2 (Treatment Plan Actual) Intravenous Once  . DOXOrubicin/vinCRIStine/etoposide CHEMO IV infusion for Inpatient CI   Intravenous Q24H  . enoxaparin (LOVENOX) injection  40 mg Subcutaneous Q24H  . [START ON 10/24/2014] ondansetron (ZOFRAN) with dexamethasone (DECADRON) IV   Intravenous Once  . ondansetron [facility]) with dexamethasone (DECADRON) IV   Intravenous Q24H   Continuous Infusions: . sodium chloride 50 mL/hr at 10/21/14 0421  . sodium chloride     PRN Meds:.acetaminophen, alum & mag hydroxide-simeth, ondansetron **OR** ondansetron **OR** ondansetron (ZOFRAN) IV **OR** ondansetron (ZOFRAN) IV, senna-docusate, sodium chloride, sodium chloride, sodium chloride, sodium chloride Objective:  Filed Vitals:   10/23/14 0603  BP: 114/50  Pulse: 53  Temp: 97.3 F (36.3 C)  Resp: 16      Intake/Output Summary (Last 24 hours) at 10/23/14 0813 Last data filed at 10/23/14 1761  Gross per 24 hour  Intake    480 ml  Output   2200 ml  Net  -1720 ml    ECOG PERFORMANCE STATUS:0  GENERAL:alert, no distress and comfortable SKIN: skin color, texture, turgor are normal, no rashes or significant lesions EYES: normal, conjunctiva are pink and non-injected, sclera clear OROPHARYNX:no exudate, no  erythema and lips, buccal mucosa, and tongue normal  NECK: supple, thyroid normal size, non-tender, without nodularity LYMPH:  no palpable lymphadenopathy in the cervical, axillary or inguinal LUNGS: clear to auscultation and percussion with normal breathing effort HEART: regular rate & rhythm and no murmurs and no lower extremity edema ABDOMEN: soft, non-tender and normal bowel sounds Musculoskeletal:no cyanosis of digits and no clubbing  PSYCH: alert & oriented x 3 with fluent speech NEURO: no focal motor/sensory deficits    CBG (last 3)  No results for input(s): GLUCAP in the last 72 hours.   Labs:   Recent Labs Lab 10/17/14 1416  WBC 14.1*  HGB 12.0  HCT 37.2  PLT 255  MCV 84.2  MCH 27.1  MCHC 32.3  RDW 15.9*  LYMPHSABS 1.3  MONOABS 0.5  EOSABS 0.1  BASOSABS 0.1     Chemistries:    Recent Labs Lab 10/17/14 1416  NA 143  K 4.0  CO2 24  GLUCOSE 86  BUN 13.0  CREATININE 0.8  CALCIUM 9.6  AST 10  ALT 14  ALKPHOS 121  BILITOT 0.20    GFR Estimated Creatinine Clearance: 134.1 mL/min (by C-G formula based on Cr of 0.8).  Liver Function Tests:  Recent Labs Lab 10/17/14 1416  AST 10  ALT 14  ALKPHOS 121  BILITOT 0.20  PROT 6.3*  ALBUMIN 3.5     Imaging Studies:  Dg Fluoro Guide Spinal/si Jt Inj Left  10/21/2014   CLINICAL DATA:  Diffuse large B-cell lymphoma.  EXAM: FLUOROSCOPICALLY GUIDED LUMBAR PUNCTURE FOR INTRATHECAL  CHEMOTHERAPY  TECHNIQUE: Informed consent was obtained from the patient prior to the procedure, including potential complications  of headache, allergy, and pain. A 'time out' was performed. With the patient prone, the lower back was prepped with Betadine. 1% Lidocaine was used for local anesthesia. Lumbar puncture was performed at the L3-L4 using a 22 gauge  needle with return of clear CSF. 12 mg of methotrexate was injected into the subarachnoid space. The patient tolerated the procedure well without apparent complication.   FLUOROSCOPY TIME:  1 min 19 seconds  IMPRESSION: Intrathecal injection of chemotherapy without complication. Third methotrexate injection.   Electronically Signed   By: Suzy Bouchard M.D.   On: 10/21/2014 17:34    Assessment/Plan: 18 y.o.  Diffuse large B cell lymphoma She had extensive stage IV disease with high- intermediate prognostic score (IPI=3). PET scan on 12/1 shows marked response to therapy. She was admitted for cycle 3 of infusional R-EPOCH day 1 cycle 3 on 10/20/2014 from day 1-5 and every 21 days, conventional way, without dose adjustment. She tolerated Day 3 chemotherapy without toxic side effects. She received prophylactic CNS treatment with intrathecal methotrexate under interventional radiology guidance 10/21/2014 without any complications. She will need Lupron injection to prevent menstruation and preserve fertility next week On 12/14 she will receive Procrit injection along with Neulasta. She is on allopurinol to reduce the risk of tumor lysis syndrome prophylaxis  Jehovah's witness Due to her religious beliefs, she will not receive blood products No signs of anemia noticed at this time She will receive Procrit injection as preventive strategy against the need for transfusion.  DVT prophylaxis Lovenox was started on 12/8 after LP and IT chemo, and this can be discontinued if her platelet count drops to less than 50,000  Leukocytosis Due to recent Neulasta on 11/23 and current Decadron, in the setting of lymphoma. She is afebrile No intervention indicated at this time  Full Code  Plan to discharge tomorrow after chemo is completed  Rondel Jumbo, PA-C 10/23/2014  8:13 AM .Alvy Bimler, Ireanna Finlayson, MD 10/23/2014

## 2014-10-23 NOTE — Progress Notes (Signed)
Chemo dosages verified using BSA and medication normal dosages. Verified by Laural Benes, RN and Aldean Baker, RN

## 2014-10-24 ENCOUNTER — Other Ambulatory Visit: Payer: Self-pay

## 2014-10-24 MED ORDER — HEPARIN SOD (PORK) LOCK FLUSH 100 UNIT/ML IV SOLN
500.0000 [IU] | Freq: Once | INTRAVENOUS | Status: DC
  Filled 2014-10-24: qty 5

## 2014-10-24 NOTE — Plan of Care (Signed)
Problem: Phase I Progression Outcomes Goal: Absence of IV chemotherapy infiltration Outcome: Completed/Met Date Met:  10/24/14     

## 2014-10-24 NOTE — Plan of Care (Signed)
Problem: Phase I Progression Outcomes Goal: Initiate chemo per MD order Outcome: Completed/Met Date Met:  10/24/14

## 2014-10-24 NOTE — Plan of Care (Signed)
Problem: Phase I Progression Outcomes Goal: IV site with good blood return Outcome: Completed/Met Date Met:  10/24/14

## 2014-10-24 NOTE — Discharge Summary (Signed)
Patient ID: Lindsey Irwin MRN: [id number] DOB/AGE: [birth year] 18 y.o. I have seen the patient, examined her and edited the notes as follows  Admit date: 10/20/2014 Discharge date: 10/24/2014  Patient Care Team: No Pcp Per Patient as PCP - General (General Practice)  Discharge Diagnoses:   Present on Admission:  . Diffuse large B cell lymphoma   Past Medical History  Diagnosis Date  . Breast mass 08/2014  . Diffuse large B cell lymphoma 09/02/2014  . Insomnia 09/24/2014    Discharge Medications:    Medication List    STOP taking these medications        allopurinol 300 MG tablet  Commonly known as:  ZYLOPRIM     pantoprazole 40 MG tablet  Commonly known as:  PROTONIX     PRESCRIPTION MEDICATION      TAKE these medications        HYDROcodone-acetaminophen 5-325 MG per tablet  Commonly known as:  NORCO/VICODIN  Take 1 tablet by mouth every 4 (four) hours as needed.     ondansetron 8 MG tablet  Commonly known as:  ZOFRAN  Take 1 tablet (8 mg total) by mouth every 8 (eight) hours as needed for nausea.     prochlorperazine 10 MG tablet  Commonly known as:  COMPAZINE  Take 1 tablet (10 mg total) by mouth every 6 (six) hours as needed for nausea.     zolpidem 10 MG tablet  Commonly known as:  AMBIEN  Take 1 tablet (10 mg total) by mouth at bedtime as needed for sleep.        Discharge Condition: Stable  Diet recommendation:  Regular.   Disposition and Follow-up: Office to call for schedule   ECOG PERFORMANCE STATUS:0   Brief History of Present Illness: For complete details please refer to admission H and P, but in brief, Lindsey Irwin is an 18 y.o. female with a history of Diffuse large B Cell Lymphoma, admitted to the hospital on 12/7 for the initiation of Cycle 3 of Chemotherapy with [facility] Course:   Diffuse large B cell lymphoma She had extensive stage IV disease with high- intermediate prognostic score (IPI=3). PET scan on 12/1  shows marked response to therapy. She was admitted for cycle 3 of infusional R-EPOCH day 1 cycle 3 on 10/20/2014 from day 1-5 and every 21 days, conventional way, without dose adjustment. She tolerated chemotherapy without toxic side effects. She received prophylactic CNS treatment with intrathecal methotrexate under interventional radiology guidance 10/21/2014 without any complications. She will need Lupron injection to prevent menstruation and preserve fertility next week On 12/14 she will receive Procrit injection along with Neulasta. She is on allopurinol to reduce the risk of tumor lysis syndrome prophylaxis  Jehovah's witness Due to her religious beliefs, she will not receive blood products No signs of anemia noticed at this time She will receive Procrit injection as preventive strategy against the need for transfusion.  DVT prophylaxis Lovenox was started on 12/8 after LP and IT chemo, and this was be discontinued if her platelet count drops to less than 50,000  Leukocytosis Due to recent Neulasta on 11/23 and current Decadron, in the setting of lymphoma. She is afebrile No intervention was needed during her hospitalization  Full Code  Physical Exam at Discharge: BP 98/42 mmHg  Pulse 54  Temp(Src) 97.8 F (36.6 C) (Oral)  Resp 16  Ht 5\' 6"  (1.676 m)  Wt 217 lb 9.5 oz (98.7 kg)  BMI 35.14 kg/m2  SpO2  99%  LMP 08/27/2014  Subjective:  Denies fever, chills or night sweats. Denies shortness of breath or chest pain. Denies nausea, vomiting, diarrhea or mucositis. Appetite is normal. Last bowel movement on 12/10. Denies any headaches, vision changes or dizziness. Denies any rashes, numbness or tingling. No bleeding issues. Ambulating frequently. No confusion reported.  Exam GENERAL:alert, no distress and comfortable SKIN: skin color, texture, turgor are normal, no rashes or significant lesions EYES: normal, conjunctiva are pink and non-injected, sclera clear OROPHARYNX:no  exudate, no erythema and lips, buccal mucosa, and tongue normal  NECK: supple, thyroid normal size, non-tender, without nodularity LYMPH:  no palpable lymphadenopathy in the cervical, axillary or inguinal LUNGS: clear to auscultation and percussion with normal breathing effort HEART: regular rate & rhythm and no murmurs and no lower extremity edema ABDOMEN:abdomen soft, non-tender and normal bowel sounds Musculoskeletal:no cyanosis of digits and no clubbing  PSYCH: alert & oriented x 3 with fluent speech NEURO: no focal motor/sensory deficits    Significant Diagnostic Studies:  Dg Fluoro Guide Spinal/si Jt Inj Left 10/21/2014   CLINICAL DATA:  Diffuse large B-cell lymphoma.  EXAM: FLUOROSCOPICALLY GUIDED LUMBAR PUNCTURE FOR INTRATHECAL  CHEMOTHERAPY  TECHNIQUE: Informed consent was obtained from the patient prior to the procedure, including potential complications of headache, allergy, and pain. A 'time out' was performed. With the patient prone, the lower back was prepped with Betadine. 1% Lidocaine was used for local anesthesia. Lumbar puncture was performed at the L3-L4 using a 22 gauge  needle with return of clear CSF. 12 mg of methotrexate was injected into the subarachnoid space. The patient tolerated the procedure well without apparent complication.  FLUOROSCOPY TIME:  1 min 19 seconds  IMPRESSION: Intrathecal injection of chemotherapy without complication. Third methotrexate injection.   Electronically Signed   By: Suzy Bouchard M.D.   On: 10/21/2014 17:34    Discharge Laboratory Values:  CBC  Recent Labs Lab 10/17/14 1416  WBC 14.1*  HGB 12.0  HCT 37.2  PLT 255  MCV 84.2  MCH 27.1  MCHC 32.3  RDW 15.9*  LYMPHSABS 1.3  MONOABS 0.5  EOSABS 0.1  BASOSABS 0.1     Chemistries   Recent Labs Lab 10/17/14 1416  NA 143  K 4.0  CO2 24  GLUCOSE 86  BUN 13.0  CREATININE 0.8  CALCIUM 9.6     Signed: Sharene Butters, PA-C/ Dr. Alvy Bimler 10/24/2014, 8:06 AM Timothey Dahlstrom,  MD 10/24/2014 Spent 40 minutes on discharge.

## 2014-10-24 NOTE — Plan of Care (Signed)
Problem: Phase I Progression Outcomes Goal: Hydration per MD order Outcome: Completed/Met Date Met:  10/24/14

## 2014-10-27 ENCOUNTER — Ambulatory Visit (HOSPITAL_BASED_OUTPATIENT_CLINIC_OR_DEPARTMENT_OTHER): Payer: Medicaid Other

## 2014-10-27 DIAGNOSIS — N189 Chronic kidney disease, unspecified: Secondary | ICD-10-CM

## 2014-10-27 DIAGNOSIS — C8330 Diffuse large B-cell lymphoma, unspecified site: Secondary | ICD-10-CM

## 2014-10-27 DIAGNOSIS — D631 Anemia in chronic kidney disease: Secondary | ICD-10-CM

## 2014-10-27 DIAGNOSIS — IMO0001 Reserved for inherently not codable concepts without codable children: Secondary | ICD-10-CM

## 2014-10-27 DIAGNOSIS — Z5189 Encounter for other specified aftercare: Secondary | ICD-10-CM

## 2014-10-27 DIAGNOSIS — Z789 Other specified health status: Secondary | ICD-10-CM

## 2014-10-27 DIAGNOSIS — Z452 Encounter for adjustment and management of vascular access device: Secondary | ICD-10-CM

## 2014-10-27 MED ORDER — PEGFILGRASTIM INJECTION 6 MG/0.6ML
6.0000 mg | Freq: Once | SUBCUTANEOUS | Status: AC
  Administered 2014-10-27: 6 mg via SUBCUTANEOUS
  Filled 2014-10-27: qty 0.6

## 2014-10-27 MED ORDER — HEPARIN SOD (PORK) LOCK FLUSH 100 UNIT/ML IV SOLN
500.0000 [IU] | Freq: Once | INTRAVENOUS | Status: AC
  Administered 2014-10-27: 500 [IU] via INTRAVENOUS
  Filled 2014-10-27: qty 5

## 2014-10-27 MED ORDER — EPOETIN ALFA 40000 UNIT/ML IJ SOLN
40000.0000 [IU] | INTRAMUSCULAR | Status: DC
  Administered 2014-10-27: 40000 [IU] via SUBCUTANEOUS
  Filled 2014-10-27: qty 1

## 2014-10-27 MED ORDER — SODIUM CHLORIDE 0.9 % IJ SOLN
10.0000 mL | INTRAMUSCULAR | Status: DC | PRN
  Administered 2014-10-27: 10 mL via INTRAVENOUS
  Filled 2014-10-27: qty 10

## 2014-10-27 NOTE — Patient Instructions (Signed)
Pegfilgrastim injection What is this medicine? PEGFILGRASTIM (peg fil GRA stim) is a long-acting granulocyte colony-stimulating factor that stimulates the growth of neutrophils, a type of white blood cell important in the body's fight against infection. It is used to reduce the incidence of fever and infection in patients with certain types of cancer who are receiving chemotherapy that affects the bone marrow. This medicine may be used for other purposes; ask your health care provider or pharmacist if you have questions. COMMON BRAND NAME(S): Neulasta What should I tell my health care provider before I take this medicine? They need to know if you have any of these conditions: -latex allergy -ongoing radiation therapy -sickle cell disease -skin reactions to acrylic adhesives (On-Body Injector only) -an unusual or allergic reaction to pegfilgrastim, filgrastim, other medicines, foods, dyes, or preservatives -pregnant or trying to get pregnant -breast-feeding How should I use this medicine? This medicine is for injection under the skin. If you get this medicine at home, you will be taught how to prepare and give the pre-filled syringe or how to use the On-body Injector. Refer to the patient Instructions for Use for detailed instructions. Use exactly as directed. Take your medicine at regular intervals. Do not take your medicine more often than directed. It is important that you put your used needles and syringes in a special sharps container. Do not put them in a trash can. If you do not have a sharps container, call your pharmacist or healthcare provider to get one. Talk to your pediatrician regarding the use of this medicine in children. Special care may be needed. Overdosage: If you think you have taken too much of this medicine contact a poison control center or emergency room at once. NOTE: This medicine is only for you. Do not share this medicine with others. What if I miss a dose? It is  important not to miss your dose. Call your doctor or health care professional if you miss your dose. If you miss a dose due to an On-body Injector failure or leakage, a new dose should be administered as soon as possible using a single prefilled syringe for manual use. What may interact with this medicine? Interactions have not been studied. Give your health care provider a list of all the medicines, herbs, non-prescription drugs, or dietary supplements you use. Also tell them if you smoke, drink alcohol, or use illegal drugs. Some items may interact with your medicine. This list may not describe all possible interactions. Give your health care provider a list of all the medicines, herbs, non-prescription drugs, or dietary supplements you use. Also tell them if you smoke, drink alcohol, or use illegal drugs. Some items may interact with your medicine. What should I watch for while using this medicine? You may need blood work done while you are taking this medicine. If you are going to need a MRI, CT scan, or other procedure, tell your doctor that you are using this medicine (On-Body Injector only). What side effects may I notice from receiving this medicine? Side effects that you should report to your doctor or health care professional as soon as possible: -allergic reactions like skin rash, itching or hives, swelling of the face, lips, or tongue -dizziness -fever -pain, redness, or irritation at site where injected -pinpoint red spots on the skin -shortness of breath or breathing problems -stomach or side pain, or pain at the shoulder -swelling -tiredness -trouble passing urine Side effects that usually do not require medical attention (report to your doctor   or health care professional if they continue or are bothersome): -bone pain -muscle pain This list may not describe all possible side effects. Call your doctor for medical advice about side effects. You may report side effects to FDA at  1-800-FDA-1088. Where should I keep my medicine? Keep out of the reach of children. Store pre-filled syringes in a refrigerator between 2 and 8 degrees C (36 and 46 degrees F). Do not freeze. Keep in carton to protect from light. Throw away this medicine if it is left out of the refrigerator for more than 48 hours. Throw away any unused medicine after the expiration date. NOTE: This sheet is a summary. It may not cover all possible information. If you have questions about this medicine, talk to your doctor, pharmacist, or health care provider.  2015, Elsevier/Gold Standard. (2014-01-30 16:14:05) Epoetin Alfa injection What is this medicine? EPOETIN ALFA (e POE e tin AL fa) helps your body make more red blood cells. This medicine is used to treat anemia caused by chronic kidney failure, cancer chemotherapy, or HIV-therapy. It may also be used before surgery if you have anemia. This medicine may be used for other purposes; ask your health care provider or pharmacist if you have questions. COMMON BRAND NAME(S): Epogen, Procrit What should I tell my health care provider before I take this medicine? They need to know if you have any of these conditions: -blood clotting disorders -cancer patient not on chemotherapy -cystic fibrosis -heart disease, such as angina or heart failure -hemoglobin level of 12 g/dL or greater -high blood pressure -low levels of folate, iron, or vitamin B12 -seizures -an unusual or allergic reaction to erythropoietin, albumin, benzyl alcohol, hamster proteins, other medicines, foods, dyes, or preservatives -pregnant or trying to get pregnant -breast-feeding How should I use this medicine? This medicine is for injection into a vein or under the skin. It is usually given by a health care professional in a hospital or clinic setting. If you get this medicine at home, you will be taught how to prepare and give this medicine. Use exactly as directed. Take your medicine at  regular intervals. Do not take your medicine more often than directed. It is important that you put your used needles and syringes in a special sharps container. Do not put them in a trash can. If you do not have a sharps container, call your pharmacist or healthcare provider to get one. Talk to your pediatrician regarding the use of this medicine in children. While this drug may be prescribed for selected conditions, precautions do apply. Overdosage: If you think you have taken too much of this medicine contact a poison control center or emergency room at once. NOTE: This medicine is only for you. Do not share this medicine with others. What if I miss a dose? If you miss a dose, take it as soon as you can. If it is almost time for your next dose, take only that dose. Do not take double or extra doses. What may interact with this medicine? Do not take this medicine with any of the following medications: -darbepoetin alfa This list may not describe all possible interactions. Give your health care provider a list of all the medicines, herbs, non-prescription drugs, or dietary supplements you use. Also tell them if you smoke, drink alcohol, or use illegal drugs. Some items may interact with your medicine. What should I watch for while using this medicine? Visit your prescriber or health care professional for regular checks on your progress  and for the needed blood tests and blood pressure measurements. It is especially important for the doctor to make sure your hemoglobin level is in the desired range, to limit the risk of potential side effects and to give you the best benefit. Keep all appointments for any recommended tests. Check your blood pressure as directed. Ask your doctor what your blood pressure should be and when you should contact him or her. As your body makes more red blood cells, you may need to take iron, folic acid, or vitamin B supplements. Ask your doctor or health care provider which  products are right for you. If you have kidney disease continue dietary restrictions, even though this medication can make you feel better. Talk with your doctor or health care professional about the foods you eat and the vitamins that you take. What side effects may I notice from receiving this medicine? Side effects that you should report to your doctor or health care professional as soon as possible: -allergic reactions like skin rash, itching or hives, swelling of the face, lips, or tongue -breathing problems -changes in vision -chest pain -confusion, trouble speaking or understanding -feeling faint or lightheaded, falls -high blood pressure -muscle aches or pains -pain, swelling, warmth in the leg -rapid weight gain -severe headaches -sudden numbness or weakness of the face, arm or leg -trouble walking, dizziness, loss of balance or coordination -seizures (convulsions) -swelling of the ankles, feet, hands -unusually weak or tired Side effects that usually do not require medical attention (report to your doctor or health care professional if they continue or are bothersome): -diarrhea -fever, chills (flu-like symptoms) -headaches -nausea, vomiting -redness, stinging, or swelling at site where injected This list may not describe all possible side effects. Call your doctor for medical advice about side effects. You may report side effects to FDA at 1-800-FDA-1088. Where should I keep my medicine? Keep out of the reach of children. Store in a refrigerator between 2 and 8 degrees C (36 and 46 degrees F). Do not freeze or shake. Throw away any unused portion if using a single-dose vial. Multi-dose vials can be kept in the refrigerator for up to 21 days after the initial dose. Throw away unused medicine. NOTE: This sheet is a summary. It may not cover all possible information. If you have questions about this medicine, talk to your doctor, pharmacist, or health care provider.  2015,  Elsevier/Gold Standard. (2008-10-14 10:25:44) PICC Home Guide A peripherally inserted central catheter (PICC) is a long, thin, flexible tube that is inserted into a vein in the upper arm. It is a form of intravenous (IV) access. It is considered to be a "central" line because the tip of the PICC ends in a large vein in your chest. This large vein is called the superior vena cava (SVC). The PICC tip ends in the SVC because there is a lot of blood flow in the SVC. This allows medicines and IV fluids to be quickly distributed throughout the body. The PICC is inserted using a sterile technique by a specially trained nurse or physician. After the PICC is inserted, a chest X-ray exam is done to be sure it is in the correct place.  A PICC may be placed for different reasons, such as:  To give medicines and liquid nutrition that can only be given through a central line. Examples are:  Certain antibiotic treatments.  Chemotherapy.  Total parenteral nutrition (TPN).  To take frequent blood samples.  To give IV fluids and blood  products.  If there is difficulty placing a peripheral intravenous (PIV) catheter. If taken care of properly, a PICC can remain in place for several months. A PICC can also allow a person to go home from the hospital early. Medicine and PICC care can be managed at home by a family member or home health care team. [place] A PICC? Problems with a PICC can occasionally occur. These may include the following:  A blood clot (thrombus) forming in or at the tip of the PICC. This can cause the PICC to become clogged. A clot-dissolving medicine called tissue plasminogen activator (tPA) can be given through the PICC to help break up the clot.  Inflammation of the vein (phlebitis) in which the PICC is placed. Signs of inflammation may include redness, pain at the insertion site, red streaks, or being able to feel a "cord" in the vein where the PICC is  located.  Infection in the PICC or at the insertion site. Signs of infection may include fever, chills, redness, swelling, or pus drainage from the PICC insertion site.  PICC movement (malposition). The PICC tip may move from its original position due to excessive physical activity, forceful coughing, sneezing, or vomiting.  A break or cut in the PICC. It is important to not use scissors near the PICC.  Nerve or tendon irritation or injury during PICC insertion. WHAT SHOULD I KEEP IN MIND ABOUT ACTIVITIES WHEN I HAVE A PICC?  You may bend your arm and move it freely. If your PICC is near or at the bend of your elbow, avoid activity with repeated motion at the elbow.  Rest at home for the remainder of the day following PICC line insertion.  Avoid lifting heavy objects as instructed by your health care provider.  Avoid using a crutch with the arm on the same side as your PICC. You may need to use a walker. WHAT SHOULD I KNOW ABOUT MY PICC DRESSING?  Keep your PICC bandage (dressing) clean and dry to prevent infection.  Ask your health care provider when you may shower. Ask your health care provider to teach you how to wrap the PICC when you do take a shower.  Change the PICC dressing as instructed by your health care provider.  Change your PICC dressing if it becomes loose or wet. WHAT SHOULD I KNOW ABOUT PICC CARE?  Check the PICC insertion site daily for leakage, redness, swelling, or pain.  Do not take a bath, swim, or use hot tubs when you have a PICC. Cover PICC line with clear plastic wrap and tape to keep it dry while showering.  Flush the PICC as directed by your health care provider. Let your health care provider know right away if the PICC is difficult to flush or does not flush. Do not use force to flush the PICC.  Do not use a syringe that is less than 10 mL to flush the PICC.  Never pull or tug on the PICC.  Avoid blood pressure checks on the arm with the  PICC.  Keep your PICC identification card with you at all times.  Do not take the PICC out yourself. Only a trained clinical professional should remove the PICC. SEEK IMMEDIATE MEDICAL CARE IF:  Your PICC is accidentally pulled all the way out. If this happens, cover the insertion site with a bandage or gauze dressing. Do not throw the PICC away. Your health care provider will need to inspect it.  Your PICC was tugged or pulled and has partially come out. Do not  push the PICC back in.  There is any type of drainage, redness, or swelling where the PICC enters the skin.  You cannot flush the PICC, it is difficult to flush, or the PICC leaks around the insertion site when it is flushed.  You hear a "flushing" sound when the PICC is flushed.  You have pain, discomfort, or numbness in your arm, shoulder, or jaw on the same side as the PICC.  You feel your heart "racing" or skipping beats.  You notice a hole or tear in the PICC.  You develop chills or a fever. MAKE SURE YOU:   Understand these instructions.  Will watch your condition.  Will get help right away if you are not doing well or get worse. Document Released: 05/07/2003 Document Revised: 03/17/2014 Document Reviewed: 07/08/2013 [facility] Patient Information 2015 [place], [place]. This information is not intended to replace advice given to you by your health care provider. Make sure you discuss any questions you have with your health care provider.

## 2014-10-30 ENCOUNTER — Telehealth: Payer: Self-pay | Admitting: *Deleted

## 2014-10-30 ENCOUNTER — Ambulatory Visit (HOSPITAL_BASED_OUTPATIENT_CLINIC_OR_DEPARTMENT_OTHER): Payer: Medicaid Other

## 2014-10-30 VITALS — BP 130/71 | HR 101 | Temp 98.2°F

## 2014-10-30 DIAGNOSIS — Z452 Encounter for adjustment and management of vascular access device: Secondary | ICD-10-CM

## 2014-10-30 DIAGNOSIS — C8330 Diffuse large B-cell lymphoma, unspecified site: Secondary | ICD-10-CM

## 2014-10-30 MED ORDER — SODIUM CHLORIDE 0.9 % IJ SOLN
10.0000 mL | INTRAMUSCULAR | Status: DC | PRN
  Administered 2014-10-30: 10 mL via INTRAVENOUS
  Filled 2014-10-30: qty 10

## 2014-10-30 MED ORDER — HEPARIN SOD (PORK) LOCK FLUSH 100 UNIT/ML IV SOLN
500.0000 [IU] | Freq: Once | INTRAVENOUS | Status: AC
  Administered 2014-10-30: 250 [IU] via INTRAVENOUS
  Filled 2014-10-30: qty 5

## 2014-10-30 NOTE — Progress Notes (Signed)
Patient in for PICC flush today. Lumen #1 (red) accessed and flushed without any difficulty. No blood return noted from Lumen #1. Patient denies any pain or discomfort during or after flush. Lumen #2 (purple) accessed and flushed without any difficulty. Blood return noted from Lumen #2. Patient denies any pain or discomfort. After Visit Summary printed and given to Patient. Patient ambulating and discharged from Flush room without any complaints.

## 2014-10-30 NOTE — Telephone Encounter (Signed)
Patient called requesting appointment for P.I.C.C. Line care.  Dressing was changed 10-27-2014.  Needs weekly dressing appointments and flush appointment MWF.  P.O.F. Generated for schedulers.

## 2014-10-30 NOTE — Patient Instructions (Signed)
PICC Home Guide A peripherally inserted central catheter (PICC) is a long, thin, flexible tube that is inserted into a vein in the upper arm. It is a form of intravenous (IV) access. It is considered to be a "central" line because the tip of the PICC ends in a large vein in your chest. This large vein is called the superior vena cava (SVC). The PICC tip ends in the SVC because there is a lot of blood flow in the SVC. This allows medicines and IV fluids to be quickly distributed throughout the body. The PICC is inserted using a sterile technique by a specially trained nurse or physician. After the PICC is inserted, a chest X-ray exam is done to be sure it is in the correct place.  A PICC may be placed for different reasons, such as:  To give medicines and liquid nutrition that can only be given through a central line. Examples are:  Certain antibiotic treatments.  Chemotherapy.  Total parenteral nutrition (TPN).  To take frequent blood samples.  To give IV fluids and blood products.  If there is difficulty placing a peripheral intravenous (PIV) catheter. If taken care of properly, a PICC can remain in place for several months. A PICC can also allow a person to go home from the hospital early. Medicine and PICC care can be managed at home by a family member or home health care team. WHAT PROBLEMS CAN HAPPEN WHEN I HAVE A PICC? Problems with a PICC can occasionally occur. These may include the following:  A blood clot (thrombus) forming in or at the tip of the PICC. This can cause the PICC to become clogged. A clot-dissolving medicine called tissue plasminogen activator (tPA) can be given through the PICC to help break up the clot.  Inflammation of the vein (phlebitis) in which the PICC is placed. Signs of inflammation may include redness, pain at the insertion site, red streaks, or being able to feel a "cord" in the vein where the PICC is located.  Infection in the PICC or at the insertion  site. Signs of infection may include fever, chills, redness, swelling, or pus drainage from the PICC insertion site.  PICC movement (malposition). The PICC tip may move from its original position due to excessive physical activity, forceful coughing, sneezing, or vomiting.  A break or cut in the PICC. It is important to not use scissors near the PICC.  Nerve or tendon irritation or injury during PICC insertion. WHAT SHOULD I KEEP IN MIND ABOUT ACTIVITIES WHEN I HAVE A PICC?  You may bend your arm and move it freely. If your PICC is near or at the bend of your elbow, avoid activity with repeated motion at the elbow.  Rest at home for the remainder of the day following PICC line insertion.  Avoid lifting heavy objects as instructed by your health care provider.  Avoid using a crutch with the arm on the same side as your PICC. You may need to use a walker. WHAT SHOULD I KNOW ABOUT MY PICC DRESSING?  Keep your PICC bandage (dressing) clean and dry to prevent infection.  Ask your health care provider when you may shower. Ask your health care provider to teach you how to wrap the PICC when you do take a shower.  Change the PICC dressing as instructed by your health care provider.  Change your PICC dressing if it becomes loose or wet. WHAT SHOULD I KNOW ABOUT PICC CARE?  Check the PICC insertion site   daily for leakage, redness, swelling, or pain.  Do not take a bath, swim, or use hot tubs when you have a PICC. Cover PICC line with clear plastic wrap and tape to keep it dry while showering.  Flush the PICC as directed by your health care provider. Let your health care provider know right away if the PICC is difficult to flush or does not flush. Do not use force to flush the PICC.  Do not use a syringe that is less than 10 mL to flush the PICC.  Never pull or tug on the PICC.  Avoid blood pressure checks on the arm with the PICC.  Keep your PICC identification card with you at all  times.  Do not take the PICC out yourself. Only a trained clinical professional should remove the PICC. SEEK IMMEDIATE MEDICAL CARE IF:  Your PICC is accidentally pulled all the way out. If this happens, cover the insertion site with a bandage or gauze dressing. Do not throw the PICC away. Your health care provider will need to inspect it.  Your PICC was tugged or pulled and has partially come out. Do not  push the PICC back in.  There is any type of drainage, redness, or swelling where the PICC enters the skin.  You cannot flush the PICC, it is difficult to flush, or the PICC leaks around the insertion site when it is flushed.  You hear a "flushing" sound when the PICC is flushed.  You have pain, discomfort, or numbness in your arm, shoulder, or jaw on the same side as the PICC.  You feel your heart "racing" or skipping beats.  You notice a hole or tear in the PICC.  You develop chills or a fever. MAKE SURE YOU:   Understand these instructions.  Will watch your condition.  Will get help right away if you are not doing well or get worse. Document Released: 05/07/2003 Document Revised: 03/17/2014 Document Reviewed: 07/08/2013 Exit[employer] Patient Information 2015 [employer]. This information is not intended to replace advice given to you by your health care provider. Make sure you discuss any questions you have with your health care provider.  

## 2014-11-01 ENCOUNTER — Ambulatory Visit (HOSPITAL_BASED_OUTPATIENT_CLINIC_OR_DEPARTMENT_OTHER): Payer: Medicaid Other

## 2014-11-01 VITALS — BP 124/68 | HR 99 | Temp 97.0°F | Resp 18

## 2014-11-01 DIAGNOSIS — C8330 Diffuse large B-cell lymphoma, unspecified site: Secondary | ICD-10-CM

## 2014-11-01 DIAGNOSIS — Z452 Encounter for adjustment and management of vascular access device: Secondary | ICD-10-CM

## 2014-11-01 MED ORDER — HEPARIN SOD (PORK) LOCK FLUSH 100 UNIT/ML IV SOLN
500.0000 [IU] | Freq: Once | INTRAVENOUS | Status: AC
  Administered 2014-11-01: 500 [IU] via INTRAVENOUS
  Filled 2014-11-01: qty 5

## 2014-11-01 MED ORDER — SODIUM CHLORIDE 0.9 % IJ SOLN
10.0000 mL | INTRAMUSCULAR | Status: DC | PRN
  Administered 2014-11-01: 10 mL via INTRAVENOUS
  Filled 2014-11-01: qty 10

## 2014-11-03 ENCOUNTER — Encounter: Payer: Self-pay | Admitting: Hematology and Oncology

## 2014-11-03 ENCOUNTER — Encounter: Payer: Self-pay | Admitting: *Deleted

## 2014-11-03 ENCOUNTER — Ambulatory Visit (HOSPITAL_BASED_OUTPATIENT_CLINIC_OR_DEPARTMENT_OTHER): Payer: Medicaid Other | Admitting: Hematology and Oncology

## 2014-11-03 ENCOUNTER — Telehealth: Payer: Self-pay | Admitting: Hematology and Oncology

## 2014-11-03 ENCOUNTER — Ambulatory Visit (HOSPITAL_BASED_OUTPATIENT_CLINIC_OR_DEPARTMENT_OTHER): Payer: Medicaid Other

## 2014-11-03 ENCOUNTER — Ambulatory Visit (HOSPITAL_BASED_OUTPATIENT_CLINIC_OR_DEPARTMENT_OTHER): Payer: Medicaid Other | Admitting: Lab

## 2014-11-03 VITALS — BP 120/58 | HR 74 | Temp 98.8°F | Resp 18 | Ht 66.0 in | Wt 219.7 lb

## 2014-11-03 DIAGNOSIS — C8330 Diffuse large B-cell lymphoma, unspecified site: Secondary | ICD-10-CM

## 2014-11-03 DIAGNOSIS — IMO0001 Reserved for inherently not codable concepts without codable children: Secondary | ICD-10-CM

## 2014-11-03 DIAGNOSIS — Z452 Encounter for adjustment and management of vascular access device: Secondary | ICD-10-CM

## 2014-11-03 DIAGNOSIS — Z789 Other specified health status: Secondary | ICD-10-CM

## 2014-11-03 LAB — CBC WITH DIFFERENTIAL/PLATELET
BASO%: 0.6 % (ref 0.0–2.0)
BASOS ABS: 0.1 10*3/uL (ref 0.0–0.1)
EOS%: 0.8 % (ref 0.0–7.0)
Eosinophils Absolute: 0.2 10*3/uL (ref 0.0–0.5)
HCT: 37.7 % (ref 34.8–46.6)
HEMOGLOBIN: 11.8 g/dL (ref 11.6–15.9)
LYMPH%: 10 % — AB (ref 14.0–49.7)
MCH: 26.5 pg (ref 25.1–34.0)
MCHC: 31.4 g/dL — ABNORMAL LOW (ref 31.5–36.0)
MCV: 84.3 fL (ref 79.5–101.0)
MONO#: 1.1 10*3/uL — ABNORMAL HIGH (ref 0.1–0.9)
MONO%: 5.2 % (ref 0.0–14.0)
NEUT%: 83.4 % — ABNORMAL HIGH (ref 38.4–76.8)
NEUTROS ABS: 17.9 10*3/uL — AB (ref 1.5–6.5)
PLATELETS: 304 10*3/uL (ref 145–400)
RBC: 4.47 10*6/uL (ref 3.70–5.45)
RDW: 18.4 % — ABNORMAL HIGH (ref 11.2–14.5)
WBC: 21.5 10*3/uL — ABNORMAL HIGH (ref 3.9–10.3)
lymph#: 2.1 10*3/uL (ref 0.9–3.3)

## 2014-11-03 LAB — COMPREHENSIVE METABOLIC PANEL (CC13)
ALK PHOS: 156 U/L — AB (ref 40–150)
ALT: 17 U/L (ref 0–55)
AST: 11 U/L (ref 5–34)
Albumin: 3.6 g/dL (ref 3.5–5.0)
Anion Gap: 10 mEq/L (ref 3–11)
BUN: 8.7 mg/dL (ref 7.0–26.0)
CO2: 26 mEq/L (ref 22–29)
Calcium: 9.5 mg/dL (ref 8.4–10.4)
Chloride: 106 mEq/L (ref 98–109)
Creatinine: 0.8 mg/dL (ref 0.6–1.1)
GLUCOSE: 94 mg/dL (ref 70–140)
Potassium: 4 mEq/L (ref 3.5–5.1)
Sodium: 142 mEq/L (ref 136–145)
Total Protein: 6.5 g/dL (ref 6.4–8.3)

## 2014-11-03 LAB — LACTATE DEHYDROGENASE (CC13): LDH: 337 U/L — AB (ref 125–245)

## 2014-11-03 MED ORDER — HEPARIN SOD (PORK) LOCK FLUSH 100 UNIT/ML IV SOLN
500.0000 [IU] | Freq: Once | INTRAVENOUS | Status: AC
  Administered 2014-11-03: 250 [IU] via INTRAVENOUS
  Filled 2014-11-03: qty 5

## 2014-11-03 MED ORDER — EPOETIN ALFA 40000 UNIT/ML IJ SOLN
40000.0000 [IU] | INTRAMUSCULAR | Status: DC
  Administered 2014-11-03: 40000 [IU] via SUBCUTANEOUS
  Filled 2014-11-03: qty 1

## 2014-11-03 MED ORDER — SODIUM CHLORIDE 0.9 % IJ SOLN
10.0000 mL | INTRAMUSCULAR | Status: DC | PRN
  Administered 2014-11-03: 10 mL via INTRAVENOUS
  Filled 2014-11-03: qty 10

## 2014-11-03 NOTE — Progress Notes (Signed)
[place] OFFICE PROGRESS NOTE  Patient Care Team: No Pcp Per Patient as PCP - General (General Practice)  SUMMARY OF ONCOLOGIC HISTORY: Oncology History   Diffuse large B cell lymphoma   Primary site: Lymphoid Neoplasms   Staging method: AJCC 6th Edition   Clinical: Stage IV signed by Heath Lark, MD on 09/03/2014  8:18 PM   Summary: Stage IV IPI of 3: high LDH, >2 extranodal disease and stage IV at presentation       Diffuse large B cell lymphoma   07/30/2014 Imaging She had a bilateral mammogram which came back abnormal. Ultrasound-guided biopsy was performed, suspicious for lymphoma   08/25/2014 Imaging PET/CT scan showed diffuse metastatic cancer involving bilateral breasts, bone, and large abdominal mass   08/28/2014 Surgery She had excisional surgery and removal of right breast mass   08/28/2014 Pathology Results Accession: [id number] right breast mass is consistent with diffuse large B-cell lymphoma with high proliferation index.   09/04/2014 - 09/11/2014 Hospital Admission she is admitted to the hospital for cycle 1 of [place]   09/04/2014 Imaging echocardiogram is normal.   09/04/2014 Procedure she has placement of PICC line.   10/14/2014 Imaging PET/CT scan showed near complete response to treatment.    INTERVAL HISTORY: Please see below for problem oriented charting. She feels fine. Denies new lymphadenopathy  REVIEW OF SYSTEMS:   Constitutional: Denies fevers, chills or abnormal weight loss Eyes: Denies blurriness of vision Ears, nose, mouth, throat, and face: Denies mucositis or sore throat Respiratory: Denies cough, dyspnea or wheezes Cardiovascular: Denies palpitation, chest discomfort or lower extremity swelling Gastrointestinal:  Denies nausea, heartburn or change in bowel habits Skin: Denies abnormal skin rashes Lymphatics: Denies new lymphadenopathy or easy bruising Neurological:Denies numbness, tingling or new  weaknesses Behavioral/Psych: Mood is stable, no new changes  All other systems were reviewed with the patient and are negative.  I have reviewed the past medical history, past surgical history, social history and family history with the patient and they are unchanged from previous note.  ALLERGIES:  is allergic to other and soap.  MEDICATIONS:  Current Outpatient Prescriptions  Medication Sig Dispense Refill  . HYDROcodone-acetaminophen (NORCO/VICODIN) 5-325 MG per tablet Take 1 tablet by mouth every 4 (four) hours as needed. (Patient not taking: Reported on 11/03/2014) 40 tablet 0  . ondansetron (ZOFRAN) 8 MG tablet Take 1 tablet (8 mg total) by mouth every 8 (eight) hours as needed for nausea. (Patient not taking: Reported on 11/03/2014) 30 tablet 3  . prochlorperazine (COMPAZINE) 10 MG tablet Take 1 tablet (10 mg total) by mouth every 6 (six) hours as needed for nausea. (Patient not taking: Reported on 11/03/2014) 30 tablet 3  . zolpidem (AMBIEN) 10 MG tablet Take 1 tablet (10 mg total) by mouth at bedtime as needed for sleep. (Patient not taking: Reported on 11/03/2014) 30 tablet 0   Current Facility-Administered Medications  Medication Dose Route Frequency Provider Last Rate Last Dose  . epoetin alfa (EPOGEN,PROCRIT) injection 40,000 Units  40,000 Units Subcutaneous Weekly Heath Lark, MD   40,000 Units at 11/03/14 1214   Facility-Administered Medications Ordered in Other Visits  Medication Dose Route Frequency Provider Last Rate Last Dose  . sodium chloride 0.9 % injection 10 mL  10 mL Intravenous PRN Heath Lark, MD   10 mL at 11/03/14 1109    PHYSICAL EXAMINATION: ECOG PERFORMANCE STATUS: 0 - Asymptomatic  Filed Vitals:   11/03/14 1140  BP: 120/58  Pulse: 74  Temp: 98.8 F (37.1  C)  Resp: 18   Filed Weights   11/03/14 1140  Weight: 219 lb 11.2 oz (99.655 kg)    GENERAL:alert, no distress and comfortable. She is morbidly obese SKIN: skin color, texture, turgor are  normal, no rashes or significant lesions EYES: normal, Conjunctiva are pink and non-injected, sclera clear OROPHARYNX:no exudate, no erythema and lips, buccal mucosa, and tongue normal  NECK: supple, thyroid normal size, non-tender, without nodularity LYMPH:  no palpable lymphadenopathy in the cervical, axillary or inguinal LUNGS: clear to auscultation and percussion with normal breathing effort HEART: regular rate & rhythm and no murmurs and no lower extremity edema ABDOMEN:abdomen soft, non-tender and normal bowel sounds Musculoskeletal:no cyanosis of digits and no clubbing  NEURO: alert & oriented x 3 with fluent speech, no focal motor/sensory deficits  LABORATORY DATA:  I have reviewed the data as listed    Component Value Date/Time   NA 142 11/03/2014 1100   NA 141 10/01/2014 0545   K 4.0 11/03/2014 1100   K 3.8 10/01/2014 0545   CL 106 10/01/2014 0545   CO2 26 11/03/2014 1100   CO2 25 10/01/2014 0545   GLUCOSE 94 11/03/2014 1100   GLUCOSE 162* 10/01/2014 0545   BUN 8.7 11/03/2014 1100   BUN 10 10/01/2014 0545   CREATININE 0.8 11/03/2014 1100   CREATININE 0.56 10/01/2014 0545   CALCIUM 9.5 11/03/2014 1100   CALCIUM 9.2 10/01/2014 0545   PROT 6.5 11/03/2014 1100   PROT 6.4 10/01/2014 0545   ALBUMIN 3.6 11/03/2014 1100   ALBUMIN 3.2* 10/01/2014 0545   AST 11 11/03/2014 1100   AST 7 10/01/2014 0545   ALT 17 11/03/2014 1100   ALT 7 10/01/2014 0545   ALKPHOS 156* 11/03/2014 1100   ALKPHOS 83 10/01/2014 0545   BILITOT <0.20 11/03/2014 1100   BILITOT <0.2* 10/01/2014 0545   GFRNONAA >90 10/01/2014 0545   GFRAA >90 10/01/2014 0545    No results found for: SPEP, UPEP  Lab Results  Component Value Date   WBC 21.5* 11/03/2014   NEUTROABS 17.9* 11/03/2014   HGB 11.8 11/03/2014   HCT 37.7 11/03/2014   MCV 84.3 11/03/2014   PLT 304 11/03/2014      Chemistry      Component Value Date/Time   NA 142 11/03/2014 1100   NA 141 10/01/2014 0545   K 4.0 11/03/2014 1100    K 3.8 10/01/2014 0545   CL 106 10/01/2014 0545   CO2 26 11/03/2014 1100   CO2 25 10/01/2014 0545   BUN 8.7 11/03/2014 1100   BUN 10 10/01/2014 0545   CREATININE 0.8 11/03/2014 1100   CREATININE 0.56 10/01/2014 0545      Component Value Date/Time   CALCIUM 9.5 11/03/2014 1100   CALCIUM 9.2 10/01/2014 0545   ALKPHOS 156* 11/03/2014 1100   ALKPHOS 83 10/01/2014 0545   AST 11 11/03/2014 1100   AST 7 10/01/2014 0545   ALT 17 11/03/2014 1100   ALT 7 10/01/2014 0545   BILITOT <0.20 11/03/2014 1100   BILITOT <0.2* 10/01/2014 0545      ASSESSMENT & PLAN:  Diffuse large B cell lymphoma She tolerated cycle 3 very well. We will proceed with cycle 4, conventional way without dose adjustment. She has extensive stage IV disease with high-intermediate prognostic score (IPI=3).  She will proceed with treatment along with intrathecal methotrexate. She will need monthly Lupron injection to prevent menstruation and preserve fertility. She will be admitted to the hospital on 11/10/2014. I recommend intrathecal methotrexate under  IR guidance on 11/11/2014. After discharge, she will return to the [facility] on a weekly basis for supportive care. On 11/17/2013, she will receive Procrit injection along with Neulasta.  Patient is Jehov[employer]'s Witness Due to her religious belief, she will not received blood products. She will receive Procrit injection weekly. So far, she has no signs of anemia.   The cause of leukocytosis is due to recent Neulasta injection. She has no signs of infection  Orders Placed This Encounter  Procedures  . Pregnancy, urine    Standing Status: Future     Number of Occurrences:      Standing Expiration Date: 11/03/2015  . SCHEDULING COMMUNICATION INJECTION    Schedule 15 minute injection appointment   All questions were answered. The patient knows to call the clinic with any problems, questions or concerns. No barriers to learning was detected. I spent 25 minutes counseling  the patient face to face. The total time spent in the appointment was 30 minutes and more than 50% was on counseling and review of test results     [facility], [place], MD 11/03/2014 1:33 PM

## 2014-11-03 NOTE — Assessment & Plan Note (Signed)
Due to her religious belief, she will not received blood products. She will receive Procrit injection weekly. So far, she has no signs of anemia.

## 2014-11-03 NOTE — Progress Notes (Signed)
Planned admit for inpatient chemotherapy on 11/10/14 for R-Epoch per Dr. Alvy Bimler.  Notified Shaun in Patient Placement of bed request for early am.  Notified Dannielle Huh in managed care dept for pre cert.  Notified Charlene on [place] and notified Evelena Peat in Pharmacy.

## 2014-11-03 NOTE — Telephone Encounter (Signed)
pt called to get appt for MRI....i s.w. Peggy she is aware appt has not been sched....radiology will call pt

## 2014-11-03 NOTE — Patient Instructions (Signed)
PICC Home Guide A peripherally inserted central catheter (PICC) is a long, thin, flexible tube that is inserted into a vein in the upper arm. It is a form of intravenous (IV) access. It is considered to be a "central" line because the tip of the PICC ends in a large vein in your chest. This large vein is called the superior vena cava (SVC). The PICC tip ends in the SVC because there is a lot of blood flow in the SVC. This allows medicines and IV fluids to be quickly distributed throughout the body. The PICC is inserted using a sterile technique by a specially trained nurse or physician. After the PICC is inserted, a chest X-ray exam is done to be sure it is in the correct place.  A PICC may be placed for different reasons, such as:  To give medicines and liquid nutrition that can only be given through a central line. Examples are:  Certain antibiotic treatments.  Chemotherapy.  Total parenteral nutrition (TPN).  To take frequent blood samples.  To give IV fluids and blood products.  If there is difficulty placing a peripheral intravenous (PIV) catheter. If taken care of properly, a PICC can remain in place for several months. A PICC can also allow a person to go home from the hospital early. Medicine and PICC care can be managed at home by a family member or home health care team. WHAT PROBLEMS CAN HAPPEN WHEN I HAVE A PICC? Problems with a PICC can occasionally occur. These may include the following:  A blood clot (thrombus) forming in or at the tip of the PICC. This can cause the PICC to become clogged. A clot-dissolving medicine called tissue plasminogen activator (tPA) can be given through the PICC to help break up the clot.  Inflammation of the vein (phlebitis) in which the PICC is placed. Signs of inflammation may include redness, pain at the insertion site, red streaks, or being able to feel a "cord" in the vein where the PICC is located.  Infection in the PICC or at the insertion  site. Signs of infection may include fever, chills, redness, swelling, or pus drainage from the PICC insertion site.  PICC movement (malposition). The PICC tip may move from its original position due to excessive physical activity, forceful coughing, sneezing, or vomiting.  A break or cut in the PICC. It is important to not use scissors near the PICC.  Nerve or tendon irritation or injury during PICC insertion. WHAT SHOULD I KEEP IN MIND ABOUT ACTIVITIES WHEN I HAVE A PICC?  You may bend your arm and move it freely. If your PICC is near or at the bend of your elbow, avoid activity with repeated motion at the elbow.  Rest at home for the remainder of the day following PICC line insertion.  Avoid lifting heavy objects as instructed by your health care provider.  Avoid using a crutch with the arm on the same side as your PICC. You may need to use a walker. WHAT SHOULD I KNOW ABOUT MY PICC DRESSING?  Keep your PICC bandage (dressing) clean and dry to prevent infection.  Ask your health care provider when you may shower. Ask your health care provider to teach you how to wrap the PICC when you do take a shower.  Change the PICC dressing as instructed by your health care provider.  Change your PICC dressing if it becomes loose or wet. WHAT SHOULD I KNOW ABOUT PICC CARE?  Check the PICC insertion site   daily for leakage, redness, swelling, or pain.  Do not take a bath, swim, or use hot tubs when you have a PICC. Cover PICC line with clear plastic wrap and tape to keep it dry while showering.  Flush the PICC as directed by your health care provider. Let your health care provider know right away if the PICC is difficult to flush or does not flush. Do not use force to flush the PICC.  Do not use a syringe that is less than 10 mL to flush the PICC.  Never pull or tug on the PICC.  Avoid blood pressure checks on the arm with the PICC.  Keep your PICC identification card with you at all  times.  Do not take the PICC out yourself. Only a trained clinical professional should remove the PICC. SEEK IMMEDIATE MEDICAL CARE IF:  Your PICC is accidentally pulled all the way out. If this happens, cover the insertion site with a bandage or gauze dressing. Do not throw the PICC away. Your health care provider will need to inspect it.  Your PICC was tugged or pulled and has partially come out. Do not  push the PICC back in.  There is any type of drainage, redness, or swelling where the PICC enters the skin.  You cannot flush the PICC, it is difficult to flush, or the PICC leaks around the insertion site when it is flushed.  You hear a "flushing" sound when the PICC is flushed.  You have pain, discomfort, or numbness in your arm, shoulder, or jaw on the same side as the PICC.  You feel your heart "racing" or skipping beats.  You notice a hole or tear in the PICC.  You develop chills or a fever. MAKE SURE YOU:   Understand these instructions.  Will watch your condition.  Will get help right away if you are not doing well or get worse. Document Released: 05/07/2003 Document Revised: 03/17/2014 Document Reviewed: 07/08/2013 Exit[employer] Patient Information 2015 [employer]. This information is not intended to replace advice given to you by your health care provider. Make sure you discuss any questions you have with your health care provider.  

## 2014-11-03 NOTE — Assessment & Plan Note (Signed)
She tolerated cycle 3 very well. We will proceed with cycle 4, conventional way without dose adjustment. She has extensive stage IV disease with high-intermediate prognostic score (IPI=3).  She will proceed with treatment along with intrathecal methotrexate. She will need monthly Lupron injection to prevent menstruation and preserve fertility. She will be admitted to the hospital on 11/10/2014. I recommend intrathecal methotrexate under IR guidance on 11/11/2014. After discharge, she will return to the [facility] on a weekly basis for supportive care. On 11/17/2013, she will receive Procrit injection along with Neulasta.

## 2014-11-05 ENCOUNTER — Ambulatory Visit (HOSPITAL_BASED_OUTPATIENT_CLINIC_OR_DEPARTMENT_OTHER): Payer: Medicaid Other

## 2014-11-05 ENCOUNTER — Ambulatory Visit: Payer: Medicaid Other | Admitting: Lab

## 2014-11-05 DIAGNOSIS — Z452 Encounter for adjustment and management of vascular access device: Secondary | ICD-10-CM

## 2014-11-05 DIAGNOSIS — C8330 Diffuse large B-cell lymphoma, unspecified site: Secondary | ICD-10-CM

## 2014-11-05 LAB — PREGNANCY, URINE: Preg Test, Ur: NEGATIVE

## 2014-11-05 MED ORDER — HEPARIN SOD (PORK) LOCK FLUSH 100 UNIT/ML IV SOLN
500.0000 [IU] | Freq: Once | INTRAVENOUS | Status: AC
  Administered 2014-11-05: 250 [IU] via INTRAVENOUS
  Filled 2014-11-05: qty 5

## 2014-11-05 MED ORDER — SODIUM CHLORIDE 0.9 % IJ SOLN
10.0000 mL | INTRAMUSCULAR | Status: DC | PRN
  Administered 2014-11-05: 10 mL via INTRAVENOUS
  Filled 2014-11-05: qty 10

## 2014-11-05 NOTE — Patient Instructions (Signed)
PICC Home Guide A peripherally inserted central catheter (PICC) is a long, thin, flexible tube that is inserted into a vein in the upper arm. It is a form of intravenous (IV) access. It is considered to be a "central" line because the tip of the PICC ends in a large vein in your chest. This large vein is called the superior vena cava (SVC). The PICC tip ends in the SVC because there is a lot of blood flow in the SVC. This allows medicines and IV fluids to be quickly distributed throughout the body. The PICC is inserted using a sterile technique by a specially trained nurse or physician. After the PICC is inserted, a chest X-ray exam is done to be sure it is in the correct place.  A PICC may be placed for different reasons, such as:  To give medicines and liquid nutrition that can only be given through a central line. Examples are:  Certain antibiotic treatments.  Chemotherapy.  Total parenteral nutrition (TPN).  To take frequent blood samples.  To give IV fluids and blood products.  If there is difficulty placing a peripheral intravenous (PIV) catheter. If taken care of properly, a PICC can remain in place for several months. A PICC can also allow a person to go home from the hospital early. Medicine and PICC care can be managed at home by a family member or home health care team. WHAT PROBLEMS CAN HAPPEN WHEN I HAVE A PICC? Problems with a PICC can occasionally occur. These may include the following:  A blood clot (thrombus) forming in or at the tip of the PICC. This can cause the PICC to become clogged. A clot-dissolving medicine called tissue plasminogen activator (tPA) can be given through the PICC to help break up the clot.  Inflammation of the vein (phlebitis) in which the PICC is placed. Signs of inflammation may include redness, pain at the insertion site, red streaks, or being able to feel a "cord" in the vein where the PICC is located.  Infection in the PICC or at the insertion  site. Signs of infection may include fever, chills, redness, swelling, or pus drainage from the PICC insertion site.  PICC movement (malposition). The PICC tip may move from its original position due to excessive physical activity, forceful coughing, sneezing, or vomiting.  A break or cut in the PICC. It is important to not use scissors near the PICC.  Nerve or tendon irritation or injury during PICC insertion. WHAT SHOULD I KEEP IN MIND ABOUT ACTIVITIES WHEN I HAVE A PICC?  You may bend your arm and move it freely. If your PICC is near or at the bend of your elbow, avoid activity with repeated motion at the elbow.  Rest at home for the remainder of the day following PICC line insertion.  Avoid lifting heavy objects as instructed by your health care provider.  Avoid using a crutch with the arm on the same side as your PICC. You may need to use a walker. WHAT SHOULD I KNOW ABOUT MY PICC DRESSING?  Keep your PICC bandage (dressing) clean and dry to prevent infection.  Ask your health care provider when you may shower. Ask your health care provider to teach you how to wrap the PICC when you do take a shower.  Change the PICC dressing as instructed by your health care provider.  Change your PICC dressing if it becomes loose or wet. WHAT SHOULD I KNOW ABOUT PICC CARE?  Check the PICC insertion site   daily for leakage, redness, swelling, or pain.  Do not take a bath, swim, or use hot tubs when you have a PICC. Cover PICC line with clear plastic wrap and tape to keep it dry while showering.  Flush the PICC as directed by your health care provider. Let your health care provider know right away if the PICC is difficult to flush or does not flush. Do not use force to flush the PICC.  Do not use a syringe that is less than 10 mL to flush the PICC.  Never pull or tug on the PICC.  Avoid blood pressure checks on the arm with the PICC.  Keep your PICC identification card with you at all  times.  Do not take the PICC out yourself. Only a trained clinical professional should remove the PICC. SEEK IMMEDIATE MEDICAL CARE IF:  Your PICC is accidentally pulled all the way out. If this happens, cover the insertion site with a bandage or gauze dressing. Do not throw the PICC away. Your health care provider will need to inspect it.  Your PICC was tugged or pulled and has partially come out. Do not  push the PICC back in.  There is any type of drainage, redness, or swelling where the PICC enters the skin.  You cannot flush the PICC, it is difficult to flush, or the PICC leaks around the insertion site when it is flushed.  You hear a "flushing" sound when the PICC is flushed.  You have pain, discomfort, or numbness in your arm, shoulder, or jaw on the same side as the PICC.  You feel your heart "racing" or skipping beats.  You notice a hole or tear in the PICC.  You develop chills or a fever. MAKE SURE YOU:   Understand these instructions.  Will watch your condition.  Will get help right away if you are not doing well or get worse. Document Released: 05/07/2003 Document Revised: 03/17/2014 Document Reviewed: 07/08/2013 Exit[employer] Patient Information 2015 [employer]. This information is not intended to replace advice given to you by your health care provider. Make sure you discuss any questions you have with your health care provider.  

## 2014-11-08 ENCOUNTER — Ambulatory Visit (HOSPITAL_BASED_OUTPATIENT_CLINIC_OR_DEPARTMENT_OTHER): Payer: Medicaid Other

## 2014-11-08 VITALS — BP 120/67 | HR 87 | Temp 98.3°F | Resp 16

## 2014-11-08 DIAGNOSIS — C8330 Diffuse large B-cell lymphoma, unspecified site: Secondary | ICD-10-CM

## 2014-11-08 DIAGNOSIS — Z452 Encounter for adjustment and management of vascular access device: Secondary | ICD-10-CM

## 2014-11-08 MED ORDER — HEPARIN SOD (PORK) LOCK FLUSH 100 UNIT/ML IV SOLN
500.0000 [IU] | Freq: Once | INTRAVENOUS | Status: AC
  Administered 2014-11-08: 250 [IU] via INTRAVENOUS
  Filled 2014-11-08: qty 5

## 2014-11-08 MED ORDER — SODIUM CHLORIDE 0.9 % IJ SOLN
10.0000 mL | INTRAMUSCULAR | Status: DC | PRN
  Administered 2014-11-08: 10 mL via INTRAVENOUS
  Filled 2014-11-08: qty 10

## 2014-11-10 ENCOUNTER — Other Ambulatory Visit: Payer: Self-pay | Admitting: Hematology and Oncology

## 2014-11-10 ENCOUNTER — Encounter (HOSPITAL_COMMUNITY): Payer: Self-pay

## 2014-11-10 ENCOUNTER — Inpatient Hospital Stay (HOSPITAL_COMMUNITY)
Admission: RE | Admit: 2014-11-10 | Discharge: 2014-11-14 | DRG: 847 | Disposition: A | Discharge status: Home / Self Care | Payer: Medicaid Other | Source: Ambulatory Visit | Attending: Hematology and Oncology | Admitting: Hematology and Oncology

## 2014-11-10 DIAGNOSIS — G4700 Insomnia, unspecified: Secondary | ICD-10-CM | POA: Diagnosis present

## 2014-11-10 DIAGNOSIS — T458X5A Adverse effect of other primarily systemic and hematological agents, initial encounter: Secondary | ICD-10-CM | POA: Diagnosis present

## 2014-11-10 DIAGNOSIS — C8330 Diffuse large B-cell lymphoma, unspecified site: Secondary | ICD-10-CM | POA: Diagnosis present

## 2014-11-10 DIAGNOSIS — Z5111 Encounter for antineoplastic chemotherapy: Secondary | ICD-10-CM | POA: Diagnosis present

## 2014-11-10 DIAGNOSIS — Z91048 Other nonmedicinal substance allergy status: Secondary | ICD-10-CM

## 2014-11-10 DIAGNOSIS — Z789 Other specified health status: Secondary | ICD-10-CM | POA: Diagnosis present

## 2014-11-10 DIAGNOSIS — Z8572 Personal history of non-Hodgkin lymphomas: Secondary | ICD-10-CM | POA: Diagnosis present

## 2014-11-10 DIAGNOSIS — D72829 Elevated white blood cell count, unspecified: Secondary | ICD-10-CM | POA: Diagnosis present

## 2014-11-10 DIAGNOSIS — IMO0001 Reserved for inherently not codable concepts without codable children: Secondary | ICD-10-CM | POA: Diagnosis present

## 2014-11-10 MED ORDER — ALBUTEROL SULFATE (2.5 MG/3ML) 0.083% IN NEBU: 2.5000 mg | INHALATION_SOLUTION | Freq: Once | RESPIRATORY_TRACT | Status: AC | PRN

## 2014-11-10 MED ORDER — EPINEPHRINE HCL 1 MG/ML IJ SOLN: 0.5000 mg | Freq: Once | INTRAMUSCULAR | Status: AC | PRN

## 2014-11-10 MED ORDER — SODIUM CHLORIDE 0.9 % IJ SOLN: 10.0000 mL | INTRAMUSCULAR | Status: DC | PRN

## 2014-11-10 MED ORDER — HEPARIN SOD (PORK) LOCK FLUSH 100 UNIT/ML IV SOLN: 250.0000 [IU] | Freq: Once | INTRAVENOUS | Status: AC | PRN

## 2014-11-10 MED ORDER — SODIUM CHLORIDE 0.9 % IV SOLN: Freq: Once | INTRAVENOUS | Status: AC | PRN

## 2014-11-10 MED ORDER — SODIUM CHLORIDE 0.9 % IV SOLN
INTRAVENOUS | Status: AC
  Administered 2014-11-10 – 2014-11-11 (×2): 8 mg via INTRAVENOUS
  Administered 2014-11-12: 18 mg via INTRAVENOUS
  Administered 2014-11-13: 8 mg via INTRAVENOUS
  Filled 2014-11-10 (×4): qty 4

## 2014-11-10 MED ORDER — VINCRISTINE SULFATE CHEMO INJECTION 1 MG/ML
INTRAVENOUS | Status: AC
  Administered 2014-11-10 – 2014-11-13 (×4): via INTRAVENOUS
  Filled 2014-11-10 (×4): qty 10

## 2014-11-10 MED ORDER — SODIUM CHLORIDE 0.9 % IV SOLN: Freq: Once | INTRAVENOUS | Status: DC

## 2014-11-10 MED ORDER — HEPARIN SOD (PORK) LOCK FLUSH 100 UNIT/ML IV SOLN: 500.0000 [IU] | Freq: Once | INTRAVENOUS | Status: AC | PRN

## 2014-11-10 MED ORDER — DIPHENHYDRAMINE HCL 50 MG PO CAPS
50.0000 mg | ORAL_CAPSULE | Freq: Once | ORAL | Status: AC
  Administered 2014-11-10: 50 mg via ORAL
  Filled 2014-11-10: qty 1

## 2014-11-10 MED ORDER — ENOXAPARIN SODIUM 40 MG/0.4ML ~~LOC~~ SOLN
40.0000 mg | SUBCUTANEOUS | Status: DC
  Filled 2014-11-10 (×5): qty 0.4

## 2014-11-10 MED ORDER — HOT PACK MISC ONCOLOGY
1.0000 | Freq: Once | Status: AC | PRN
  Filled 2014-11-10: qty 1

## 2014-11-10 MED ORDER — DIPHENHYDRAMINE HCL 50 MG/ML IJ SOLN: 50.0000 mg | Freq: Once | INTRAMUSCULAR | Status: AC | PRN

## 2014-11-10 MED ORDER — SODIUM CHLORIDE 0.9 % IV SOLN
INTRAVENOUS | Status: DC
  Administered 2014-11-10: 10:00:00 via INTRAVENOUS

## 2014-11-10 MED ORDER — FAMOTIDINE IN NACL 20-0.9 MG/50ML-% IV SOLN: 20.0000 mg | Freq: Once | INTRAVENOUS | Status: AC | PRN

## 2014-11-10 MED ORDER — COLD PACK MISC ONCOLOGY
1.0000 | Freq: Once | Status: AC | PRN
  Filled 2014-11-10: qty 1

## 2014-11-10 MED ORDER — LEUPROLIDE ACETATE 7.5 MG IM KIT
7.5000 mg | PACK | Freq: Once | INTRAMUSCULAR | Status: AC
  Administered 2014-11-11: 7.5 mg via INTRAMUSCULAR
  Filled 2014-11-10: qty 7.5

## 2014-11-10 MED ORDER — DIPHENHYDRAMINE HCL 50 MG/ML IJ SOLN: 25.0000 mg | Freq: Once | INTRAMUSCULAR | Status: AC | PRN

## 2014-11-10 MED ORDER — SODIUM CHLORIDE 0.9 % IJ SOLN: 3.0000 mL | INTRAMUSCULAR | Status: DC | PRN

## 2014-11-10 MED ORDER — EPINEPHRINE HCL 0.1 MG/ML IJ SOSY: 0.2500 mg | PREFILLED_SYRINGE | Freq: Once | INTRAMUSCULAR | Status: AC | PRN

## 2014-11-10 MED ORDER — ALTEPLASE 2 MG IJ SOLR: 2.0000 mg | Freq: Once | INTRAMUSCULAR | Status: AC | PRN

## 2014-11-10 MED ORDER — ACETAMINOPHEN 325 MG PO TABS
650.0000 mg | ORAL_TABLET | Freq: Once | ORAL | Status: AC
  Administered 2014-11-10: 650 mg via ORAL
  Filled 2014-11-10: qty 2

## 2014-11-10 MED ORDER — SODIUM CHLORIDE 0.9 % IV SOLN
375.0000 mg/m2 | Freq: Once | INTRAVENOUS | Status: AC
  Administered 2014-11-10: 800 mg via INTRAVENOUS
  Filled 2014-11-10: qty 80

## 2014-11-10 MED ORDER — METHYLPREDNISOLONE SODIUM SUCC 125 MG IJ SOLR: 125.0000 mg | Freq: Once | INTRAMUSCULAR | Status: AC | PRN

## 2014-11-10 NOTE — Progress Notes (Signed)
rituxan started, dose checked as per BSA  Recommendation. Checked with Aurelio Jew RN.

## 2014-11-10 NOTE — H&P (Signed)
[place]  [phone]    ADMISSION NOTE I have seen the patient, examined her and edited the notes as follows  Admitting MD:  Heath Lark, MD  Attending MD:  Heath Lark, MD  Reason for Admission: Cycle 4 of Chemotherapy with R-EPOCH for the treatment of Diffuse, large B Cell Lymphoma  HPI: Lindsey Irwin is an 18 y.o. female with a history of Diffuse large B Cell Lymphoma, admitted to the hospital for the initiation of Cycle 4 of Chemotherapy with R-EPOCH. She feels well. Afebrile. She denies chills or night sweats. Denies shortness of breath or chest pain. Denies nausea, vomiting, diarrhea or mucositis. Appetite is normal. Denies constipation,last bowel movement today. Denies any headaches, vision changes or dizziness. Denies any rashes. No bleeding issues. Ambulating frequently. No confusion reported.   Oncology History   Diffuse large B cell lymphoma  Primary site: Lymphoid Neoplasms  Staging method: AJCC 6th Edition  Clinical: Stage IV signed by Heath Lark, MD on 09/03/2014 8:18 PM  Summary: Stage IV IPI of 3: high LDH, >2 extranodal disease and stage IV at presentation       Diffuse large B cell lymphoma   07/30/2014 Imaging She had a bilateral mammogram which came back abnormal. Ultrasound-guided biopsy was performed, suspicious for lymphoma   08/25/2014 Imaging PET/CT scan showed diffuse metastatic cancer involving bilateral breasts, bone, and large abdominal mass   08/28/2014 Surgery She had excisional surgery and removal of right breast mass   08/28/2014 Pathology Results Accession: [id number] right breast mass is consistent with diffuse large B-cell lymphoma with high proliferation index.   09/04/2014 - 09/11/2014 Hospital Admission she is admitted to the hospital for cycle 1 of [place]   09/04/2014 Imaging echocardiogram is normal.   09/04/2014 Procedure she has placement of PICC line.   09/29/14  Hospital admission The patient was admitted to the hospital for cycle 2 of [place]   10/14/2014 Imaging PET-CT scan showed near-complete response to treatment   10/20/2014 Hospital admission The patient was admitted to the hospital for cycle 3 of  [place]   11/10/2014 Hospital admission The patient was admitted to the hospital for cycle 4 of  [place]     PMH:  Past Medical History  Diagnosis Date  . Breast mass 08/2014  . Sore throat 08/25/2014  . Cough 08/2014  . Nasal congestion 08/2014  . Diffuse large B cell lymphoma 09/02/2014  . Insomnia 09/24/2014    Surgeries:  Past Surgical History  Procedure Laterality Date  . No past surgeries    . Breast biopsy Right 08/28/2014    Procedure: RIGHT EXCISIONAL BREAST BIOPSY;  Surgeon: Donnie Mesa, MD;  Location: [facility];  Service: General;  Laterality: Right;    Allergies:  Allergies  Allergen Reactions  . Other     NO BLOOD PRODUCTS. PT IS A JEHOVAH WITNESS.  Marland Kitchen Soap Itching    GAIN LAUNDRY DETERGENT Allergic to tide and surf also    Medications:   Prior to Admission:  Prescriptions prior to admission  Medication Sig Dispense Refill Last Dose  . HYDROcodone-acetaminophen (NORCO/VICODIN) 5-325 MG per tablet Take 1 tablet by mouth every 4 (four) hours as needed. (Patient not taking: Reported on 11/03/2014) 40 tablet 0 Not Taking  . ondansetron (ZOFRAN) 8 MG tablet Take 1 tablet (8 mg total) by mouth every 8 (eight) hours as needed for nausea. (Patient not taking: Reported on 11/03/2014) 30 tablet 3 Not Taking  . prochlorperazine (COMPAZINE) 10 MG  tablet Take 1 tablet (10 mg total) by mouth every 6 (six) hours as needed for nausea. (Patient not taking: Reported on 11/03/2014) 30 tablet 3 Not Taking  . zolpidem (AMBIEN) 10 MG tablet Take 1 tablet (10 mg total) by mouth at bedtime as needed for sleep. (Patient not taking: Reported on 11/03/2014) 30 tablet 0 Not Taking    Review of  Systems  Constitutional: Denies fevers, chills or abnormal weight loss Eyes: Denies blurriness of vision Ears nose,mouth, throat, and face: Denies mucositis or sore throat Respiratory: Denies cough, dyspnea or wheezes Cardiovascular: Denies palpitation, chest discomfort or lower extremity swelling Gastrointestinal: Denies nausea, heartburn or change in bowel habits Skin: Denies abnormal skin rashes. Right Upper extremity PICC line non tender, without erythema. Lymphatics: Denies new lymphadenopathy or easy bruising Neurological:Denies numbness, tingling or new weaknesses Behavioral/Psych: Mood is stable, no new changes  All other systems were reviewed with the patient and are negative  Family History:  No family history on file.  Social History:  reports that she has never smoked. She has never used smokeless tobacco. She reports that she does not drink alcohol or use illicit drugs.  Physical Exam:   There were no vitals filed for this visit.  18 y.o. female  in no acute distress, alert, conversant.  GENERAL:alert, no distress and comfortable SKIN: skin color, texture, turgor are normal, no rashes or significant lesions EYES: normal, Conjunctiva are pink and non-injected, sclera clear OROPHARYNX:no exudate, no erythema and lips, buccal mucosa, and tongue normal  NECK: supple, thyroid normal size, non-tender, without nodularity LYMPH: no palpable lymphadenopathy in the cervical, axillary or inguinal LUNGS: clear to auscultation and percussion with normal breathing effort HEART: regular rate & rhythm and no murmurs and no lower extremity edema ABDOMEN:abdomen soft, non-tender and normal bowel sounds Musculoskeletal:no cyanosis of digits and no clubbing  NEURO: alert & oriented x 3 with fluent speech, no focal motor/sensory deficits  LABS: CBC   Recent Labs Lab 11/03/14 1100  WBC 21.5*  HGB 11.8  HCT 37.7  PLT 304  MCV 84.3  MCH 26.5  MCHC 31.4*  RDW 18.4*    LYMPHSABS 2.1  MONOABS 1.1*  EOSABS 0.2  BASOSABS 0.1     CMP    Recent Labs Lab 11/03/14 1100  NA 142  K 4.0  CO2 26  GLUCOSE 94  BUN 8.7  CREATININE 0.8  CALCIUM 9.5  AST 11  ALT 17  ALKPHOS 156*  BILITOT <0.20        Component Value Date/Time   BILITOT <0.20 11/03/2014 1100   BILITOT <0.2* 10/01/2014 0545   A/P: 18 y.o. female   Diffuse large B cell lymphoma She had extensive stage IV disease with high-intermediate prognostic score (IPI=3).  The patient had tolerated cycle 3 very well. Will proceed with cycle 4 of infusional R-EPOCH day 1 cycle 3 on 11/10/2014 from day 1-5 and every 21 days, conventional way without dose adjustment She will receive prophylactic CNS treatment with intrathecal methotrexate under interventional radiology guidance on 11/11/2014 She will need Lupron injection to prevent menstruation and preserve fertility next week On 11/17/14 she will receive Procrit injection along with Neulasta.  Jehova's witness Due to her religious beliefs, she will not receive blood products No signs of anemia noticed at this time She received Procrit injection on 12/21 as preventive strategy against the need for transfusion.  DVT prophylaxis She will be on Lovenox start tomorrow after LP and IT chemo, and this can be discontinued if  her platelet count drops to less than 50,000  Leukocytosis Due to recent Neulasta on 10/27/14, in the setting of lymphoma.  She is afebrile No intervention indicated at this time  Full Code  [facility] 11/10/2014 9:06 AM  Ayen Viviano, MD 11/10/2014

## 2014-11-10 NOTE — Progress Notes (Signed)
BSA and chemo dosages calculated with Reyne Dumas.

## 2014-11-10 NOTE — Progress Notes (Signed)
Chemo given, patient tolerated it well, PICC line remained with very good blood return.

## 2014-11-11 ENCOUNTER — Telehealth: Payer: Self-pay | Admitting: Hematology and Oncology

## 2014-11-11 ENCOUNTER — Inpatient Hospital Stay (HOSPITAL_COMMUNITY): Payer: Medicaid Other

## 2014-11-11 ENCOUNTER — Telehealth: Payer: Self-pay | Admitting: *Deleted

## 2014-11-11 LAB — CBC
HCT: 40.2 % (ref 36.0–46.0)
Hemoglobin: 12.7 g/dL (ref 12.0–15.0)
MCH: 27 pg (ref 26.0–34.0)
MCHC: 31.6 g/dL (ref 30.0–36.0)
MCV: 85.4 fL (ref 78.0–100.0)
PLATELETS: 336 10*3/uL (ref 150–400)
RBC: 4.71 MIL/uL (ref 3.87–5.11)
RDW: 17 % — ABNORMAL HIGH (ref 11.5–15.5)
WBC: 23.4 10*3/uL — ABNORMAL HIGH (ref 4.0–10.5)

## 2014-11-11 LAB — CREATININE, SERUM
CREATININE: 0.62 mg/dL (ref 0.50–1.10)
GFR calc Af Amer: >90 mL/min (ref >90)

## 2014-11-11 MED ORDER — SODIUM CHLORIDE 0.9 % IJ SOLN
Freq: Once | INTRAMUSCULAR | Status: AC
  Administered 2014-11-11: 13:00:00 via INTRATHECAL
  Filled 2014-11-11: qty 0.48

## 2014-11-11 NOTE — Progress Notes (Signed)
Lindsey Irwin   DOB:Jan 16, 1996   BP#:[id number]   OEU#:[id number]  Patient Care Team: No Pcp Per Patient as PCP - General (General Practice) I have seen the patient, examined her and edited the notes as follows I have seen the patient, examined her and edited the notes as follows  Subjective: Patient seen and examined. Tolerated C4 Day 1 of Chemo well. Afebrile. Denies fever, chills or night sweats. Denies shortness of breath or chest pain. Denies nausea, vomiting, diarrhea or mucositis. Appetite is normal. Last bowel movement on 12/28. Denies any headaches, vision changes or dizziness. Denies any rashes. No bleeding issues. Ambulating frequently. No confusion reported.  Scheduled Meds: . sodium chloride   Intravenous Once  . DOXOrubicin/vinCRIStine/etoposide CHEMO IV infusion for Inpatient CI   Intravenous Q24H  . enoxaparin (LOVENOX) injection  40 mg Subcutaneous Q24H  . leuprolide  7.5 mg Intramuscular Once  . ondansetron (ZOFRAN) with dexamethasone (DECADRON) IV   Intravenous Q24H   Continuous Infusions: . sodium chloride 20 mL/hr at 11/10/14 1027   PRN Meds:.sodium chloride, sodium chloride, sodium chloride, sodium chloride   Objective:  Filed Vitals:   11/11/14 0545  BP: 116/68  Pulse: 58  Temp: 97.8 F (36.6 C)  Resp: 18      Intake/Output Summary (Last 24 hours) at 11/11/14 1540 Last data filed at 11/11/14 0545  Gross per 24 hour  Intake    620 ml  Output   1400 ml  Net   -780 ml    ECOG PERFORMANCE STATUS:0  GENERAL:alert, no distress and comfortable SKIN: skin color, texture, turgor are normal, no rashes or significant lesions EYES: normal, conjunctiva are pink and non-injected, sclera clear OROPHARYNX:no exudate, no erythema and lips, buccal mucosa, and tongue normal  NECK: supple, thyroid normal size, non-tender, without nodularity LYMPH:  no palpable lymphadenopathy in the cervical, axillary or inguinal LUNGS: clear to auscultation and percussion with normal  breathing effort HEART: regular rate & rhythm and no murmurs and no lower extremity edema. Right PICC line normal ABDOMEN: soft, non-tender and normal bowel sounds Musculoskeletal:no cyanosis of digits and no clubbing  PSYCH: alert & oriented x 3 with fluent speech NEURO: no focal motor/sensory deficits    CBG (last 3)  No results for input(s): GLUCAP in the last 72 hours.   Labs:   Recent Labs Lab 11/11/14 0545  WBC 23.4*  HGB 12.7  HCT 40.2  PLT 336  MCV 85.4  MCH 27.0  MCHC 31.6  RDW 17.0*     Chemistries:    Recent Labs Lab 11/11/14 0545  CREATININE 0.62    GFR Estimated Creatinine Clearance: 135.3 mL/min (by C-G formula based on Cr of 0.62).  Liver Function Tests: No results for input(s): AST, ALT, ALKPHOS, BILITOT, PROT, ALBUMIN in the last 168 hours.   Imaging Studies:  No results found.  Assessment/Plan: 18 y.o.  Diffuse large B cell lymphoma She had extensive stage IV disease with high- intermediate prognostic score (IPI=3). She was admitted for infusional R-EPOCH day 1 cycle 3 on 11/10/2014 from day 1-5 and every 21 days, conventional way, without dose adjustment. She tolerated Rituxan well without toxic side effects. She will receive prophylactic CNS treatment with intrathecal methotrexate under interventional radiology guidance today,11/11/2014 She will need Lupron injection to prevent menstruation and preserve fertility today, 11/11/14 On 11/17/14 she will receive Procrit injection along with Neulasta.  Jehovah's witness Due to her religious beliefs, she will not receive blood products No signs of anemia noticed at this time She  received Procrit injection on 12/21 as preventive strategy against the need for transfusion.  DVT prophylaxis She will be on Lovenox start today after LP and IT chemo, and this can be discontinued if her platelet count drops to less than 50,000  Leukocytosis Due to recent Neulasta on 10/27/18, in the setting of  lymphoma.  She is afebrile No intervention indicated at this time  Full Code   Rondel Jumbo, PA-C 11/11/2014  7:12 AM Shravan Salahuddin, MD 11/11/2014

## 2014-11-11 NOTE — Telephone Encounter (Signed)
lvm for pt regarding to added appts.Marland KitchenMarland KitchenMarland Kitchen

## 2014-11-12 ENCOUNTER — Other Ambulatory Visit: Payer: Self-pay | Admitting: Hematology and Oncology

## 2014-11-12 NOTE — Progress Notes (Signed)
Chemotherapy dosing verified by manual calculation - BSA 2.16, Doxorubicin 20mg  (9.3mg /m2), Etoposide 102mg  (47mg /m2), and Vincristine 0.8mg  (0.4mg /m2).

## 2014-11-12 NOTE — Progress Notes (Signed)
Lindsey Irwin   DOB:[birth year]   AT#:[id number]   KYH#:[id number]  Patient Care Team: No Pcp Per Patient as PCP - General (General Practice)  I have seen the patient, examined her and edited the notes as follows  Subjective: Patient seen and examined. Tolerated C4 Day 2 of Chemo well. Afebrile. Denies fever, chills or night sweats. Denies shortness of breath or chest pain. Denies nausea, vomiting, diarrhea or mucositis. Appetite is normal. Last bowel movement on 12/28. Denies any headaches, vision changes or dizziness. Denies any rashes. No bleeding issues. Ambulating frequently. No confusion reported.  Scheduled Meds: . sodium chloride   Intravenous Once  . DOXOrubicin/vinCRIStine/etoposide CHEMO IV infusion for Inpatient CI   Intravenous Q24H  . enoxaparin (LOVENOX) injection  40 mg Subcutaneous Q24H  . ondansetron (ZOFRAN) with dexamethasone (DECADRON) IV   Intravenous Q24H   Continuous Infusions: . sodium chloride 20 mL/hr at 11/10/14 1027   PRN Meds:.sodium chloride, sodium chloride, sodium chloride, sodium chloride   Objective:  Filed Vitals:   11/12/14 0527  BP: 95/45  Pulse: 63  Temp: 97.3 F (36.3 C)  Resp: 16      Intake/Output Summary (Last 24 hours) at 11/12/14 1517 Last data filed at 11/12/14 6160  Gross per 24 hour  Intake    240 ml  Output   2300 ml  Net  -2060 ml    ECOG PERFORMANCE STATUS:0  GENERAL:alert, no distress and comfortable SKIN: skin color, texture, turgor are normal, no rashes or significant lesions EYES: normal, conjunctiva are pink and non-injected, sclera clear OROPHARYNX:no exudate, no erythema and lips, buccal mucosa, and tongue normal  NECK: supple, thyroid normal size, non-tender, without nodularity LYMPH:  no palpable lymphadenopathy in the cervical, axillary or inguinal LUNGS: clear to auscultation and percussion with normal breathing effort HEART: regular rate & rhythm and no murmurs and no lower extremity edema. Right PICC line  normal ABDOMEN: soft, non-tender and normal bowel sounds Musculoskeletal:no cyanosis of digits and no clubbing  PSYCH: alert & oriented x 3 with fluent speech NEURO: no focal motor/sensory deficits    CBG (last 3)  No results for input(s): GLUCAP in the last 72 hours.   Labs:   Recent Labs Lab 11/11/14 0545  WBC 23.4*  HGB 12.7  HCT 40.2  PLT 336  MCV 85.4  MCH 27.0  MCHC 31.6  RDW 17.0*     Chemistries:    Recent Labs Lab 11/11/14 0545  CREATININE 0.62    GFR Estimated Creatinine Clearance: 135.3 mL/min (by C-G formula based on Cr of 0.62).  Liver Function Tests: No results for input(s): AST, ALT, ALKPHOS, BILITOT, PROT, ALBUMIN in the last 168 hours.   Imaging Studies:  Dg Fluoro Guide Spinal/si Jt Inj Left  11/11/2014   CLINICAL DATA:  Diffuse large B-cell lymphoma  EXAM: FLUOROSCOPICALLY GUIDED LUMBAR PUNCTURE FOR INTRATHECAL  CHEMOTHERAPY  TECHNIQUE: Informed consent was obtained from the patient prior to the procedure, including potential complications of headache, allergy, and pain. A 'time out' was performed. With the patient prone, the lower back was prepped with Betadine. 1% Lidocaine was used for local anesthesia. Lumbar puncture was performed at the L4-L5 level using a 22 gauge needle with return of clear CSF. 12 mg of methotrexate was injected into the subarachnoid space. The patient tolerated the procedure well without apparent complication.  FLUOROSCOPY TIME:  18 seconds  IMPRESSION: Intrathecal injection of chemotherapy without complication   Electronically Signed   By: Lovey Newcomer M.D.   On: 11/11/2014  15:11    Assessment/Plan: 18 y.o.  Diffuse large B cell lymphoma She had extensive stage IV disease with high- intermediate prognostic score (IPI=3). She was admitted for infusional R-EPOCH day 1 cycle 4 on 11/10/2014 from day 1-5 and every 21 days, conventional way, without dose adjustment. She tolerated Rituxan well without toxic side  effects. She received prophylactic CNS treatment with intrathecal methotrexate under interventional radiology on 11/11/2014 She received Lupron injection to prevent menstruation and preserve fertility on 11/11/14 On 11/17/14 she will receive Procrit injection along with Neulasta.  Jehovah's witness Due to her religious beliefs, she will not receive blood products No signs of anemia noticed at this time She received Procrit injection on 12/21 as preventive strategy against the need for transfusion.  DVT prophylaxis She was started on  Lovenox after LP and IT chemo, and this can be discontinued if her platelet count drops to less than 50,000  Leukocytosis Due to recent Neulasta on 10/28/15, in the setting of lymphoma.  She is afebrile No intervention indicated at this time  Discharge planning Anticipated discharge on Friday 11/14/14 after chemo completion and if patient is clinically stable  Full Code   Rondel Jumbo, PA-C 11/12/2014  7:09 AM Esley Brooking, MD 11/12/2014

## 2014-11-13 NOTE — Progress Notes (Signed)
Lindsey Irwin   DOB:2019/01/13   LK#:[id number]   DGU#:[id number]  Patient Care Team: No Pcp Per Patient as PCP - General (General Practice)  I have seen the patient, examined her and edited the notes as follows  Subjective: Patient seen and examined. Tolerated C4 Day 3 of Chemo well. Afebrile. Denies fever, chills or night sweats. Denies shortness of breath or chest pain. Denies nausea, vomiting, diarrhea or mucositis. Appetite is normal. Last bowel movement on 12/30. Denies any headaches, vision changes or dizziness. Denies any rashes. No bleeding issues. Ambulating frequently. No confusion reported.  Scheduled Meds: . sodium chloride   Intravenous Once  . DOXOrubicin/vinCRIStine/etoposide CHEMO IV infusion for Inpatient CI   Intravenous Q24H  . enoxaparin (LOVENOX) injection  40 mg Subcutaneous Q24H  . ondansetron (ZOFRAN) with dexamethasone (DECADRON) IV   Intravenous Q24H   Continuous Infusions: . sodium chloride 20 mL/hr at 11/10/14 1027   PRN Meds:.sodium chloride, sodium chloride, sodium chloride, sodium chloride   Objective:  Filed Vitals:   11/13/14 0504  BP: 100/50  Pulse: 58  Temp: 97.5 F (36.4 C)  Resp: 16      Intake/Output Summary (Last 24 hours) at 11/13/14 9563 Last data filed at 11/13/14 0505  Gross per 24 hour  Intake      0 ml  Output   1250 ml  Net  -1250 ml    ECOG PERFORMANCE STATUS:0  GENERAL:alert, no distress and comfortable SKIN: skin color, texture, turgor are normal, no rashes or significant lesions EYES: normal, conjunctiva are pink and non-injected, sclera clear OROPHARYNX:no exudate, no erythema and lips, buccal mucosa, and tongue normal  NECK: supple, thyroid normal size, non-tender, without nodularity LYMPH:  no palpable lymphadenopathy in the cervical, axillary or inguinal LUNGS: clear to auscultation and percussion with normal breathing effort HEART: regular rate & rhythm and no murmurs and no lower extremity edema. Right PICC line  normal ABDOMEN: soft, non-tender and normal bowel sounds Musculoskeletal:no cyanosis of digits and no clubbing  PSYCH: alert & oriented x 3 with fluent speech NEURO: no focal motor/sensory deficits    CBG (last 3)  No results for input(s): GLUCAP in the last 72 hours.   Labs:   Recent Labs Lab 11/11/14 0545  WBC 23.4*  HGB 12.7  HCT 40.2  PLT 336  MCV 85.4  MCH 27.0  MCHC 31.6  RDW 17.0*     Chemistries:    Recent Labs Lab 11/11/14 0545  CREATININE 0.62    GFR Estimated Creatinine Clearance: 135.3 mL/min (by C-G formula based on Cr of 0.62).  Liver Function Tests: No results for input(s): AST, ALT, ALKPHOS, BILITOT, PROT, ALBUMIN in the last 168 hours.   Imaging Studies:  Dg Fluoro Guide Spinal/si Jt Inj Left  11/11/2014   CLINICAL DATA:  Diffuse large B-cell lymphoma  EXAM: FLUOROSCOPICALLY GUIDED LUMBAR PUNCTURE FOR INTRATHECAL  CHEMOTHERAPY  TECHNIQUE: Informed consent was obtained from the patient prior to the procedure, including potential complications of headache, allergy, and pain. A 'time out' was performed. With the patient prone, the lower back was prepped with Betadine. 1% Lidocaine was used for local anesthesia. Lumbar puncture was performed at the L4-L5 level using a 22 gauge needle with return of clear CSF. 12 mg of methotrexate was injected into the subarachnoid space. The patient tolerated the procedure well without apparent complication.  FLUOROSCOPY TIME:  18 seconds  IMPRESSION: Intrathecal injection of chemotherapy without complication   Electronically Signed   By: Polly Cobia.D.  On: 11/11/2014 15:11    Assessment/Plan: 18 y.o.  Diffuse large B cell lymphoma She had extensive stage IV disease with high- intermediate prognostic score (IPI=3). She was admitted for infusional R-EPOCH day 1 cycle 4 on 11/10/2014 from day 1-5 and every 21 days, conventional way, without dose adjustment. She tolerated Rituxan well without toxic side  effects. She received prophylactic CNS treatment with intrathecal methotrexate under interventional radiology on 11/11/2014 She received Lupron injection to prevent menstruation and preserve fertility on 11/11/14 On 11/17/14 she will receive Procrit injection along with Neulasta.  Jehovah's witness Due to her religious beliefs, she will not receive blood products No signs of anemia noticed at this time She received Procrit injection on 12/21 as preventive strategy against the need for transfusion.  DVT prophylaxis She was started on Lovenox after LP and IT chemo, and this can be discontinued if her platelet count drops to less than 50,000  Leukocytosis Due to recent Neulasta on 10/27/18, in the setting of lymphoma.  She is afebrile No intervention indicated at this time  Discharge planning Anticipated discharge on Friday 11/14/14 after chemo completion and if patient is clinically stable  Full Code   Rondel Jumbo, PA-C 11/13/2014  7:42 AM Lindsey Lux, MD 11/13/2014

## 2014-11-13 NOTE — Progress Notes (Signed)
Chemo dosages/calcuations/dilution verified with Aldean Baker, RN.

## 2014-11-14 DIAGNOSIS — Z5112 Encounter for antineoplastic immunotherapy: Secondary | ICD-10-CM

## 2014-11-14 MED ORDER — SODIUM CHLORIDE 0.9 % IV SOLN
Freq: Once | INTRAVENOUS | Status: AC
  Administered 2014-11-14: 16 mg via INTRAVENOUS
  Filled 2014-11-14 (×2): qty 8

## 2014-11-14 MED ORDER — HEPARIN SOD (PORK) LOCK FLUSH 100 UNIT/ML IV SOLN
250.0000 [IU] | INTRAVENOUS | Status: AC | PRN
Start: 2014-11-14 — End: 2014-11-14
  Administered 2014-11-14: 250 [IU]

## 2014-11-14 MED ORDER — CYCLOPHOSPHAMIDE CHEMO INJECTION 1 GM
750.0000 mg/m2 | Freq: Once | INTRAMUSCULAR | Status: AC
  Administered 2014-11-14: 1520 mg via INTRAVENOUS
  Filled 2014-11-14 (×2): qty 76

## 2014-11-14 NOTE — Progress Notes (Signed)
Patient completed chemotherapy without any problems.  Discharge instructions reviewed with patient using teach back method and she demonstrated understanding.  Patient stable for discharge.  Assessment unchanged from this am.  Lindsey Irwin [facility]  11/14/2014

## 2014-11-14 NOTE — Plan of Care (Signed)
Problem: Discharge Progression Outcomes Goal: Barriers To Progression Addressed/Resolved Outcome: Not Applicable Date Met:  37/79/39 No barriers identified. Goal: Pain controlled with appropriate interventions Outcome: Not Applicable Date Met:  68/86/48 No complaints of pain.

## 2014-11-14 NOTE — Progress Notes (Signed)
Cytoxan dosage calculated and verified with Kirkland Hun, RN.

## 2014-11-14 NOTE — Discharge Summary (Signed)
Physician Discharge Summary  Patient ID: Lindsey Irwin MRN: [id number] DOB/AGE: [birth year] 20 y.o.  Admit date: 11/10/2014 Discharge date: 11/14/2014  Primary Care Physician:  No PCP Per Patient   Discharge Diagnoses:    Present on Admission:  . Diffuse large B cell lymphoma . Patient is Je[employer]'s Witness . DLBCL (diffuse large B cell lymphoma)  Discharge Medications:    Medication List    STOP taking these medications        HYDROcodone-acetaminophen 5-325 MG per tablet  Commonly known as:  NORCO/VICODIN      TAKE these medications        ondansetron 8 MG tablet  Commonly known as:  ZOFRAN  Take 1 tablet (8 mg total) by mouth every 8 (eight) hours as needed for nausea.     prochlorperazine 10 MG tablet  Commonly known as:  COMPAZINE  Take 1 tablet (10 mg total) by mouth every 6 (six) hours as needed for nausea.     zolpidem 10 MG tablet  Commonly known as:  AMBIEN  Take 1 tablet (10 mg total) by mouth at bedtime as needed for sleep.         Disposition and Follow-up: She has appointment to return to the [facility] on 11/17/2014 for injection and PICC flush  Significant Diagnostic Studies:  Dg Fluoro Guide Spinal/si Jt Inj Left  11/11/2014   CLINICAL DATA:  Diffuse large B-cell lymphoma  EXAM: FLUOROSCOPICALLY GUIDED LUMBAR PUNCTURE FOR INTRATHECAL  CHEMOTHERAPY  TECHNIQUE: Informed consent was obtained from the patient prior to the procedure, including potential complications of headache, allergy, and pain. A 'time out' was performed. With the patient prone, the lower back was prepped with Betadine. 1% Lidocaine was used for local anesthesia. Lumbar puncture was performed at the L4-L5 level using a 22 gauge needle with return of clear CSF. 12 mg of methotrexate was injected into the subarachnoid space. The patient tolerated the procedure well without apparent complication.  FLUOROSCOPY TIME:  18 seconds  IMPRESSION: Intrathecal injection of chemotherapy  without complication   Electronically Signed   By: Lovey Newcomer M.D.   On: 11/11/2014 15:11   Dg Fluoro Guide Spinal/si Jt Inj Left  10/21/2014   CLINICAL DATA:  Diffuse large B-cell lymphoma.  EXAM: FLUOROSCOPICALLY GUIDED LUMBAR PUNCTURE FOR INTRATHECAL  CHEMOTHERAPY  TECHNIQUE: Informed consent was obtained from the patient prior to the procedure, including potential complications of headache, allergy, and pain. A 'time out' was performed. With the patient prone, the lower back was prepped with Betadine. 1% Lidocaine was used for local anesthesia. Lumbar puncture was performed at the L3-L4 using a 22 gauge  needle with return of clear CSF. 12 mg of methotrexate was injected into the subarachnoid space. The patient tolerated the procedure well without apparent complication.  FLUOROSCOPY TIME:  1 min 19 seconds  IMPRESSION: Intrathecal injection of chemotherapy without complication. Third methotrexate injection.   Electronically Signed   By: Suzy Bouchard M.D.   On: 10/21/2014 17:34    Discharge Laboratory Values: Lab Results  Component Value Date   WBC 23.4* 11/11/2014   HGB 12.7 11/11/2014   HCT 40.2 11/11/2014   MCV 85.4 11/11/2014   PLT 336 11/11/2014   Lab Results  Component Value Date   NA 142 11/03/2014   K 4.0 11/03/2014   CL 106 10/01/2014   CO2 26 11/03/2014    Brief H and P: For complete details please refer to admission H and P, but in brief, the patient  was admitted to the hospital for cycle 4 of chemotherapy. She tolerated treatment well without any major side effects.  Physical Exam at Discharge: BP 120/66 mmHg  Pulse 50  Temp(Src) 97.8 F (36.6 C) (Oral)  Resp 18  Ht 5\' 6"  (1.676 m)  Wt 221 lb 11.2 oz (100.562 kg)  BMI 35.80 kg/m2  SpO2 100%  LMP 08/27/2014  ECOG PERFORMANCE STATUS:0  GENERAL:alert, no distress and comfortable. She is obese SKIN: skin color, texture, turgor are normal, no rashes or significant lesions EYES: normal, conjunctiva are pink and  non-injected, sclera clear OROPHARYNX:no exudate, no erythema and lips, buccal mucosa, and tongue normal  NECK: supple, thyroid normal size, non-tender, without nodularity LYMPH: no palpable lymphadenopathy in the cervical, axillary or inguinal LUNGS: clear to auscultation and percussion with normal breathing effort HEART: regular rate & rhythm and no murmurs and no lower extremity edema. Right PICC line normal ABDOMEN: soft, non-tender and normal bowel sounds Musculoskeletal:no cyanosis of digits and no clubbing  PSYCH: alert & oriented x 3 with fluent speech NEURO: no focal motor/sensory deficits    Hospital Course: She tolerated chemotherapy well without any side effects. Active Problems:   Diffuse large B cell lymphoma   Patient is Jehov[employer]'s Witness   DLBCL (diffuse large B cell lymphoma)   Diet:  Regular  Activity:  As tolerated  Condition at Discharge:   Stable  Signed: Dr. Heath Lark [phone]  11/14/2014, 7:54 AM  Spent 35 minutes on discharge

## 2014-11-14 NOTE — Plan of Care (Signed)
Problem: Phase I Progression Outcomes Goal: OOB as tolerated unless otherwise ordered Outcome: Completed/Met Date Met:  11/14/14 Ambulating around unit with family/friends

## 2014-11-15 ENCOUNTER — Ambulatory Visit: Payer: Medicaid Other

## 2014-11-17 ENCOUNTER — Telehealth: Payer: Self-pay | Admitting: Hematology and Oncology

## 2014-11-17 ENCOUNTER — Ambulatory Visit (HOSPITAL_BASED_OUTPATIENT_CLINIC_OR_DEPARTMENT_OTHER): Payer: Medicaid Other

## 2014-11-17 DIAGNOSIS — Z789 Other specified health status: Secondary | ICD-10-CM

## 2014-11-17 DIAGNOSIS — Z452 Encounter for adjustment and management of vascular access device: Secondary | ICD-10-CM

## 2014-11-17 DIAGNOSIS — Z5189 Encounter for other specified aftercare: Secondary | ICD-10-CM

## 2014-11-17 DIAGNOSIS — C8330 Diffuse large B-cell lymphoma, unspecified site: Secondary | ICD-10-CM

## 2014-11-17 DIAGNOSIS — IMO0001 Reserved for inherently not codable concepts without codable children: Secondary | ICD-10-CM

## 2014-11-17 MED ORDER — HEPARIN SOD (PORK) LOCK FLUSH 100 UNIT/ML IV SOLN
500.0000 [IU] | Freq: Once | INTRAVENOUS | Status: AC
  Administered 2014-11-17: 250 [IU] via INTRAVENOUS
  Filled 2014-11-17: qty 5

## 2014-11-17 MED ORDER — PEGFILGRASTIM INJECTION 6 MG/0.6ML
6.0000 mg | Freq: Once | SUBCUTANEOUS | Status: AC
  Administered 2014-11-17: 6 mg via SUBCUTANEOUS
  Filled 2014-11-17: qty 0.6

## 2014-11-17 MED ORDER — SODIUM CHLORIDE 0.9 % IJ SOLN
10.0000 mL | INTRAMUSCULAR | Status: DC | PRN
  Administered 2014-11-17: 10 mL via INTRAVENOUS
  Filled 2014-11-17: qty 10

## 2014-11-17 MED ORDER — EPOETIN ALFA 40000 UNIT/ML IJ SOLN
40000.0000 [IU] | INTRAMUSCULAR | Status: DC
  Administered 2014-11-17: 40000 [IU] via SUBCUTANEOUS
  Filled 2014-11-17: qty 1

## 2014-11-17 NOTE — Patient Instructions (Signed)
PICC Home Guide A peripherally inserted central catheter (PICC) is a long, thin, flexible tube that is inserted into a vein in the upper arm. It is a form of intravenous (IV) access. It is considered to be a "central" line because the tip of the PICC ends in a large vein in your chest. This large vein is called the superior vena cava (SVC). The PICC tip ends in the SVC because there is a lot of blood flow in the SVC. This allows medicines and IV fluids to be quickly distributed throughout the body. The PICC is inserted using a sterile technique by a specially trained nurse or physician. After the PICC is inserted, a chest X-ray exam is done to be sure it is in the correct place.  A PICC may be placed for different reasons, such as:  To give medicines and liquid nutrition that can only be given through a central line. Examples are:  Certain antibiotic treatments.  Chemotherapy.  Total parenteral nutrition (TPN).  To take frequent blood samples.  To give IV fluids and blood products.  If there is difficulty placing a peripheral intravenous (PIV) catheter. If taken care of properly, a PICC can remain in place for several months. A PICC can also allow a person to go home from the hospital early. Medicine and PICC care can be managed at home by a family member or home health care team. WHAT PROBLEMS CAN HAPPEN WHEN I HAVE A PICC? Problems with a PICC can occasionally occur. These may include the following:  A blood clot (thrombus) forming in or at the tip of the PICC. This can cause the PICC to become clogged. A clot-dissolving medicine called tissue plasminogen activator (tPA) can be given through the PICC to help break up the clot.  Inflammation of the vein (phlebitis) in which the PICC is placed. Signs of inflammation may include redness, pain at the insertion site, red streaks, or being able to feel a "cord" in the vein where the PICC is located.  Infection in the PICC or at the insertion  site. Signs of infection may include fever, chills, redness, swelling, or pus drainage from the PICC insertion site.  PICC movement (malposition). The PICC tip may move from its original position due to excessive physical activity, forceful coughing, sneezing, or vomiting.  A break or cut in the PICC. It is important to not use scissors near the PICC.  Nerve or tendon irritation or injury during PICC insertion. WHAT SHOULD I KEEP IN MIND ABOUT ACTIVITIES WHEN I HAVE A PICC?  You may bend your arm and move it freely. If your PICC is near or at the bend of your elbow, avoid activity with repeated motion at the elbow.  Rest at home for the remainder of the day following PICC line insertion.  Avoid lifting heavy objects as instructed by your health care provider.  Avoid using a crutch with the arm on the same side as your PICC. You may need to use a walker. WHAT SHOULD I KNOW ABOUT MY PICC DRESSING?  Keep your PICC bandage (dressing) clean and dry to prevent infection.  Ask your health care provider when you may shower. Ask your health care provider to teach you how to wrap the PICC when you do take a shower.  Change the PICC dressing as instructed by your health care provider.  Change your PICC dressing if it becomes loose or wet. WHAT SHOULD I KNOW ABOUT PICC CARE?  Check the PICC insertion site   daily for leakage, redness, swelling, or pain.  Do not take a bath, swim, or use hot tubs when you have a PICC. Cover PICC line with clear plastic wrap and tape to keep it dry while showering.  Flush the PICC as directed by your health care provider. Let your health care provider know right away if the PICC is difficult to flush or does not flush. Do not use force to flush the PICC.  Do not use a syringe that is less than 10 mL to flush the PICC.  Never pull or tug on the PICC.  Avoid blood pressure checks on the arm with the PICC.  Keep your PICC identification card with you at all  times.  Do not take the PICC out yourself. Only a trained clinical professional should remove the PICC. SEEK IMMEDIATE MEDICAL CARE IF:  Your PICC is accidentally pulled all the way out. If this happens, cover the insertion site with a bandage or gauze dressing. Do not throw the PICC away. Your health care provider will need to inspect it.  Your PICC was tugged or pulled and has partially come out. Do not  push the PICC back in.  There is any type of drainage, redness, or swelling where the PICC enters the skin.  You cannot flush the PICC, it is difficult to flush, or the PICC leaks around the insertion site when it is flushed.  You hear a "flushing" sound when the PICC is flushed.  You have pain, discomfort, or numbness in your arm, shoulder, or jaw on the same side as the PICC.  You feel your heart "racing" or skipping beats.  You notice a hole or tear in the PICC.  You develop chills or a fever. MAKE SURE YOU:   Understand these instructions.  Will watch your condition.  Will get help right away if you are not doing well or get worse. Document Released: 05/07/2003 Document Revised: 03/17/2014 Document Reviewed: 07/08/2013 Exit[employer] Patient Information 2015 [employer]. This information is not intended to replace advice given to you by your health care provider. Make sure you discuss any questions you have with your health care provider.  

## 2014-11-17 NOTE — Telephone Encounter (Signed)
gv adn printed appt sched and avs for pt for Jan ....per pt request she wanted later appt

## 2014-11-17 NOTE — Patient Instructions (Signed)
Epoetin Alfa injection What is this medicine? EPOETIN ALFA (e POE e tin AL fa) helps your body make more red blood cells. This medicine is used to treat anemia caused by chronic kidney failure, cancer chemotherapy, or HIV-therapy. It may also be used before surgery if you have anemia. This medicine may be used for other purposes; ask your health care provider or pharmacist if you have questions. COMMON BRAND NAME(S): Epogen, Procrit What should I tell my health care provider before I take this medicine? They need to know if you have any of these conditions: -blood clotting disorders -cancer patient not on chemotherapy -cystic fibrosis -heart disease, such as angina or heart failure -hemoglobin level of 12 g/dL or greater -high blood pressure -low levels of folate, iron, or vitamin B12 -seizures -an unusual or allergic reaction to erythropoietin, albumin, benzyl alcohol, hamster proteins, other medicines, foods, dyes, or preservatives -pregnant or trying to get pregnant -breast-feeding How should I use this medicine? This medicine is for injection into a vein or under the skin. It is usually given by a health care professional in a hospital or clinic setting. If you get this medicine at home, you will be taught how to prepare and give this medicine. Use exactly as directed. Take your medicine at regular intervals. Do not take your medicine more often than directed. It is important that you put your used needles and syringes in a special sharps container. Do not put them in a trash can. If you do not have a sharps container, call your pharmacist or healthcare provider to get one. Talk to your pediatrician regarding the use of this medicine in children. While this drug may be prescribed for selected conditions, precautions do apply. Overdosage: If you think you have taken too much of this medicine contact a poison control center or emergency room at once. NOTE: This medicine is only for you. Do  not share this medicine with others. What if I miss a dose? If you miss a dose, take it as soon as you can. If it is almost time for your next dose, take only that dose. Do not take double or extra doses. What may interact with this medicine? Do not take this medicine with any of the following medications: -darbepoetin alfa This list may not describe all possible interactions. Give your health care provider a list of all the medicines, herbs, non-prescription drugs, or dietary supplements you use. Also tell them if you smoke, drink alcohol, or use illegal drugs. Some items may interact with your medicine. What should I watch for while using this medicine? Visit your prescriber or health care professional for regular checks on your progress and for the needed blood tests and blood pressure measurements. It is especially important for the doctor to make sure your hemoglobin level is in the desired range, to limit the risk of potential side effects and to give you the best benefit. Keep all appointments for any recommended tests. Check your blood pressure as directed. Ask your doctor what your blood pressure should be and when you should contact him or her. As your body makes more red blood cells, you may need to take iron, folic acid, or vitamin B supplements. Ask your doctor or health care provider which products are right for you. If you have kidney disease continue dietary restrictions, even though this medication can make you feel better. Talk with your doctor or health care professional about the foods you eat and the vitamins that you take. What   side effects may I notice from receiving this medicine? Side effects that you should report to your doctor or health care professional as soon as possible: -allergic reactions like skin rash, itching or hives, swelling of the face, lips, or tongue -breathing problems -changes in vision -chest pain -confusion, trouble speaking or understanding -feeling  faint or lightheaded, falls -high blood pressure -muscle aches or pains -pain, swelling, warmth in the leg -rapid weight gain -severe headaches -sudden numbness or weakness of the face, arm or leg -trouble walking, dizziness, loss of balance or coordination -seizures (convulsions) -swelling of the ankles, feet, hands -unusually weak or tired Side effects that usually do not require medical attention (report to your doctor or health care professional if they continue or are bothersome): -diarrhea -fever, chills (flu-like symptoms) -headaches -nausea, vomiting -redness, stinging, or swelling at site where injected This list may not describe all possible side effects. Call your doctor for medical advice about side effects. You may report side effects to FDA at 1-800-FDA-1088. Where should I keep my medicine? Keep out of the reach of children. Store in a refrigerator between 2 and 8 degrees C (36 and 46 degrees F). Do not freeze or shake. Throw away any unused portion if using a single-dose vial. Multi-dose vials can be kept in the refrigerator for up to 21 days after the initial dose. Throw away unused medicine. NOTE: This sheet is a summary. It may not cover all possible information. If you have questions about this medicine, talk to your doctor, pharmacist, or health care provider.  2015, [employer]vier/Gold Standard. (2008-10-14 10:25:44) Pegfilgrastim injection What is this medicine? PEGFILGRASTIM (peg fil GRA stim) is a long-acting granulocyte colony-stimulating factor that stimulates the growth of neutrophils, a type of white blood cell important in the body's fight against infection. It is used to reduce the incidence of fever and infection in patients with certain types of cancer who are receiving chemotherapy that affects the bone marrow. This medicine may be used for other purposes; ask your health care provider or pharmacist if you have questions. COMMON BRAND NAME(S): Neulasta What  should I tell my health care provider before I take this medicine? They need to know if you have any of these conditions: -latex allergy -ongoing radiation therapy -sickle cell disease -skin reactions to acrylic adhesives (On-Body Injector only) -an unusual or allergic reaction to pegfilgrastim, filgrastim, other medicines, foods, dyes, or preservatives -pregnant or trying to get pregnant -breast-feeding How should I use this medicine? This medicine is for injection under the skin. If you get this medicine at home, you will be taught how to prepare and give the pre-filled syringe or how to use the On-body Injector. Refer to the patient Instructions for Use for detailed instructions. Use exactly as directed. Take your medicine at regular intervals. Do not take your medicine more often than directed. It is important that you put your used needles and syringes in a special sharps container. Do not put them in a trash can. If you do not have a sharps container, call your pharmacist or healthcare provider to get one. Talk to your pediatrician regarding the use of this medicine in children. Special care may be needed. Overdosage: If you think you have taken too much of this medicine contact a poison control center or emergency room at once. NOTE: This medicine is only for you. Do not share this medicine with others. What if I miss a dose? It is important not to miss your dose. Call your doctor  or health care professional if you miss your dose. If you miss a dose due to an On-body Injector failure or leakage, a new dose should be administered as soon as possible using a single prefilled syringe for manual use. What may interact with this medicine? Interactions have not been studied. Give your health care provider a list of all the medicines, herbs, non-prescription drugs, or dietary supplements you use. Also tell them if you smoke, drink alcohol, or use illegal drugs. Some items may interact with your  medicine. This list may not describe all possible interactions. Give your health care provider a list of all the medicines, herbs, non-prescription drugs, or dietary supplements you use. Also tell them if you smoke, drink alcohol, or use illegal drugs. Some items may interact with your medicine. What should I watch for while using this medicine? You may need blood work done while you are taking this medicine. If you are going to need a MRI, CT scan, or other procedure, tell your doctor that you are using this medicine (On-Body Injector only). What side effects may I notice from receiving this medicine? Side effects that you should report to your doctor or health care professional as soon as possible: -allergic reactions like skin rash, itching or hives, swelling of the face, lips, or tongue -dizziness -fever -pain, redness, or irritation at site where injected -pinpoint red spots on the skin -shortness of breath or breathing problems -stomach or side pain, or pain at the shoulder -swelling -tiredness -trouble passing urine Side effects that usually do not require medical attention (report to your doctor or health care professional if they continue or are bothersome): -bone pain -muscle pain This list may not describe all possible side effects. Call your doctor for medical advice about side effects. You may report side effects to FDA at 1-800-FDA-1088. Where should I keep my medicine? Keep out of the reach of children. Store pre-filled syringes in a refrigerator between 2 and 8 degrees C (36 and 46 degrees F). Do not freeze. Keep in carton to protect from light. Throw away this medicine if it is left out of the refrigerator for more than 48 hours. Throw away any unused medicine after the expiration date. NOTE: This sheet is a summary. It may not cover all possible information. If you have questions about this medicine, talk to your doctor, pharmacist, or health care provider.  2015,  [employer] Standard. (2014-01-30 16:14:05)

## 2014-11-19 ENCOUNTER — Ambulatory Visit (HOSPITAL_BASED_OUTPATIENT_CLINIC_OR_DEPARTMENT_OTHER): Payer: Medicaid Other

## 2014-11-19 DIAGNOSIS — Z452 Encounter for adjustment and management of vascular access device: Secondary | ICD-10-CM

## 2014-11-19 DIAGNOSIS — C8330 Diffuse large B-cell lymphoma, unspecified site: Secondary | ICD-10-CM

## 2014-11-19 MED ORDER — HEPARIN SOD (PORK) LOCK FLUSH 100 UNIT/ML IV SOLN
500.0000 [IU] | Freq: Once | INTRAVENOUS | Status: AC
  Administered 2014-11-19: 250 [IU] via INTRAVENOUS
  Filled 2014-11-19: qty 5

## 2014-11-19 MED ORDER — SODIUM CHLORIDE 0.9 % IJ SOLN
10.0000 mL | INTRAMUSCULAR | Status: DC | PRN
  Administered 2014-11-19: 10 mL via INTRAVENOUS
  Filled 2014-11-19: qty 10

## 2014-11-19 NOTE — Patient Instructions (Signed)
PICC Home Guide A peripherally inserted central catheter (PICC) is a long, thin, flexible tube that is inserted into a vein in the upper arm. It is a form of intravenous (IV) access. It is considered to be a "central" line because the tip of the PICC ends in a large vein in your chest. This large vein is called the superior vena cava (SVC). The PICC tip ends in the SVC because there is a lot of blood flow in the SVC. This allows medicines and IV fluids to be quickly distributed throughout the body. The PICC is inserted using a sterile technique by a specially trained nurse or physician. After the PICC is inserted, a chest X-ray exam is done to be sure it is in the correct place.  A PICC may be placed for different reasons, such as:  To give medicines and liquid nutrition that can only be given through a central line. Examples are:  Certain antibiotic treatments.  Chemotherapy.  Total parenteral nutrition (TPN).  To take frequent blood samples.  To give IV fluids and blood products.  If there is difficulty placing a peripheral intravenous (PIV) catheter. If taken care of properly, a PICC can remain in place for several months. A PICC can also allow a person to go home from the hospital early. Medicine and PICC care can be managed at home by a family member or home health care team. WHAT PROBLEMS CAN HAPPEN WHEN I HAVE A PICC? Problems with a PICC can occasionally occur. These may include the following:  A blood clot (thrombus) forming in or at the tip of the PICC. This can cause the PICC to become clogged. A clot-dissolving medicine called tissue plasminogen activator (tPA) can be given through the PICC to help break up the clot.  Inflammation of the vein (phlebitis) in which the PICC is placed. Signs of inflammation may include redness, pain at the insertion site, red streaks, or being able to feel a "cord" in the vein where the PICC is located.  Infection in the PICC or at the insertion  site. Signs of infection may include fever, chills, redness, swelling, or pus drainage from the PICC insertion site.  PICC movement (malposition). The PICC tip may move from its original position due to excessive physical activity, forceful coughing, sneezing, or vomiting.  A break or cut in the PICC. It is important to not use scissors near the PICC.  Nerve or tendon irritation or injury during PICC insertion. WHAT SHOULD I KEEP IN MIND ABOUT ACTIVITIES WHEN I HAVE A PICC?  You may bend your arm and move it freely. If your PICC is near or at the bend of your elbow, avoid activity with repeated motion at the elbow.  Rest at home for the remainder of the day following PICC line insertion.  Avoid lifting heavy objects as instructed by your health care provider.  Avoid using a crutch with the arm on the same side as your PICC. You may need to use a walker. WHAT SHOULD I KNOW ABOUT MY PICC DRESSING?  Keep your PICC bandage (dressing) clean and dry to prevent infection.  Ask your health care provider when you may shower. Ask your health care provider to teach you how to wrap the PICC when you do take a shower.  Change the PICC dressing as instructed by your health care provider.  Change your PICC dressing if it becomes loose or wet. WHAT SHOULD I KNOW ABOUT PICC CARE?  Check the PICC insertion site   daily for leakage, redness, swelling, or pain.  Do not take a bath, swim, or use hot tubs when you have a PICC. Cover PICC line with clear plastic wrap and tape to keep it dry while showering.  Flush the PICC as directed by your health care provider. Let your health care provider know right away if the PICC is difficult to flush or does not flush. Do not use force to flush the PICC.  Do not use a syringe that is less than 10 mL to flush the PICC.  Never pull or tug on the PICC.  Avoid blood pressure checks on the arm with the PICC.  Keep your PICC identification card with you at all  times.  Do not take the PICC out yourself. Only a trained clinical professional should remove the PICC. SEEK IMMEDIATE MEDICAL CARE IF:  Your PICC is accidentally pulled all the way out. If this happens, cover the insertion site with a bandage or gauze dressing. Do not throw the PICC away. Your health care provider will need to inspect it.  Your PICC was tugged or pulled and has partially come out. Do not  push the PICC back in.  There is any type of drainage, redness, or swelling where the PICC enters the skin.  You cannot flush the PICC, it is difficult to flush, or the PICC leaks around the insertion site when it is flushed.  You hear a "flushing" sound when the PICC is flushed.  You have pain, discomfort, or numbness in your arm, shoulder, or jaw on the same side as the PICC.  You feel your heart "racing" or skipping beats.  You notice a hole or tear in the PICC.  You develop chills or a fever. MAKE SURE YOU:   Understand these instructions.  Will watch your condition.  Will get help right away if you are not doing well or get worse. Document Released: 05/07/2003 Document Revised: 03/17/2014 Document Reviewed: 07/08/2013 Exit[employer] Patient Information 2015 [employer]. This information is not intended to replace advice given to you by your health care provider. Make sure you discuss any questions you have with your health care provider.  

## 2014-11-21 ENCOUNTER — Ambulatory Visit (HOSPITAL_BASED_OUTPATIENT_CLINIC_OR_DEPARTMENT_OTHER): Payer: Medicaid Other

## 2014-11-21 DIAGNOSIS — C8330 Diffuse large B-cell lymphoma, unspecified site: Secondary | ICD-10-CM

## 2014-11-21 DIAGNOSIS — Z452 Encounter for adjustment and management of vascular access device: Secondary | ICD-10-CM

## 2014-11-21 MED ORDER — SODIUM CHLORIDE 0.9 % IJ SOLN
10.0000 mL | INTRAMUSCULAR | Status: DC | PRN
  Administered 2014-11-21: 10 mL via INTRAVENOUS
  Filled 2014-11-21: qty 10

## 2014-11-21 MED ORDER — HEPARIN SOD (PORK) LOCK FLUSH 100 UNIT/ML IV SOLN
500.0000 [IU] | Freq: Once | INTRAVENOUS | Status: AC
  Administered 2014-11-21: 500 [IU] via INTRAVENOUS
  Filled 2014-11-21: qty 5

## 2014-11-24 ENCOUNTER — Ambulatory Visit (HOSPITAL_BASED_OUTPATIENT_CLINIC_OR_DEPARTMENT_OTHER): Payer: Medicaid Other

## 2014-11-24 ENCOUNTER — Other Ambulatory Visit: Payer: Self-pay

## 2014-11-24 ENCOUNTER — Ambulatory Visit: Payer: Medicaid Other

## 2014-11-24 DIAGNOSIS — C8330 Diffuse large B-cell lymphoma, unspecified site: Secondary | ICD-10-CM

## 2014-11-24 DIAGNOSIS — IMO0001 Reserved for inherently not codable concepts without codable children: Secondary | ICD-10-CM

## 2014-11-24 DIAGNOSIS — Z789 Other specified health status: Secondary | ICD-10-CM

## 2014-11-24 MED ORDER — EPOETIN ALFA 40000 UNIT/ML IJ SOLN
40000.0000 [IU] | INTRAMUSCULAR | Status: DC
  Administered 2014-11-24: 40000 [IU] via SUBCUTANEOUS
  Filled 2014-11-24: qty 1

## 2014-11-24 MED ORDER — HEPARIN SOD (PORK) LOCK FLUSH 100 UNIT/ML IV SOLN
500.0000 [IU] | Freq: Once | INTRAVENOUS | Status: AC
  Administered 2014-11-24: 250 [IU] via INTRAVENOUS
  Filled 2014-11-24: qty 5

## 2014-11-24 MED ORDER — SODIUM CHLORIDE 0.9 % IJ SOLN
10.0000 mL | INTRAMUSCULAR | Status: DC | PRN
  Administered 2014-11-24: 10 mL via INTRAVENOUS
  Filled 2014-11-24: qty 10

## 2014-11-24 NOTE — Patient Instructions (Signed)
Epoetin Alfa injection What is this medicine? EPOETIN ALFA (e POE e tin AL fa) helps your body make more red blood cells. This medicine is used to treat anemia caused by chronic kidney failure, cancer chemotherapy, or HIV-therapy. It may also be used before surgery if you have anemia. This medicine may be used for other purposes; ask your health care provider or pharmacist if you have questions. COMMON BRAND NAME(S): Epogen, Procrit What should I tell my health care provider before I take this medicine? They need to know if you have any of these conditions: -blood clotting disorders -cancer patient not on chemotherapy -cystic fibrosis -heart disease, such as angina or heart failure -hemoglobin level of 12 g/dL or greater -high blood pressure -low levels of folate, iron, or vitamin B12 -seizures -an unusual or allergic reaction to erythropoietin, albumin, benzyl alcohol, hamster proteins, other medicines, foods, dyes, or preservatives -pregnant or trying to get pregnant -breast-feeding How should I use this medicine? This medicine is for injection into a vein or under the skin. It is usually given by a health care professional in a hospital or clinic setting. If you get this medicine at home, you will be taught how to prepare and give this medicine. Use exactly as directed. Take your medicine at regular intervals. Do not take your medicine more often than directed. It is important that you put your used needles and syringes in a special sharps container. Do not put them in a trash can. If you do not have a sharps container, call your pharmacist or healthcare provider to get one. Talk to your pediatrician regarding the use of this medicine in children. While this drug may be prescribed for selected conditions, precautions do apply. Overdosage: If you think you have taken too much of this medicine contact a poison control center or emergency room at once. NOTE: This medicine is only for you. Do  not share this medicine with others. What if I miss a dose? If you miss a dose, take it as soon as you can. If it is almost time for your next dose, take only that dose. Do not take double or extra doses. What may interact with this medicine? Do not take this medicine with any of the following medications: -darbepoetin alfa This list may not describe all possible interactions. Give your health care provider a list of all the medicines, herbs, non-prescription drugs, or dietary supplements you use. Also tell them if you smoke, drink alcohol, or use illegal drugs. Some items may interact with your medicine. What should I watch for while using this medicine? Visit your prescriber or health care professional for regular checks on your progress and for the needed blood tests and blood pressure measurements. It is especially important for the doctor to make sure your hemoglobin level is in the desired range, to limit the risk of potential side effects and to give you the best benefit. Keep all appointments for any recommended tests. Check your blood pressure as directed. Ask your doctor what your blood pressure should be and when you should contact him or her. As your body makes more red blood cells, you may need to take iron, folic acid, or vitamin B supplements. Ask your doctor or health care provider which products are right for you. If you have kidney disease continue dietary restrictions, even though this medication can make you feel better. Talk with your doctor or health care professional about the foods you eat and the vitamins that you take. What   side effects may I notice from receiving this medicine? Side effects that you should report to your doctor or health care professional as soon as possible: -allergic reactions like skin rash, itching or hives, swelling of the face, lips, or tongue -breathing problems -changes in vision -chest pain -confusion, trouble speaking or understanding -feeling  faint or lightheaded, falls -high blood pressure -muscle aches or pains -pain, swelling, warmth in the leg -rapid weight gain -severe headaches -sudden numbness or weakness of the face, arm or leg -trouble walking, dizziness, loss of balance or coordination -seizures (convulsions) -swelling of the ankles, feet, hands -unusually weak or tired Side effects that usually do not require medical attention (report to your doctor or health care professional if they continue or are bothersome): -diarrhea -fever, chills (flu-like symptoms) -headaches -nausea, vomiting -redness, stinging, or swelling at site where injected This list may not describe all possible side effects. Call your doctor for medical advice about side effects. You may report side effects to FDA at 1-800-FDA-1088. Where should I keep my medicine? Keep out of the reach of children. Store in a refrigerator between 2 and 8 degrees C (36 and 46 degrees F). Do not freeze or shake. Throw away any unused portion if using a single-dose vial. Multi-dose vials can be kept in the refrigerator for up to 21 days after the initial dose. Throw away unused medicine. NOTE: This sheet is a summary. It may not cover all possible information. If you have questions about this medicine, talk to your doctor, pharmacist, or health care provider.  2015, Elsevier/Gold Standard. (2008-10-14 10:25:44) PICC Home Guide A peripherally inserted central catheter (PICC) is a long, thin, flexible tube that is inserted into a vein in the upper arm. It is a form of intravenous (IV) access. It is considered to be a "central" line because the tip of the PICC ends in a large vein in your chest. This large vein is called the superior vena cava (SVC). The PICC tip ends in the SVC because there is a lot of blood flow in the SVC. This allows medicines and IV fluids to be quickly distributed throughout the body. The PICC is inserted using a sterile technique by a specially  trained nurse or physician. After the PICC is inserted, a chest X-ray exam is done to be sure it is in the correct place.  A PICC may be placed for different reasons, such as:  To give medicines and liquid nutrition that can only be given through a central line. Examples are:  Certain antibiotic treatments.  Chemotherapy.  Total parenteral nutrition (TPN).  To take frequent blood samples.  To give IV fluids and blood products.  If there is difficulty placing a peripheral intravenous (PIV) catheter. If taken care of properly, a PICC can remain in place for several months. A PICC can also allow a person to go home from the hospital early. Medicine and PICC care can be managed at home by a family member or home health care team. [place] A PICC? Problems with a PICC can occasionally occur. These may include the following:  A blood clot (thrombus) forming in or at the tip of the PICC. This can cause the PICC to become clogged. A clot-dissolving medicine called tissue plasminogen activator (tPA) can be given through the PICC to help break up the clot.  Inflammation of the vein (phlebitis) in which the PICC is placed. Signs of inflammation may include redness, pain at the insertion  site, red streaks, or being able to feel a "cord" in the vein where the PICC is located.  Infection in the PICC or at the insertion site. Signs of infection may include fever, chills, redness, swelling, or pus drainage from the PICC insertion site.  PICC movement (malposition). The PICC tip may move from its original position due to excessive physical activity, forceful coughing, sneezing, or vomiting.  A break or cut in the PICC. It is important to not use scissors near the PICC.  Nerve or tendon irritation or injury during PICC insertion. WHAT SHOULD I KEEP IN MIND ABOUT ACTIVITIES WHEN I HAVE A PICC?  You may bend your arm and move it freely. If your PICC is near or at the bend of  your elbow, avoid activity with repeated motion at the elbow.  Rest at home for the remainder of the day following PICC line insertion.  Avoid lifting heavy objects as instructed by your health care provider.  Avoid using a crutch with the arm on the same side as your PICC. You may need to use a walker. WHAT SHOULD I KNOW ABOUT MY PICC DRESSING?  Keep your PICC bandage (dressing) clean and dry to prevent infection.  Ask your health care provider when you may shower. Ask your health care provider to teach you how to wrap the PICC when you do take a shower.  Change the PICC dressing as instructed by your health care provider.  Change your PICC dressing if it becomes loose or wet. WHAT SHOULD I KNOW ABOUT PICC CARE?  Check the PICC insertion site daily for leakage, redness, swelling, or pain.  Do not take a bath, swim, or use hot tubs when you have a PICC. Cover PICC line with clear plastic wrap and tape to keep it dry while showering.  Flush the PICC as directed by your health care provider. Let your health care provider know right away if the PICC is difficult to flush or does not flush. Do not use force to flush the PICC.  Do not use a syringe that is less than 10 mL to flush the PICC.  Never pull or tug on the PICC.  Avoid blood pressure checks on the arm with the PICC.  Keep your PICC identification card with you at all times.  Do not take the PICC out yourself. Only a trained clinical professional should remove the PICC. SEEK IMMEDIATE MEDICAL CARE IF:  Your PICC is accidentally pulled all the way out. If this happens, cover the insertion site with a bandage or gauze dressing. Do not throw the PICC away. Your health care provider will need to inspect it.  Your PICC was tugged or pulled and has partially come out. Do not  push the PICC back in.  There is any type of drainage, redness, or swelling where the PICC enters the skin.  You cannot flush the PICC, it is difficult to  flush, or the PICC leaks around the insertion site when it is flushed.  You hear a "flushing" sound when the PICC is flushed.  You have pain, discomfort, or numbness in your arm, shoulder, or jaw on the same side as the PICC.  You feel your heart "racing" or skipping beats.  You notice a hole or tear in the PICC.  You develop chills or a fever. MAKE SURE YOU:   Understand these instructions.  Will watch your condition.  Will get help right away if you are not doing well or get worse.  Document Released: 05/07/2003 Document Revised: 03/17/2014 Document Reviewed: 07/08/2013 [facility] Patient Information 2015 [place], [place]. This information is not intended to replace advice given to you by your health care provider. Make sure you discuss any questions you have with your health care provider.

## 2014-11-26 ENCOUNTER — Ambulatory Visit (HOSPITAL_BASED_OUTPATIENT_CLINIC_OR_DEPARTMENT_OTHER): Payer: Medicaid Other

## 2014-11-26 VITALS — BP 131/53 | HR 84 | Temp 98.0°F

## 2014-11-26 DIAGNOSIS — C8330 Diffuse large B-cell lymphoma, unspecified site: Secondary | ICD-10-CM

## 2014-11-26 DIAGNOSIS — Z452 Encounter for adjustment and management of vascular access device: Secondary | ICD-10-CM

## 2014-11-26 MED ORDER — SODIUM CHLORIDE 0.9 % IJ SOLN
10.0000 mL | INTRAMUSCULAR | Status: DC | PRN
  Administered 2014-11-26: 10 mL via INTRAVENOUS
  Filled 2014-11-26: qty 10

## 2014-11-26 MED ORDER — HEPARIN SOD (PORK) LOCK FLUSH 100 UNIT/ML IV SOLN
500.0000 [IU] | Freq: Once | INTRAVENOUS | Status: AC
Start: 2014-11-26 — End: 2014-11-26
  Administered 2014-11-26: 250 [IU] via INTRAVENOUS
  Filled 2014-11-26: qty 5

## 2014-11-26 NOTE — Patient Instructions (Signed)
PICC Home Guide A peripherally inserted central catheter (PICC) is a long, thin, flexible tube that is inserted into a vein in the upper arm. It is a form of intravenous (IV) access. It is considered to be a "central" line because the tip of the PICC ends in a large vein in your chest. This large vein is called the superior vena cava (SVC). The PICC tip ends in the SVC because there is a lot of blood flow in the SVC. This allows medicines and IV fluids to be quickly distributed throughout the body. The PICC is inserted using a sterile technique by a specially trained nurse or physician. After the PICC is inserted, a chest X-ray exam is done to be sure it is in the correct place.  A PICC may be placed for different reasons, such as:  To give medicines and liquid nutrition that can only be given through a central line. Examples are:  Certain antibiotic treatments.  Chemotherapy.  Total parenteral nutrition (TPN).  To take frequent blood samples.  To give IV fluids and blood products.  If there is difficulty placing a peripheral intravenous (PIV) catheter. If taken care of properly, a PICC can remain in place for several months. A PICC can also allow a person to go home from the hospital early. Medicine and PICC care can be managed at home by a family member or home health care team. WHAT PROBLEMS CAN HAPPEN WHEN I HAVE A PICC? Problems with a PICC can occasionally occur. These may include the following:  A blood clot (thrombus) forming in or at the tip of the PICC. This can cause the PICC to become clogged. A clot-dissolving medicine called tissue plasminogen activator (tPA) can be given through the PICC to help break up the clot.  Inflammation of the vein (phlebitis) in which the PICC is placed. Signs of inflammation may include redness, pain at the insertion site, red streaks, or being able to feel a "cord" in the vein where the PICC is located.  Infection in the PICC or at the insertion  site. Signs of infection may include fever, chills, redness, swelling, or pus drainage from the PICC insertion site.  PICC movement (malposition). The PICC tip may move from its original position due to excessive physical activity, forceful coughing, sneezing, or vomiting.  A break or cut in the PICC. It is important to not use scissors near the PICC.  Nerve or tendon irritation or injury during PICC insertion. WHAT SHOULD I KEEP IN MIND ABOUT ACTIVITIES WHEN I HAVE A PICC?  You may bend your arm and move it freely. If your PICC is near or at the bend of your elbow, avoid activity with repeated motion at the elbow.  Rest at home for the remainder of the day following PICC line insertion.  Avoid lifting heavy objects as instructed by your health care provider.  Avoid using a crutch with the arm on the same side as your PICC. You may need to use a walker. WHAT SHOULD I KNOW ABOUT MY PICC DRESSING?  Keep your PICC bandage (dressing) clean and dry to prevent infection.  Ask your health care provider when you may shower. Ask your health care provider to teach you how to wrap the PICC when you do take a shower.  Change the PICC dressing as instructed by your health care provider.  Change your PICC dressing if it becomes loose or wet. WHAT SHOULD I KNOW ABOUT PICC CARE?  Check the PICC insertion site   daily for leakage, redness, swelling, or pain.  Do not take a bath, swim, or use hot tubs when you have a PICC. Cover PICC line with clear plastic wrap and tape to keep it dry while showering.  Flush the PICC as directed by your health care provider. Let your health care provider know right away if the PICC is difficult to flush or does not flush. Do not use force to flush the PICC.  Do not use a syringe that is less than 10 mL to flush the PICC.  Never pull or tug on the PICC.  Avoid blood pressure checks on the arm with the PICC.  Keep your PICC identification card with you at all  times.  Do not take the PICC out yourself. Only a trained clinical professional should remove the PICC. SEEK IMMEDIATE MEDICAL CARE IF:  Your PICC is accidentally pulled all the way out. If this happens, cover the insertion site with a bandage or gauze dressing. Do not throw the PICC away. Your health care provider will need to inspect it.  Your PICC was tugged or pulled and has partially come out. Do not  push the PICC back in.  There is any type of drainage, redness, or swelling where the PICC enters the skin.  You cannot flush the PICC, it is difficult to flush, or the PICC leaks around the insertion site when it is flushed.  You hear a "flushing" sound when the PICC is flushed.  You have pain, discomfort, or numbness in your arm, shoulder, or jaw on the same side as the PICC.  You feel your heart "racing" or skipping beats.  You notice a hole or tear in the PICC.  You develop chills or a fever. MAKE SURE YOU:   Understand these instructions.  Will watch your condition.  Will get help right away if you are not doing well or get worse. Document Released: 05/07/2003 Document Revised: 03/17/2014 Document Reviewed: 07/08/2013 Exit[employer] Patient Information 2015 [employer]. This information is not intended to replace advice given to you by your health care provider. Make sure you discuss any questions you have with your health care provider.  

## 2014-11-27 ENCOUNTER — Other Ambulatory Visit: Payer: Self-pay | Admitting: Hematology and Oncology

## 2014-11-27 DIAGNOSIS — C8330 Diffuse large B-cell lymphoma, unspecified site: Secondary | ICD-10-CM

## 2014-11-28 ENCOUNTER — Other Ambulatory Visit (HOSPITAL_BASED_OUTPATIENT_CLINIC_OR_DEPARTMENT_OTHER): Payer: Medicaid Other

## 2014-11-28 ENCOUNTER — Telehealth: Payer: Self-pay | Admitting: Hematology and Oncology

## 2014-11-28 ENCOUNTER — Ambulatory Visit (HOSPITAL_BASED_OUTPATIENT_CLINIC_OR_DEPARTMENT_OTHER): Payer: Medicaid Other

## 2014-11-28 ENCOUNTER — Ambulatory Visit (HOSPITAL_BASED_OUTPATIENT_CLINIC_OR_DEPARTMENT_OTHER): Payer: Medicaid Other | Admitting: Hematology and Oncology

## 2014-11-28 VITALS — Ht 66.0 in | Wt 221.1 lb

## 2014-11-28 VITALS — BP 124/63 | HR 74 | Temp 98.6°F | Resp 20

## 2014-11-28 DIAGNOSIS — C8330 Diffuse large B-cell lymphoma, unspecified site: Secondary | ICD-10-CM

## 2014-11-28 DIAGNOSIS — E790 Hyperuricemia without signs of inflammatory arthritis and tophaceous disease: Secondary | ICD-10-CM

## 2014-11-28 DIAGNOSIS — G620 Drug-induced polyneuropathy: Secondary | ICD-10-CM | POA: Insufficient documentation

## 2014-11-28 DIAGNOSIS — T451X5A Adverse effect of antineoplastic and immunosuppressive drugs, initial encounter: Secondary | ICD-10-CM

## 2014-11-28 DIAGNOSIS — IMO0001 Reserved for inherently not codable concepts without codable children: Secondary | ICD-10-CM

## 2014-11-28 DIAGNOSIS — Z789 Other specified health status: Secondary | ICD-10-CM

## 2014-11-28 DIAGNOSIS — Z95828 Presence of other vascular implants and grafts: Secondary | ICD-10-CM

## 2014-11-28 LAB — CBC WITH DIFFERENTIAL/PLATELET
BASO%: 0.3 % (ref 0.0–2.0)
Basophils Absolute: 0.1 10*3/uL (ref 0.0–0.1)
EOS%: 0.3 % (ref 0.0–7.0)
Eosinophils Absolute: 0.1 10*3/uL (ref 0.0–0.5)
HEMATOCRIT: 38.3 % (ref 34.8–46.6)
HEMOGLOBIN: 12.1 g/dL (ref 11.6–15.9)
LYMPH#: 1.9 10*3/uL (ref 0.9–3.3)
LYMPH%: 8.8 % — ABNORMAL LOW (ref 14.0–49.7)
MCH: 27.2 pg (ref 25.1–34.0)
MCHC: 31.6 g/dL (ref 31.5–36.0)
MCV: 86.1 fL (ref 79.5–101.0)
MONO#: 0.8 10*3/uL (ref 0.1–0.9)
MONO%: 3.7 % (ref 0.0–14.0)
NEUT%: 86.9 % — ABNORMAL HIGH (ref 38.4–76.8)
NEUTROS ABS: 19 10*3/uL — AB (ref 1.5–6.5)
PLATELETS: 244 10*3/uL (ref 145–400)
RBC: 4.45 10*6/uL (ref 3.70–5.45)
RDW: 17.8 % — ABNORMAL HIGH (ref 11.2–14.5)
WBC: 21.8 10*3/uL — AB (ref 3.9–10.3)

## 2014-11-28 LAB — COMPREHENSIVE METABOLIC PANEL (CC13)
ALK PHOS: 143 U/L (ref 40–150)
ALT: 14 U/L (ref 0–55)
AST: 13 U/L (ref 5–34)
Albumin: 3.8 g/dL (ref 3.5–5.0)
Anion Gap: 11 mEq/L (ref 3–11)
BUN: 9.8 mg/dL (ref 7.0–26.0)
CALCIUM: 9.4 mg/dL (ref 8.4–10.4)
CHLORIDE: 107 meq/L (ref 98–109)
CO2: 25 meq/L (ref 22–29)
Creatinine: 0.8 mg/dL (ref 0.6–1.1)
Glucose: 88 mg/dl (ref 70–140)
Potassium: 4 mEq/L (ref 3.5–5.1)
SODIUM: 143 meq/L (ref 136–145)
TOTAL PROTEIN: 6.7 g/dL (ref 6.4–8.3)
Total Bilirubin: 0.21 mg/dL (ref 0.20–1.20)

## 2014-11-28 LAB — LACTATE DEHYDROGENASE (CC13): LDH: 411 U/L — AB (ref 125–245)

## 2014-11-28 LAB — URIC ACID (CC13): Uric Acid, Serum: 8.4 mg/dl — ABNORMAL HIGH (ref 2.6–7.4)

## 2014-11-28 MED ORDER — SODIUM CHLORIDE 0.9 % IJ SOLN
10.0000 mL | INTRAMUSCULAR | Status: DC | PRN
  Administered 2014-11-28: 10 mL via INTRAVENOUS
  Filled 2014-11-28: qty 10

## 2014-11-28 MED ORDER — HEPARIN SOD (PORK) LOCK FLUSH 100 UNIT/ML IV SOLN
500.0000 [IU] | Freq: Once | INTRAVENOUS | Status: AC
  Administered 2014-11-28: 500 [IU] via INTRAVENOUS
  Filled 2014-11-28: qty 5

## 2014-11-28 NOTE — Progress Notes (Signed)
[facility] OFFICE PROGRESS NOTE  Patient Care Team: No Pcp Per Patient as PCP - General (General Practice)  SUMMARY OF ONCOLOGIC HISTORY: Oncology History   Diffuse large B cell lymphoma   Primary site: Lymphoid Neoplasms   Staging method: AJCC 6th Edition   Clinical: Stage IV signed by Heath Lark, MD on 09/03/2014  8:18 PM   Summary: Stage IV IPI of 3: high LDH, >2 extranodal disease and stage IV at presentation       Diffuse large B cell lymphoma   07/30/2014 Imaging She had a bilateral mammogram which came back abnormal. Ultrasound-guided biopsy was performed, suspicious for lymphoma   08/25/2014 Imaging PET/CT scan showed diffuse metastatic cancer involving bilateral breasts, bone, and large abdominal mass   08/28/2014 Surgery She had excisional surgery and removal of right breast mass   08/28/2014 Pathology Results Accession: [id number] right breast mass is consistent with diffuse large B-cell lymphoma with high proliferation index.   09/04/2014 - 09/11/2014 Hospital Admission she is admitted to the hospital for cycle 1 of [place]   09/04/2014 Imaging echocardiogram is normal.   09/04/2014 Procedure she has placement of PICC line.   10/14/2014 Imaging PET/CT scan showed near complete response to treatment.    INTERVAL HISTORY: Please see below for problem oriented charting. She is seen prior to cycle 5 of treatment. She has no symptoms. She have no menstruation since the start of treatment. She complained of very mild peripheral neuropathy  REVIEW OF SYSTEMS:   Constitutional: Denies fevers, chills or abnormal weight loss Eyes: Denies blurriness of vision Ears, nose, mouth, throat, and face: Denies mucositis or sore throat Respiratory: Denies cough, dyspnea or wheezes Cardiovascular: Denies palpitation, chest discomfort or lower extremity swelling Gastrointestinal:  Denies nausea, heartburn or change in bowel habits Skin: Denies abnormal skin  rashes Lymphatics: Denies new lymphadenopathy or easy bruising Neurological:Denies numbness, tingling or new weaknesses Behavioral/Psych: Mood is stable, no new changes  All other systems were reviewed with the patient and are negative.  I have reviewed the past medical history, past surgical history, social history and family history with the patient and they are unchanged from previous note.  ALLERGIES:  is allergic to other and soap.  MEDICATIONS:  Current Outpatient Prescriptions  Medication Sig Dispense Refill  . ondansetron (ZOFRAN) 8 MG tablet Take 1 tablet (8 mg total) by mouth every 8 (eight) hours as needed for nausea. (Patient not taking: Reported on 11/28/2014) 30 tablet 3  . prochlorperazine (COMPAZINE) 10 MG tablet Take 1 tablet (10 mg total) by mouth every 6 (six) hours as needed for nausea. (Patient not taking: Reported on 11/28/2014) 30 tablet 3  . zolpidem (AMBIEN) 10 MG tablet Take 1 tablet (10 mg total) by mouth at bedtime as needed for sleep. (Patient not taking: Reported on 11/28/2014) 30 tablet 0   No current facility-administered medications for this visit.   Facility-Administered Medications Ordered in Other Visits  Medication Dose Route Frequency Provider Last Rate Last Dose  . sodium chloride 0.9 % injection 10 mL  10 mL Intravenous PRN Heath Lark, MD   10 mL at 11/28/14 1054    PHYSICAL EXAMINATION: ECOG PERFORMANCE STATUS: 0 - Asymptomatic  Filed Vitals:   Filed Weights   11/28/14 1102  Weight: 221 lb 1.6 oz (100.29 kg)    GENERAL:alert, no distress and comfortable SKIN: skin color, texture, turgor are normal, no rashes or significant lesions EYES: normal, Conjunctiva are pink and non-injected, sclera clear OROPHARYNX:no exudate, no erythema  and lips, buccal mucosa, and tongue normal  NECK: supple, thyroid normal size, non-tender, without nodularity LYMPH:  no palpable lymphadenopathy in the cervical, axillary or inguinal LUNGS: clear to auscultation  and percussion with normal breathing effort HEART: regular rate & rhythm and no murmurs and no lower extremity edema ABDOMEN:abdomen soft, non-tender and normal bowel sounds Musculoskeletal:no cyanosis of digits and no clubbing  NEURO: alert & oriented x 3 with fluent speech, no focal motor/sensory deficits  LABORATORY DATA:  I have reviewed the data as listed    Component Value Date/Time   NA 143 11/28/2014 1035   NA 141 10/01/2014 0545   K 4.0 11/28/2014 1035   K 3.8 10/01/2014 0545   CL 106 10/01/2014 0545   CO2 25 11/28/2014 1035   CO2 25 10/01/2014 0545   GLUCOSE 88 11/28/2014 1035   GLUCOSE 162* 10/01/2014 0545   BUN 9.8 11/28/2014 1035   BUN 10 10/01/2014 0545   CREATININE 0.8 11/28/2014 1035   CREATININE 0.62 11/11/2014 0545   CALCIUM 9.4 11/28/2014 1035   CALCIUM 9.2 10/01/2014 0545   PROT 6.7 11/28/2014 1035   PROT 6.4 10/01/2014 0545   ALBUMIN 3.8 11/28/2014 1035   ALBUMIN 3.2* 10/01/2014 0545   AST 13 11/28/2014 1035   AST 7 10/01/2014 0545   ALT 14 11/28/2014 1035   ALT 7 10/01/2014 0545   ALKPHOS 143 11/28/2014 1035   ALKPHOS 83 10/01/2014 0545   BILITOT 0.21 11/28/2014 1035   BILITOT <0.2* 10/01/2014 0545   GFRNONAA >90 11/11/2014 0545   GFRAA >90 11/11/2014 0545    No results found for: SPEP, UPEP  Lab Results  Component Value Date   WBC 21.8* 11/28/2014   NEUTROABS 19.0* 11/28/2014   HGB 12.1 11/28/2014   HCT 38.3 11/28/2014   MCV 86.1 11/28/2014   PLT 244 11/28/2014      Chemistry      Component Value Date/Time   NA 143 11/28/2014 1035   NA 141 10/01/2014 0545   K 4.0 11/28/2014 1035   K 3.8 10/01/2014 0545   CL 106 10/01/2014 0545   CO2 25 11/28/2014 1035   CO2 25 10/01/2014 0545   BUN 9.8 11/28/2014 1035   BUN 10 10/01/2014 0545   CREATININE 0.8 11/28/2014 1035   CREATININE 0.62 11/11/2014 0545      Component Value Date/Time   CALCIUM 9.4 11/28/2014 1035   CALCIUM 9.2 10/01/2014 0545   ALKPHOS 143 11/28/2014 1035   ALKPHOS 83  10/01/2014 0545   AST 13 11/28/2014 1035   AST 7 10/01/2014 0545   ALT 14 11/28/2014 1035   ALT 7 10/01/2014 0545   BILITOT 0.21 11/28/2014 1035   BILITOT <0.2* 10/01/2014 0545      ASSESSMENT & PLAN:  Diffuse large B cell lymphoma She tolerated cycle 4 very well. We will proceed with cycle 5, conventional way without dose adjustment. She has extensive stage IV disease with high-intermediate prognostic score (IPI=3).  She will proceed with treatment along without further doses of intrathecal methotrexate. She will need monthly Lupron injection to prevent menstruation and preserve fertility, next due 12/08/14. She will be admitted to the hospital on 12/01/2013. After discharge, she will return to the [facility] on a weekly basis for supportive care. On 12/01/2013, she will receive Procrit injection along with Neulasta.     Patient is Jehov[employer]'s Witness Due to her religious belief, she will not received blood products. She will receive Procrit injection weekly. So far, she has no  signs of anemia.     Hyperuricemia Cause is unknown. I will give her 1 dose of rasburicase next week along with IV fluids   Neuropathy due to chemotherapeutic drug I will change her chemotherapy dose due to recent neuropathy by reducing the dose of vincristine      No orders of the defined types were placed in this encounter.   All questions were answered. The patient knows to call the clinic with any problems, questions or concerns. No barriers to learning was detected. I spent 40 minutes counseling the patient face to face. The total time spent in the appointment was 60 minutes and more than 50% was on counseling and review of test results     [facility], [place], MD 11/28/2014 4:48 PM

## 2014-11-28 NOTE — Assessment & Plan Note (Signed)
Cause is unknown. I will give her 1 dose of rasburicase next week along with IV fluids

## 2014-11-28 NOTE — Assessment & Plan Note (Signed)
Due to her religious belief, she will not received blood products. She will receive Procrit injection weekly. So far, she has no signs of anemia.

## 2014-11-28 NOTE — Assessment & Plan Note (Signed)
She tolerated cycle 4 very well. We will proceed with cycle 5, conventional way without dose adjustment. She has extensive stage IV disease with high-intermediate prognostic score (IPI=3).  She will proceed with treatment along without further doses of intrathecal methotrexate. She will need monthly Lupron injection to prevent menstruation and preserve fertility, next due 12/08/14. She will be admitted to the hospital on 12/01/2013. After discharge, she will return to the [facility] on a weekly basis for supportive care. On 12/01/2013, she will receive Procrit injection along with Neulasta.

## 2014-11-28 NOTE — Assessment & Plan Note (Signed)
I will change her chemotherapy dose due to recent neuropathy by reducing the dose of vincristine

## 2014-11-28 NOTE — Telephone Encounter (Signed)
gv adn printed appt sched and avs for pt for Jan and Feb... °

## 2014-11-29 LAB — PREGNANCY, URINE: PREG TEST UR: NEGATIVE

## 2014-12-01 ENCOUNTER — Encounter (HOSPITAL_COMMUNITY): Payer: Self-pay

## 2014-12-01 ENCOUNTER — Other Ambulatory Visit: Payer: Self-pay | Admitting: Hematology and Oncology

## 2014-12-01 ENCOUNTER — Inpatient Hospital Stay (HOSPITAL_COMMUNITY): Payer: Medicaid Other

## 2014-12-01 ENCOUNTER — Inpatient Hospital Stay (HOSPITAL_COMMUNITY)
Admission: RE | Admit: 2014-12-01 | Discharge: 2014-12-05 | DRG: 847 | Disposition: A | Discharge status: Home / Self Care | Payer: Medicaid Other | Source: Ambulatory Visit | Attending: Hematology and Oncology | Admitting: Hematology and Oncology

## 2014-12-01 DIAGNOSIS — G620 Drug-induced polyneuropathy: Secondary | ICD-10-CM | POA: Diagnosis present

## 2014-12-01 DIAGNOSIS — G4700 Insomnia, unspecified: Secondary | ICD-10-CM | POA: Diagnosis present

## 2014-12-01 DIAGNOSIS — S93401A Sprain of unspecified ligament of right ankle, initial encounter: Secondary | ICD-10-CM | POA: Diagnosis present

## 2014-12-01 DIAGNOSIS — Z8572 Personal history of non-Hodgkin lymphomas: Secondary | ICD-10-CM | POA: Diagnosis present

## 2014-12-01 DIAGNOSIS — K5900 Constipation, unspecified: Secondary | ICD-10-CM | POA: Diagnosis present

## 2014-12-01 DIAGNOSIS — M25571 Pain in right ankle and joints of right foot: Secondary | ICD-10-CM | POA: Diagnosis present

## 2014-12-01 DIAGNOSIS — C8330 Diffuse large B-cell lymphoma, unspecified site: Secondary | ICD-10-CM | POA: Diagnosis present

## 2014-12-01 DIAGNOSIS — T451X5A Adverse effect of antineoplastic and immunosuppressive drugs, initial encounter: Secondary | ICD-10-CM | POA: Diagnosis present

## 2014-12-01 DIAGNOSIS — Z91048 Other nonmedicinal substance allergy status: Secondary | ICD-10-CM

## 2014-12-01 DIAGNOSIS — R350 Frequency of micturition: Secondary | ICD-10-CM | POA: Diagnosis present

## 2014-12-01 DIAGNOSIS — D72829 Elevated white blood cell count, unspecified: Secondary | ICD-10-CM | POA: Diagnosis present

## 2014-12-01 DIAGNOSIS — Z5111 Encounter for antineoplastic chemotherapy: Principal | ICD-10-CM

## 2014-12-01 DIAGNOSIS — M79671 Pain in right foot: Secondary | ICD-10-CM

## 2014-12-01 DIAGNOSIS — E790 Hyperuricemia without signs of inflammatory arthritis and tophaceous disease: Secondary | ICD-10-CM | POA: Diagnosis present

## 2014-12-01 LAB — URINALYSIS, ROUTINE W REFLEX MICROSCOPIC
Bilirubin Urine: NEGATIVE
Glucose, UA: NEGATIVE mg/dL
HGB URINE DIPSTICK: NEGATIVE
Ketones, ur: NEGATIVE mg/dL
Leukocytes, UA: NEGATIVE
NITRITE: NEGATIVE
PROTEIN: NEGATIVE mg/dL
Specific Gravity, Urine: 1.021 (ref 1.005–1.030)
UROBILINOGEN UA: 0.2 mg/dL (ref 0.0–1.0)
pH: 5.5 (ref 5.0–8.0)

## 2014-12-01 MED ORDER — HOT PACK MISC ONCOLOGY
1.0000 | Freq: Once | Status: AC | PRN
  Filled 2014-12-01: qty 1

## 2014-12-01 MED ORDER — VINCRISTINE SULFATE CHEMO INJECTION 1 MG/ML
INTRAVENOUS | Status: AC
  Administered 2014-12-01 – 2014-12-04 (×4): via INTRAVENOUS
  Filled 2014-12-01 (×4): qty 10

## 2014-12-01 MED ORDER — SODIUM CHLORIDE 0.9 % IV SOLN
3.0000 mg | Freq: Once | INTRAVENOUS | Status: AC
  Administered 2014-12-01: 3 mg via INTRAVENOUS
  Filled 2014-12-01: qty 2

## 2014-12-01 MED ORDER — ONDANSETRON HCL 4 MG PO TABS: 4.0000 mg | ORAL_TABLET | Freq: Three times a day (TID) | ORAL | Status: DC | PRN

## 2014-12-01 MED ORDER — COLD PACK MISC ONCOLOGY
1.0000 | Freq: Once | Status: AC | PRN
  Filled 2014-12-01: qty 1

## 2014-12-01 MED ORDER — ONDANSETRON 8 MG PO TBDP: 4.0000 mg | ORAL_TABLET | Freq: Three times a day (TID) | ORAL | Status: DC | PRN

## 2014-12-01 MED ORDER — EPINEPHRINE HCL 0.1 MG/ML IJ SOSY: 0.2500 mg | PREFILLED_SYRINGE | Freq: Once | INTRAMUSCULAR | Status: AC | PRN

## 2014-12-01 MED ORDER — DIPHENHYDRAMINE HCL 50 MG/ML IJ SOLN: 25.0000 mg | Freq: Once | INTRAMUSCULAR | Status: AC | PRN

## 2014-12-01 MED ORDER — SODIUM CHLORIDE 0.9 % IV SOLN: INTRAVENOUS | Status: DC

## 2014-12-01 MED ORDER — SODIUM CHLORIDE 0.9 % IJ SOLN: 3.0000 mL | INTRAMUSCULAR | Status: DC | PRN

## 2014-12-01 MED ORDER — ALLOPURINOL 300 MG PO TABS
300.0000 mg | ORAL_TABLET | Freq: Every day | ORAL | Status: DC
  Administered 2014-12-02 – 2014-12-05 (×4): 300 mg via ORAL
  Filled 2014-12-01 (×4): qty 1

## 2014-12-01 MED ORDER — ACETAMINOPHEN 325 MG PO TABS
650.0000 mg | ORAL_TABLET | Freq: Once | ORAL | Status: AC
  Administered 2014-12-01: 650 mg via ORAL
  Filled 2014-12-01: qty 2

## 2014-12-01 MED ORDER — DIPHENHYDRAMINE HCL 50 MG PO CAPS
50.0000 mg | ORAL_CAPSULE | Freq: Once | ORAL | Status: AC
  Administered 2014-12-01: 50 mg via ORAL
  Filled 2014-12-01: qty 1

## 2014-12-01 MED ORDER — ALTEPLASE 2 MG IJ SOLR: 2.0000 mg | Freq: Once | INTRAMUSCULAR | Status: AC | PRN

## 2014-12-01 MED ORDER — SODIUM CHLORIDE 0.9 % IJ SOLN: 10.0000 mL | INTRAMUSCULAR | Status: DC | PRN

## 2014-12-01 MED ORDER — ACETAMINOPHEN 325 MG PO TABS: 650.0000 mg | ORAL_TABLET | ORAL | Status: DC | PRN

## 2014-12-01 MED ORDER — HEPARIN SOD (PORK) LOCK FLUSH 100 UNIT/ML IV SOLN: 500.0000 [IU] | Freq: Once | INTRAVENOUS | Status: AC | PRN

## 2014-12-01 MED ORDER — EPINEPHRINE HCL 1 MG/ML IJ SOLN: 0.5000 mg | Freq: Once | INTRAMUSCULAR | Status: AC | PRN

## 2014-12-01 MED ORDER — DIPHENHYDRAMINE HCL 50 MG/ML IJ SOLN: 50.0000 mg | Freq: Once | INTRAMUSCULAR | Status: AC | PRN

## 2014-12-01 MED ORDER — METHYLPREDNISOLONE SODIUM SUCC 125 MG IJ SOLR: 125.0000 mg | Freq: Once | INTRAMUSCULAR | Status: AC | PRN

## 2014-12-01 MED ORDER — SODIUM CHLORIDE 0.9 % IV SOLN: Freq: Once | INTRAVENOUS | Status: AC

## 2014-12-01 MED ORDER — SODIUM CHLORIDE 0.9 % IV SOLN
INTRAVENOUS | Status: AC
  Administered 2014-12-01 – 2014-12-04 (×4): 8 mg via INTRAVENOUS
  Filled 2014-12-01 (×5): qty 4

## 2014-12-01 MED ORDER — RITUXIMAB CHEMO INJECTION 100 MG/10ML
375.0000 mg/m2 | Freq: Once | INTRAVENOUS | Status: AC
  Administered 2014-12-01: 800 mg via INTRAVENOUS
  Filled 2014-12-01: qty 80

## 2014-12-01 MED ORDER — SODIUM CHLORIDE 0.9 % IV SOLN: Freq: Once | INTRAVENOUS | Status: AC | PRN

## 2014-12-01 MED ORDER — ONDANSETRON 8 MG/NS 50 ML IVPB: 8.0000 mg | Freq: Three times a day (TID) | INTRAVENOUS | Status: DC | PRN

## 2014-12-01 MED ORDER — HEPARIN SOD (PORK) LOCK FLUSH 100 UNIT/ML IV SOLN: 250.0000 [IU] | Freq: Once | INTRAVENOUS | Status: AC | PRN

## 2014-12-01 MED ORDER — ALUM & MAG HYDROXIDE-SIMETH 200-200-20 MG/5ML PO SUSP: 60.0000 mL | ORAL | Status: DC | PRN

## 2014-12-01 MED ORDER — FAMOTIDINE IN NACL 20-0.9 MG/50ML-% IV SOLN: 20.0000 mg | Freq: Once | INTRAVENOUS | Status: AC | PRN

## 2014-12-01 MED ORDER — ONDANSETRON HCL 4 MG/2ML IJ SOLN: 4.0000 mg | Freq: Three times a day (TID) | INTRAMUSCULAR | Status: DC | PRN

## 2014-12-01 MED ORDER — SODIUM CHLORIDE 0.9 % IV SOLN
INTRAVENOUS | Status: DC
  Administered 2014-12-01: 10:00:00 via INTRAVENOUS
  Administered 2014-12-02: 1000 mL via INTRAVENOUS
  Administered 2014-12-03 (×2): via INTRAVENOUS
  Administered 2014-12-04: 1000 mL via INTRAVENOUS

## 2014-12-01 MED ORDER — ALBUTEROL SULFATE (2.5 MG/3ML) 0.083% IN NEBU: 2.5000 mg | INHALATION_SOLUTION | Freq: Once | RESPIRATORY_TRACT | Status: AC | PRN

## 2014-12-01 MED ORDER — ENOXAPARIN SODIUM 40 MG/0.4ML ~~LOC~~ SOLN
40.0000 mg | SUBCUTANEOUS | Status: DC
  Administered 2014-12-01 – 2014-12-03 (×3): 40 mg via SUBCUTANEOUS
  Filled 2014-12-01 (×5): qty 0.4

## 2014-12-01 NOTE — Progress Notes (Signed)
Counselor met briefly with patient. Pt reported she was doing well and there has not yet been any negative side effects with this round of chemo. Friends have been supportive to her during this time, and she is hopeful they will visit sometime today. Counselor offered to follow up with pt while she is here until Friday.  Lindsey Irwin Counseling Intern  [phone]

## 2014-12-01 NOTE — Progress Notes (Signed)
Dosing verified for Rituxin via manual calculation with Elige Radon.

## 2014-12-01 NOTE — Progress Notes (Signed)
Chemotherapy dosing for Doxorubicin, Etoposide, and Vincristine verified by manual calculation with Elige Radon RN

## 2014-12-01 NOTE — Progress Notes (Signed)
Patient tolerated chemo well, picc line remained with good blood return, site unremarkable.

## 2014-12-01 NOTE — H&P (Signed)
[place]  [phone]    ADMISSION NOTE Attending MD:  Heath Lark, MD  Reason for Admission: Cycle 5 of Chemotherapy with R-EPOCH for the treatment of Diffuse, large B Cell Lymphoma  HPI: Lindsey Irwin is an 19 y.o. female with a history of Diffuse large B Cell Lymphoma, admitted to the hospital for the initiation of Cycle 5 of Chemotherapy with R-EPOCH. She feels well. Afebrile. She denies chills or night sweats. Denies shortness of breath or chest pain. Denies nausea, vomiting, diarrhea or mucositis. Appetite is normal. Denies constipation,last bowel movement today. Denies any headaches, vision changes or dizziness. Denies any rashes. No bleeding issues. Ambulating frequently. No confusion reported. Yesterday, she jumped on a trampoline and sprained her right ankle. She has some mild pain. She complained of mild urinary frequency without any dysuria or hematuria.   Oncology History   Diffuse large B cell lymphoma  Primary site: Lymphoid Neoplasms  Staging method: AJCC 6th Edition  Clinical: Stage IV signed by Heath Lark, MD on 09/03/2014 8:18 PM  Summary: Stage IV IPI of 3: high LDH, >2 extranodal disease and stage IV at presentation       Diffuse large B cell lymphoma   07/30/2014 Imaging She had a bilateral mammogram which came back abnormal. Ultrasound-guided biopsy was performed, suspicious for lymphoma   08/25/2014 Imaging PET/CT scan showed diffuse metastatic cancer involving bilateral breasts, bone, and large abdominal mass   08/28/2014 Surgery She had excisional surgery and removal of right breast mass   08/28/2014 Pathology Results Accession: [id number] right breast mass is consistent with diffuse large B-cell lymphoma with high proliferation index.   09/04/2014 - 09/11/2014 Hospital Admission she is admitted to the hospital for cycle 1 of [place]   09/04/2014 Imaging echocardiogram is normal.    09/04/2014 Procedure she has placement of PICC line.   09/29/14 Hospital admission The patient was admitted to the hospital for cycle 2 of [place]   10/14/2014 Imaging PET-CT scan showed near-complete response to treatment   10/20/2014 Hospital admission The patient was admitted to the hospital for cycle 3 of  [place]   11/10/2014 Hospital admission The patient was admitted to the hospital for cycle 4 of  [name]     PMH:  Past Medical History  Diagnosis Date  . Breast mass 08/2014  . Sore throat 08/25/2014  . Cough 08/2014  . Nasal congestion 08/2014  . Diffuse large B cell lymphoma 09/02/2014  . Insomnia 09/24/2014    Surgeries:  Past Surgical History  Procedure Laterality Date  . No past surgeries    . Breast biopsy Right 08/28/2014    Procedure: RIGHT EXCISIONAL BREAST BIOPSY;  Surgeon: Donnie Mesa, MD;  Location: [place];  Service: General;  Laterality: Right;    Allergies:  Allergies  Allergen Reactions  . Other     NO BLOOD PRODUCTS. PT IS A JEHOVAH WITNESS.  Marland Kitchen Soap Itching    GAIN LAUNDRY DETERGENT Allergic to tide and surf also    Medications:   Prior to Admission:  Prescriptions prior to admission  Medication Sig Dispense Refill Last Dose  . HYDROcodone-acetaminophen (NORCO/VICODIN) 5-325 MG per tablet Take 1 tablet by mouth every 4 (four) hours as needed. (Patient not taking: Reported on 11/03/2014) 40 tablet 0 Not Taking  . ondansetron (ZOFRAN) 8 MG tablet Take 1 tablet (8 mg total) by mouth every 8 (eight) hours as needed for nausea. (Patient not taking: Reported on 11/03/2014) 30 tablet 3 Not Taking  .  prochlorperazine (COMPAZINE) 10 MG tablet Take 1 tablet (10 mg total) by mouth every 6 (six) hours as needed for nausea. (Patient not taking: Reported on 11/03/2014) 30 tablet 3 Not Taking  . zolpidem (AMBIEN) 10 MG tablet Take 1 tablet (10 mg total) by mouth at bedtime as needed for sleep. (Patient not taking:  Reported on 11/03/2014) 30 tablet 0 Not Taking    Review of Systems  Constitutional: Denies fevers, chills or abnormal weight loss Eyes: Denies blurriness of vision Ears nose,mouth, throat, and face: Denies mucositis or sore throat Respiratory: Denies cough, dyspnea or wheezes Cardiovascular: Denies palpitation, chest discomfort or lower extremity swelling Gastrointestinal: Denies nausea, heartburn or change in bowel habits Skin: Denies abnormal skin rashes. Right Upper extremity PICC line non tender, without erythema. Lymphatics: Denies new lymphadenopathy or easy bruising Neurological:Denies numbness, tingling or new weaknesses Behavioral/Psych: Mood is stable, no new changes  All other systems were reviewed with the patient and are negative  Family History: No family history on file.  Social History:  reports that she has never smoked. She has never used smokeless tobacco. She reports that she does not drink alcohol or use illicit drugs.  Physical Exam:  19 y.o. female  in no acute distress, alert, conversant.  GENERAL:alert, no distress and comfortable SKIN: skin color, texture, turgor are normal, no rashes or significant lesions EYES: normal, Conjunctiva are pink and non-injected, sclera clear OROPHARYNX:no exudate, no erythema and lips, buccal mucosa, and tongue normal  NECK: supple, thyroid normal size, non-tender, without nodularity LYMPH: no palpable lymphadenopathy in the cervical, axillary or inguinal LUNGS: clear to auscultation and percussion with normal breathing effort HEART: regular rate & rhythm and no murmurs and no lower extremity edema ABDOMEN:abdomen soft, non-tender and normal bowel sounds Musculoskeletal:no cyanosis of digits and no clubbing  NEURO: alert & oriented x 3 with fluent speech, no focal motor/sensory deficits  PICC line site looks okay. Her right ankle is bandaged. LABS: CBC Latest Ref Rng 11/28/2014 11/11/2014 11/03/2014  WBC 3.9 - 10.3  10e3/uL 21.8(H) 23.4(H) 21.5(H)  Hemoglobin 11.6 - 15.9 g/dL 12.1 12.7 11.8  Hematocrit 34.8 - 46.6 % 38.3 40.2 37.7  Platelets 145 - 400 10e3/uL 244 336 304    Lab Results  Component Value Date   CREATININE 0.8 11/28/2014    A/P: 19 y.o. female   Diffuse large B cell lymphoma She tolerated cycle 4 very well. We will proceed with cycle 5, conventional way without dose adjustment. She has extensive stage IV disease with high-intermediate prognostic score (IPI=3).  She will proceed with treatment along without further doses of intrathecal methotrexate. She will need monthly Lupron injection to prevent menstruation and preserve fertility, next due 12/08/14. She will be admitted to the hospital on 12/01/2013. After discharge, she will return to the [facility] on a weekly basis for supportive care. On 12/08/2013, she will receive Procrit injection along with Neulasta.  Patient is Jehovah's Witness Due to her religious belief, she will not received blood products. She will receive Procrit injection weekly. So far, she has no signs of anemia.  Hyperuricemia Cause is unknown. I will give her 1 dose of rasburicase along with IV fluids  Neuropathy due to chemotherapeutic drug I will change her chemotherapy dose due to recent neuropathy by reducing the dose of vincristine  Right foot pain I will order x-ray to exclude fracture  Urinary frequency I will order urinalysis to exclude UTI  DVT prophylaxis We'll start her on Lovenox  Code status Full  code  [facility], [place], MD 12/01/2014

## 2014-12-02 LAB — URIC ACID: URIC ACID, SERUM: 1.8 mg/dL — AB (ref 2.4–7.0)

## 2014-12-02 NOTE — Progress Notes (Addendum)
Lindsey Irwin   DOB:12/06/1995   JK#:[id number]   [id number]#:[id number]  Patient Care Team: No Pcp Per Patient as PCP - General (General Practice)  I have seen the patient, examined her and edited the notes as follows  Subjective: Patient seen and examined. Tolerated C5 Day 1 of Chemo well.  Afebrile. Denies fever, chills or night sweats. Denies shortness of breath or chest pain. Denies nausea, vomiting, diarrhea or mucositis. Appetite is normal. Last bowel movement on 1/18. Mild dysuria improved. Denies any headaches, vision changes or dizziness. Denies any rashes. No bleeding issues. Ambulating frequently. Her right ankle pain due to a mechanical sprain is resolving. She has mild neuropathy, chemo related. No motor deficits. No confusion reported.  Scheduled Meds: . allopurinol  300 mg Oral Daily  . DOXOrubicin/vinCRIStine/etoposide CHEMO IV infusion for Inpatient CI   Intravenous Q24H  . enoxaparin (LOVENOX) injection  40 mg Subcutaneous Q24H  . ondansetron (ZOFRAN) with dexamethasone (DECADRON) IV   Intravenous Q24H   Continuous Infusions: . sodium chloride    . sodium chloride 1,000 mL (12/02/14 0517)   PRN Meds:.acetaminophen, alum & mag hydroxide-simeth, ondansetron **OR** ondansetron **OR** ondansetron (ZOFRAN) IV **OR** ondansetron (ZOFRAN) IV, sodium chloride, sodium chloride, sodium chloride, sodium chloride   Objective:  Filed Vitals:   12/02/14 0554  BP: 113/61  Pulse: 70  Temp: 97.5 F (36.4 C)  Resp: 18      Intake/Output Summary (Last 24 hours) at 12/02/14 0744 Last data filed at 12/02/14 0520  Gross per 24 hour  Intake 1743.46 ml  Output   1800 ml  Net -56.54 ml    ECOG PERFORMANCE STATUS:0  GENERAL:alert, no distress and comfortable SKIN: skin color, texture, turgor are normal, no rashes or significant lesions. Alopecia noted EYES: normal, conjunctiva are pink and non-injected, sclera clear OROPHARYNX:no exudate, no erythema and lips, buccal mucosa, and tongue  normal  NECK: supple, thyroid normal size, non-tender, without nodularity LYMPH:  no palpable lymphadenopathy in the cervical, axillary or inguinal LUNGS: clear to auscultation and percussion with normal breathing effort HEART: regular rate & rhythm and no murmurs and no lower extremity edema. Right PICC line normal ABDOMEN: soft, non-tender and normal bowel sounds Musculoskeletal:no cyanosis of digits and no clubbing  PSYCH: alert & oriented x 3 with fluent speech NEURO: no focal motor/sensory deficits. Right ankle bandaged, mildly tender    CBG (last 3)  No results for input(s): GLUCAP in the last 72 hours.   Labs:   Recent Labs Lab 11/28/14 1035  WBC 21.8*  HGB 12.1  HCT 38.3  PLT 244  MCV 86.1  MCH 27.2  MCHC 31.6  RDW 17.8*  LYMPHSABS 1.9  MONOABS 0.8  EOSABS 0.1  BASOSABS 0.1     Chemistries:    Recent Labs Lab 11/28/14 1035  NA 143  K 4.0  CO2 25  GLUCOSE 88  BUN 9.8  CREATININE 0.8  CALCIUM 9.4  AST 13  ALT 14  ALKPHOS 143  BILITOT 0.21    GFR Estimated Creatinine Clearance: 134.1 mL/min (by C-G formula based on Cr of 0.8).  Liver Function Tests:  Recent Labs Lab 11/28/14 1035  AST 13  ALT 14  ALKPHOS 143  BILITOT 0.21  PROT 6.7  ALBUMIN 3.8   Urinalysis    Component Value Date/Time   COLORURINE YELLOW 12/01/2014 0940   APPEARANCEUR CLOUDY* 12/01/2014 0940   LABSPEC 1.021 12/01/2014 0940   PHURINE 5.5 12/01/2014 0940   GLUCOSEU NEGATIVE 12/01/2014 0940   HGBUR NEGATIVE 12/01/2014  [place] 12/01/2014 0940   KETONESUR NEGATIVE 12/01/2014 0940   PROTEINUR NEGATIVE 12/01/2014 0940   UROBILINOGEN 0.2 12/01/2014 0940   NITRITE NEGATIVE 12/01/2014 0940   LEUKOCYTESUR NEGATIVE 12/01/2014 0940     Imaging Studies:  Dg Foot Complete Right  12/01/2014   CLINICAL DATA:  Twisted ankle post jumping on trampoline in 12/01/2014  EXAM: RIGHT FOOT COMPLETE - 3+ VIEW  COMPARISON:  None.  FINDINGS: Three views of the right  foot submitted. No acute fracture or subluxation. No radiopaque foreign body.  IMPRESSION: Negative.   Electronically Signed   By: Lahoma Crocker M.D.   On: 12/01/2014 11:06    Assessment/Plan: 19 y.o.  Diffuse large B cell lymphoma She had extensive stage IV disease with high- intermediate prognostic score (IPI=3). She was admitted for infusional R-EPOCH day 1 cycle 5 on 12/01/2014 from day 1-5 and every 21 days, conventional way, with vincristine dose adjustment due to mild neuropathy. She tolerated Rituxan well without toxic side effects. She will receive prophylactic CNS treatment with intrathecal methotrexate under interventional radiology. She received Lupron injection to prevent menstruation and preserve fertility and prevent menstruation. On 12/08/14 she will receive Procrit injection along with Neulasta.  Jehovah's witness Due to her religious beliefs, she will not receive blood products No signs of anemia noticed at this time She will receive Procrit injection weekly as preventive strategy against the need for transfusion. To date, no signs of anemia  DVT prophylaxis She was started on Lovenox, and this can be discontinued if her platelet count drops to less than 50,000  Leukocytosis Due to recent Neulasta (1/11), in the setting of lymphoma.  She is afebrile No intervention indicated at this time  Right ankle pain Xray of the right foot is negative for fracture or subluxation Continue supportive care  Urinary frequency Urinalysis is negative Today symptoms improve. She may push more oral fluids.  If symptomatic, pyridium may be consider for relief.   Neuropathy due to chemotherapeutic drug Her dose was changed due to recent neuropathy, by reducing vincristine.   Discharge planning Anticipated discharge on Friday 12/05/14 after chemo completion and if patient is clinically stable  Full Code   Rondel Jumbo, PA-C 12/02/2014  7:44 AM Jamesrobert Ohanesian, MD 12/02/2014

## 2014-12-03 DIAGNOSIS — K5900 Constipation, unspecified: Secondary | ICD-10-CM

## 2014-12-03 NOTE — Progress Notes (Signed)
Braxton Feathers RN and I verified math on dosages for patient's chemo. We also reviewed the chemo bag for correct person, MRN, and expiration date/time.

## 2014-12-03 NOTE — Progress Notes (Signed)
Lindsey Irwin   DOB:[birth year]   WU#:[id number]   GNF#:[id number]  Patient Care Team: No Pcp Per Patient as PCP - General (General Practice)  I have seen the patient, examined her and edited the notes as follows  Subjective: Patient seen and examined. Tolerated C5 Day 2 of Chemo well.  Afebrile. Denies fever, chills or night sweats. Denies shortness of breath or chest pain. Denies nausea, vomiting, diarrhea or mucositis. Appetite is normal. Last bowel movement on 1/18. Mild dysuria resolved. Denies any headaches, vision changes or dizziness. Denies any rashes. No bleeding issues. She has not been ambulating. Her right ankle pain due to a mechanical sprain is resolving. She has mild neuropathy, chemo related. No motor deficits. No confusion reported.  Scheduled Meds: . allopurinol  300 mg Oral Daily  . DOXOrubicin/vinCRIStine/etoposide CHEMO IV infusion for Inpatient CI   Intravenous Q24H  . enoxaparin (LOVENOX) injection  40 mg Subcutaneous Q24H  . ondansetron (ZOFRAN) with dexamethasone (DECADRON) IV   Intravenous Q24H   Continuous Infusions: . sodium chloride    . sodium chloride 50 mL/hr at 12/03/14 0037   PRN Meds:.acetaminophen, alum & mag hydroxide-simeth, ondansetron **OR** ondansetron **OR** ondansetron (ZOFRAN) IV **OR** ondansetron (ZOFRAN) IV, sodium chloride, sodium chloride, sodium chloride, sodium chloride   Objective:  Filed Vitals:   12/03/14 0509  BP: 125/69  Pulse: 66  Temp: 97.5 F (36.4 C)  Resp: 18      Intake/Output Summary (Last 24 hours) at 12/03/14 0716 Last data filed at 12/03/14 0509  Gross per 24 hour  Intake    600 ml  Output   2450 ml  Net  -1850 ml    ECOG PERFORMANCE STATUS:1  GENERAL:alert, no distress and comfortable SKIN: skin color, texture, turgor are normal, no rashes or significant lesions. Alopecia noted EYES: normal, conjunctiva are pink and non-injected, sclera clear OROPHARYNX:no exudate, no erythema and lips, buccal mucosa, and  tongue normal  NECK: supple, thyroid normal size, non-tender, without nodularity LYMPH:  no palpable lymphadenopathy in the cervical, axillary or inguinal LUNGS:trace of crackles at the bases without rhonchi or rales. Normal breathing effort HEART: regular rate & rhythm and no murmurs and no lower extremity edema. Right PICC line normal ABDOMEN: soft, non-tender and normal bowel sounds Musculoskeletal:no cyanosis of digits and no clubbing  PSYCH: alert & oriented x 3 with fluent speech NEURO: no focal motor deficits;mild neuropathy. Right ankle bandaged, mildly tender    CBG (last 3)  No results for input(s): GLUCAP in the last 72 hours.   Labs:   Recent Labs Lab 11/28/14 1035  WBC 21.8*  HGB 12.1  HCT 38.3  PLT 244  MCV 86.1  MCH 27.2  MCHC 31.6  RDW 17.8*  LYMPHSABS 1.9  MONOABS 0.8  EOSABS 0.1  BASOSABS 0.1     Chemistries:    Recent Labs Lab 11/28/14 1035  NA 143  K 4.0  CO2 25  GLUCOSE 88  BUN 9.8  CREATININE 0.8  CALCIUM 9.4  AST 13  ALT 14  ALKPHOS 143  BILITOT 0.21    GFR Estimated Creatinine Clearance: 134.1 mL/min (by C-G formula based on Cr of 0.8).  Liver Function Tests:  Recent Labs Lab 11/28/14 1035  AST 13  ALT 14  ALKPHOS 143  BILITOT 0.21  PROT 6.7  ALBUMIN 3.8   Urinalysis    Component Value Date/Time   COLORURINE YELLOW 12/01/2014 0940   APPEARANCEUR CLOUDY* 12/01/2014 0940   LABSPEC 1.021 12/01/2014 0940   PHURINE 5.5 12/01/2014  [place] 12/01/2014 0940   HGBUR NEGATIVE 12/01/2014 0940   BILIRUBINUR NEGATIVE 12/01/2014 0940   KETONESUR NEGATIVE 12/01/2014 0940   PROTEINUR NEGATIVE 12/01/2014 0940   UROBILINOGEN 0.2 12/01/2014 0940   NITRITE NEGATIVE 12/01/2014 0940   LEUKOCYTESUR NEGATIVE 12/01/2014 0940     Imaging Studies:  Dg Foot Complete Right  12/01/2014   CLINICAL DATA:  Twisted ankle post jumping on trampoline in 12/01/2014  EXAM: RIGHT FOOT COMPLETE - 3+ VIEW  COMPARISON:  None.   FINDINGS: Three views of the right foot submitted. No acute fracture or subluxation. No radiopaque foreign body.  IMPRESSION: Negative.   Electronically Signed   By: Lahoma Crocker M.D.   On: 12/01/2014 11:06    Assessment/Plan: 19 y.o.  Diffuse large B cell lymphoma She had extensive stage IV disease with high- intermediate prognostic score (IPI=3). She was admitted for infusional R-EPOCH day 1 cycle 5 on 12/01/2014 from day 1-5 and every 21 days, conventional way, with vincristine dose adjustment due to mild neuropathy. She tolerated Rituxan well without toxic side effects, as well as day 2 chemo. She will receive prophylactic CNS treatment with intrathecal methotrexate under interventional radiology. She received Lupron injection to prevent menstruation and preserve fertility and prevent menstruation. On 12/08/14 she will receive Procrit injection along with Neulasta.  Jehovah's witness Due to her religious beliefs, she will not receive blood products No signs of anemia noticed at this time She will receive Procrit injection weekly as preventive strategy against the need for transfusion. To date, no signs of anemia  DVT prophylaxis She was started on Lovenox, and this can be discontinued if her platelet count drops to less than 50,000  Leukocytosis Due to recent Neulasta (1/11), in the setting of lymphoma.  She is afebrile No intervention indicated at this time  Right ankle pain Xray of the right foot is negative for fracture or subluxation Pain is improving May need OT/PT to help with ambulation.  Continue supportive care  Urinary frequency Urinalysis is negative Symptoms resolved. She may push more oral fluids.  Constipation In the setting of decreased mobility due to ankle pain Provide Senokot today if no bowel movement this morning Consider OT/PT  Neuropathy due to chemotherapeutic drug Her dose was changed due to recent neuropathy, by reducing vincristine with  improvement of symptoms.   Discharge planning Anticipated discharge on Friday 12/05/14 after chemo completion and if patient is clinically stable  Full Code  Rondel Jumbo, PA-C 12/03/2014  7:16 AM Quana Chamberlain, MD 12/03/2014

## 2014-12-04 NOTE — Plan of Care (Signed)
Problem: Phase II Progression Outcomes Goal: Progress activity as tolerated unless otherwise ordered Outcome: Completed/Met Date Met:  12/04/14 Ambulated in hallway with friends.

## 2014-12-04 NOTE — Progress Notes (Signed)
BSA and chemotherapy dosages calculated and confirmed by myself and Trenda Moots, RN.  Zandra Abts [facility]  12/04/2014  1:37 PM

## 2014-12-04 NOTE — Progress Notes (Signed)
Lindsey Irwin   DOB:[birth year]   YW#:[id number]   SWN#:[id number]  Patient Care Team: No Pcp Per Patient as PCP - General (General Practice)  I have seen the patient, examined her and edited the notes as follows  Subjective: Patient seen and examined. Tolerated C5 Day 3 of Chemo well.  Afebrile. Denies fever, chills or night sweats. Denies shortness of breath or chest pain. Denies nausea, vomiting, diarrhea or mucositis. Appetite is normal. Last bowel movement on 1/20. Denies dysuria. Denies any headaches, vision changes or dizziness. Denies any rashes. No bleeding issues. She has not been ambulating with assistance. Her right ankle pain due to a mechanical sprain is resolving. She has known mild neuropathy due to chemo. No motor deficits. No confusion reported.  Scheduled Meds: . allopurinol  300 mg Oral Daily  . DOXOrubicin/vinCRIStine/etoposide CHEMO IV infusion for Inpatient CI   Intravenous Q24H  . enoxaparin (LOVENOX) injection  40 mg Subcutaneous Q24H  . ondansetron (ZOFRAN) with dexamethasone (DECADRON) IV   Intravenous Q24H   Continuous Infusions: . sodium chloride    . sodium chloride 50 mL/hr at 12/03/14 2013   PRN Meds:.acetaminophen, alum & mag hydroxide-simeth, ondansetron **OR** ondansetron **OR** ondansetron (ZOFRAN) IV **OR** ondansetron (ZOFRAN) IV, sodium chloride, sodium chloride, sodium chloride, sodium chloride   Objective:  Filed Vitals:   12/04/14 0628  BP: 126/66  Pulse: 57  Temp: 97.7 F (36.5 C)  Resp: 20      Intake/Output Summary (Last 24 hours) at 12/04/14 0747 Last data filed at 12/04/14 0630  Gross per 24 hour  Intake    360 ml  Output   2200 ml  Net  -1840 ml    ECOG PERFORMANCE STATUS:1  GENERAL:alert, no distress and comfortable SKIN: skin color, texture, turgor are normal, no rashes or significant lesions. Alopecia noted EYES: normal, conjunctiva are pink and non-injected, sclera clear OROPHARYNX:no exudate, no erythema and lips, buccal  mucosa, and tongue normal  NECK: supple, thyroid normal size, non-tender, without nodularity LYMPH:  no palpable lymphadenopathy in the cervical, axillary or inguinal LUNGS:clear to auscultation without rhonchi or rales, no wheezing. Normal breathing effort HEART: regular rate & rhythm and no murmurs and no lower extremity edema. Right PICC line normal ABDOMEN: soft, non-tender and normal bowel sounds Musculoskeletal:no cyanosis of digits and no clubbing  PSYCH: alert & oriented x 3 with fluent speech NEURO: no focal motor deficits; mild neuropathy. Right ankle bandaged, mildly tender    CBG (last 3)  No results for input(s): GLUCAP in the last 72 hours.   Labs:   Recent Labs Lab 11/28/14 1035  WBC 21.8*  HGB 12.1  HCT 38.3  PLT 244  MCV 86.1  MCH 27.2  MCHC 31.6  RDW 17.8*  LYMPHSABS 1.9  MONOABS 0.8  EOSABS 0.1  BASOSABS 0.1     Chemistries:    Recent Labs Lab 11/28/14 1035  NA 143  K 4.0  CO2 25  GLUCOSE 88  BUN 9.8  CREATININE 0.8  CALCIUM 9.4  AST 13  ALT 14  ALKPHOS 143  BILITOT 0.21    GFR Estimated Creatinine Clearance: 134.1 mL/min (by C-G formula based on Cr of 0.8).  Liver Function Tests:  Recent Labs Lab 11/28/14 1035  AST 13  ALT 14  ALKPHOS 143  BILITOT 0.21  PROT 6.7  ALBUMIN 3.8   Urinalysis    Component Value Date/Time   COLORURINE YELLOW 12/01/2014 0940   APPEARANCEUR CLOUDY* 12/01/2014 0940   LABSPEC 1.021 12/01/2014 0940  PHURINE 5.5 12/01/2014 0940   GLUCOSEU NEGATIVE 12/01/2014 0940   HGBUR NEGATIVE 12/01/2014 0940   BILIRUBINUR NEGATIVE 12/01/2014 0940   KETONESUR NEGATIVE 12/01/2014 0940   PROTEINUR NEGATIVE 12/01/2014 0940   UROBILINOGEN 0.2 12/01/2014 0940   NITRITE NEGATIVE 12/01/2014 0940   LEUKOCYTESUR NEGATIVE 12/01/2014 0940     Imaging Studies:  No results found.  Assessment/Plan: 19 y.o.  Diffuse large B cell lymphoma She had extensive stage IV disease with high- intermediate prognostic score  (IPI=3). She was admitted for infusional R-EPOCH day 1 cycle 5 on 12/01/2014 from day 1-5 and every 21 days, conventional way, with vincristine dose adjustment due to mild neuropathy. She received Lupron injection to prevent menstruation and preserve fertility and prevent menstruation. On 12/08/14 she will receive Procrit injection along with Neulasta.  Jehovah's witness Due to her religious beliefs, she will not receive blood products No signs of anemia noticed at this time She will receive Procrit injection weekly as preventive strategy against the need for transfusion. To date, no signs of anemia  DVT prophylaxis She was started on Lovenox, and this can be discontinued if her platelet count drops to less than 50,000  Leukocytosis Due to recent Neulasta (1/11), in the setting of lymphoma.  She is afebrile No intervention indicated at this time  Right ankle pain Xray of the right foot is negative for fracture or subluxation Pain is improving Continue supportive care  Urinary frequency Urinalysis is negative Symptoms resolved. Continue to push more oral fluids.  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   Discharge planning Anticipated discharge on Friday 12/05/14 after chemo completion and if patient is clinically stable  Full Code  Rondel Jumbo, PA-C 12/04/2014  7:47 AM Jawanna Dykman, MD 12/04/2014

## 2014-12-05 DIAGNOSIS — Z5112 Encounter for antineoplastic immunotherapy: Secondary | ICD-10-CM

## 2014-12-05 DIAGNOSIS — D72829 Elevated white blood cell count, unspecified: Secondary | ICD-10-CM

## 2014-12-05 MED ORDER — CYCLOPHOSPHAMIDE CHEMO INJECTION 1 GM
750.0000 mg/m2 | Freq: Once | INTRAMUSCULAR | Status: AC
  Administered 2014-12-05: 1520 mg via INTRAVENOUS
  Filled 2014-12-05: qty 76

## 2014-12-05 MED ORDER — HEPARIN SOD (PORK) LOCK FLUSH 100 UNIT/ML IV SOLN
250.0000 [IU] | INTRAVENOUS | Status: DC | PRN
  Filled 2014-12-05: qty 5

## 2014-12-05 MED ORDER — SODIUM CHLORIDE 0.9 % IV SOLN
Freq: Once | INTRAVENOUS | Status: AC
  Administered 2014-12-05: 36 mg via INTRAVENOUS
  Filled 2014-12-05: qty 8

## 2014-12-05 NOTE — Progress Notes (Signed)
BSA and Cytoxan dose calculated and confirmed by myself and Laural Benes, RN.  Zandra Abts [facility]  12/05/2014  9:31 AM

## 2014-12-05 NOTE — Progress Notes (Signed)
Lindsey Irwin   DOB:June 18, 1996   MH#:[id number]   XQJ#:[id number]  Patient Care Team: No Pcp Per Patient as PCP - General (General Practice)  I have seen the patient, examined her and edited the notes as follows  Subjective: Patient seen and examined. Tolerated C5 Day 4 of Chemo well.  Afebrile. Denies fever, chills or night sweats. Denies shortness of breath or chest pain. Denies nausea, vomiting, diarrhea or mucositis. Appetite is normal. Last bowel movement on 1/21. Denies dysuria. Denies any headaches, vision changes or dizziness. Denies any rashes. No bleeding issues. She has not been ambulating with assistance. Her right ankle pain due to a mechanical sprain is resolving. She has known mild neuropathy due to chemo. No motor deficits. No confusion reported.  Scheduled Meds: . allopurinol  300 mg Oral Daily  . cyclophosphamide  750 mg/m2 (Treatment Plan Actual) Intravenous Once  . DOXOrubicin/vinCRIStine/etoposide CHEMO IV infusion for Inpatient CI   Intravenous Q24H  . enoxaparin (LOVENOX) injection  40 mg Subcutaneous Q24H  . ondansetron (ZOFRAN) with dexamethasone (DECADRON) IV   Intravenous Once   Continuous Infusions: . sodium chloride    . sodium chloride 1,000 mL (12/04/14 2026)   PRN Meds:.acetaminophen, alum & mag hydroxide-simeth, ondansetron **OR** ondansetron **OR** ondansetron (ZOFRAN) IV **OR** ondansetron (ZOFRAN) IV, sodium chloride, sodium chloride, sodium chloride, sodium chloride   Objective:  Filed Vitals:   12/05/14 0615  BP: 126/58  Pulse: 63  Temp: 97.6 F (36.4 C)  Resp: 16      Intake/Output Summary (Last 24 hours) at 12/05/14 0815 Last data filed at 12/05/14 4481  Gross per 24 hour  Intake 6771.69 ml  Output   2850 ml  Net 3921.69 ml    ECOG PERFORMANCE STATUS:1  GENERAL:alert, no distress and comfortable SKIN: skin color, texture, turgor are normal, no rashes or significant lesions. Alopecia noted EYES: normal, conjunctiva are pink and  non-injected, sclera clear OROPHARYNX:no exudate, no erythema and lips, buccal mucosa, and tongue normal  NECK: supple, thyroid normal size, non-tender, without nodularity LYMPH:  no palpable lymphadenopathy in the cervical, axillary or inguinal LUNGS:clear to auscultation without rhonchi or rales, no wheezing. Normal breathing effort HEART: regular rate & rhythm and no murmurs and no lower extremity edema. Right PICC line normal ABDOMEN: soft, non-tender and normal bowel sounds Musculoskeletal:no cyanosis of digits and no clubbing  PSYCH: alert & oriented x 3 with fluent speech NEURO: no focal motor deficits; mild neuropathy. Right ankle bandaged, mildly tender    CBG (last 3)  No results for input(s): GLUCAP in the last 72 hours.   Labs:   Recent Labs Lab 11/28/14 1035  WBC 21.8*  HGB 12.1  HCT 38.3  PLT 244  MCV 86.1  MCH 27.2  MCHC 31.6  RDW 17.8*  LYMPHSABS 1.9  MONOABS 0.8  EOSABS 0.1  BASOSABS 0.1     Chemistries:    Recent Labs Lab 11/28/14 1035  NA 143  K 4.0  CO2 25  GLUCOSE 88  BUN 9.8  CREATININE 0.8  CALCIUM 9.4  AST 13  ALT 14  ALKPHOS 143  BILITOT 0.21    GFR Estimated Creatinine Clearance: 134.1 mL/min (by C-G formula based on Cr of 0.8).  Liver Function Tests:  Recent Labs Lab 11/28/14 1035  AST 13  ALT 14  ALKPHOS 143  BILITOT 0.21  PROT 6.7  ALBUMIN 3.8   Urinalysis    Component Value Date/Time   COLORURINE YELLOW 12/01/2014 0940   APPEARANCEUR CLOUDY* 12/01/2014 0940  LABSPEC 1.021 12/01/2014 0940   PHURINE 5.5 12/01/2014 0940   GLUCOSEU NEGATIVE 12/01/2014 0940   HGBUR NEGATIVE 12/01/2014 0940   BILIRUBINUR NEGATIVE 12/01/2014 0940   KETONESUR NEGATIVE 12/01/2014 0940   PROTEINUR NEGATIVE 12/01/2014 0940   UROBILINOGEN 0.2 12/01/2014 0940   NITRITE NEGATIVE 12/01/2014 0940   LEUKOCYTESUR NEGATIVE 12/01/2014 0940     Imaging Studies:  No results found.  Assessment/Plan: 19 y.o.  Diffuse large B cell  lymphoma She had extensive stage IV disease with high- intermediate prognostic score (IPI=3). She was admitted for infusional R-EPOCH day 1 cycle 5 on 12/01/2014 from day 1-5 and every 21 days, conventional way, with vincristine dose adjustment due to mild neuropathy. She received Lupron injection to prevent menstruation and preserve fertility and prevent menstruation. On 12/08/14 she will receive Procrit injection along with Neulasta.  Jehovah's witness Due to her religious beliefs, she will not receive blood products No signs of anemia noticed at this time She will receive Procrit injection weekly as preventive strategy against the need for transfusion. To date, no signs of anemia  DVT prophylaxis She was started on Lovenox, and this can be discontinued if her platelet count drops to less than 50,000  Leukocytosis Due to recent Neulasta (1/11), in the setting of lymphoma.  She is afebrile No intervention indicated at this time  Right ankle pain Xray of the right foot is negative for fracture or subluxation Pain is resolving Continue supportive care  Urinary frequency, resolved Urinalysis is negative Symptoms resolved. Continue to push more oral fluids.  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   Discharge planning Anticipated discharge on Friday 12/05/14 after chemo completion and if patient is clinically stable  Full Code  WERTMAN,[name], PA-C 12/05/2014  8:15 AM   I have seen the patient, examined her. I agree with the assessment and and plan and have edited the notes.   She is doing well, will be discharged home after finishing chemo. See Dr. Alvy Bimler next week.  Truitt Merle  12/05/2014

## 2014-12-05 NOTE — Discharge Summary (Signed)
Patient ID: Lindsey Irwin MRN: [id number] DOB/AGE: Sep 13, 1996 19 y.o.  Admit date: 12/01/2014 Discharge date: 12/05/2014  Patient Care Team: No Pcp Per Patient as PCP - General (General Practice)   Brief History of Present Illness: For complete details please refer to admission H and P, but in brief, Lindsey Irwin is an 19 y.o. female with a history of Diffuse large B Cell Lymphoma, admitted to the hospital for the initiation of Cycle 5 of Chemotherapy with R-EPOCH.   Discharge Diagnoses/Hospital Course:  Diffuse large B cell lymphoma She had extensive stage IV disease with high- intermediate prognostic score (IPI=3). She was admitted for infusional R-EPOCH day 1 cycle 5 on 12/01/2014 from day 1-5 and every 21 days, conventional way, with vincristine dose adjustment due to mild neuropathy. She received Lupron injection to prevent menstruation and preserve fertility and prevent menstruation. She tolerated therapy without complications On 3/71/06 she will receive Procrit injection along with Neulasta.  Jehovah's witness Due to her religious beliefs, she will not receive blood products No signs of anemia noticed at this time She will receive Procrit injection weekly as preventive strategy against the need for transfusion. To date, no signs of anemia  DVT prophylaxis She was started on Lovenox through hospitalization No bleeding issues reported.  This is to be discontinued on discharge.   Leukocytosis Due to recent Neulasta (1/11), in the setting of lymphoma.  She is afebrile No intervention indicated at this time  Right ankle pain Xray of the right foot is negative for fracture or subluxation Pain is resolving She will continue supportive care and walk with assistance until resolutions of symptoms  Urinary frequency, resolved Urinalysis is negative Symptoms resolved. Continue to push more oral fluids.  Constipation, resolved In the setting of decreased mobility due to  ankle pain Resolved on 1/20  Neuropathy due to chemotherapeutic drug Her dose was changed due to recent neuropathy, by reducing vincristine with improvement of symptoms.   Full Code  Past Medical History  Diagnosis Date  . Breast mass 08/2014  . Sore throat 08/25/2014  . Cough 08/2014  . Nasal congestion 08/2014  . Diffuse large B cell lymphoma 09/02/2014  . Insomnia 09/24/2014    Discharge Medications:    Medication List    STOP taking these medications        PRESCRIPTION MEDICATION     zolpidem 10 MG tablet  Commonly known as:  AMBIEN      TAKE these medications        HYDROcodone-acetaminophen 5-325 MG per tablet  Commonly known as:  NORCO/VICODIN  Take 1 tablet by mouth every 6 (six) hours as needed for moderate pain.     pegfilgrastim 6 MG/0.6ML injection  Commonly known as:  NEULASTA  Inject 6 mg into the skin once.     prochlorperazine 10 MG tablet  Commonly known as:  COMPAZINE  Take 1 tablet (10 mg total) by mouth every 6 (six) hours as needed for nausea.      ASK your doctor about these medications        ondansetron 8 MG tablet  Commonly known as:  ZOFRAN  Take 1 tablet (8 mg total) by mouth every 8 (eight) hours as needed for nausea.        Discharge Condition: Improved.  Diet recommendation:  Regular.   Disposition and Follow-up: On 12/08/14 she will receive Procrit injection along with Neulasta.  PET scan on 2/3, see schedule                                                 2/5 Dr. Alvy Bimler at 2 pm    ECOG PERFORMANCE STATUS:1    Physical Exam at Discharge:  Subjective: Patient seen and examined. Tolerated C5 chemo well. Afebrile. Denies fever, chills or night sweats. Denies shortness of breath or chest pain. Denies nausea, vomiting, diarrhea or mucositis. Appetite is normal. Last bowel movement on 1/21. Denies dysuria. Denies any headaches, vision changes or dizziness. Denies any  rashes. No bleeding issues. She has not been ambulating with assistance. Her right ankle pain due to a mechanical sprain is resolving. She has known mild neuropathy due to chemo. No motor deficits. No confusion reported.  Objective:  BP 126/58 mmHg  Pulse 63  Temp(Src) 97.6 F (36.4 C) (Oral)  Resp 16  Ht 5\' 6"  (1.676 m)  Wt 218 lb (98.884 kg)  BMI 35.20 kg/m2  SpO2 100%  LMP 09/03/2014    GENERAL:alert, no distress and comfortable SKIN: skin color, texture, turgor are normal, no rashes or significant lesions. Alopecia noted EYES: normal, conjunctiva are pink and non-injected, sclera clear OROPHARYNX:no exudate, no erythema and lips, buccal mucosa, and tongue normal  NECK: supple, thyroid normal size, non-tender, without nodularity LYMPH: no palpable lymphadenopathy in the cervical, axillary or inguinal LUNGS:clear to auscultation without rhonchi or rales, no wheezing. Normal breathing effort HEART: regular rate & rhythm and no murmurs and no lower extremity edema. Right PICC line normal ABDOMEN: soft, non-tender and normal bowel sounds Musculoskeletal:no cyanosis of digits and no clubbing  PSYCH: alert & oriented x 3 with fluent speech   Significant Diagnostic Studies:  Dg Foot Complete Right  12/01/2014   CLINICAL DATA:  Twisted ankle post jumping on trampoline in 12/01/2014  EXAM: RIGHT FOOT COMPLETE - 3+ VIEW  COMPARISON:  None.  FINDINGS: Three views of the right foot submitted. No acute fracture or subluxation. No radiopaque foreign body.  IMPRESSION: Negative.   Electronically Signed   By: Lahoma Crocker M.D.   On: 12/01/2014 11:06   Dg Fluoro Guide Spinal/si Jt Inj Left  11/11/2014   CLINICAL DATA:  Diffuse large B-cell lymphoma  EXAM: FLUOROSCOPICALLY GUIDED LUMBAR PUNCTURE FOR INTRATHECAL  CHEMOTHERAPY  TECHNIQUE: Informed consent was obtained from the patient prior to the procedure, including potential complications of headache, allergy, and pain. A 'time out' was  performed. With the patient prone, the lower back was prepped with Betadine. 1% Lidocaine was used for local anesthesia. Lumbar puncture was performed at the L4-L5 level using a 22 gauge needle with return of clear CSF. 12 mg of methotrexate was injected into the subarachnoid space. The patient tolerated the procedure well without apparent complication.  FLUOROSCOPY TIME:  18 seconds  IMPRESSION: Intrathecal injection of chemotherapy without complication   Electronically Signed   By: Lovey Newcomer M.D.   On: 11/11/2014 15:11    Discharge Laboratory Values:  CBC  Recent Labs Lab 11/28/14 1035  WBC 21.8*  HGB 12.1  HCT 38.3  PLT 244  MCV 86.1  MCH 27.2  MCHC 31.6  RDW 17.8*  LYMPHSABS 1.9  MONOABS 0.8  EOSABS 0.1  BASOSABS 0.1     Chemistries   Recent Labs Lab 11/28/14 1035  NA 143  K 4.0  CO2  25  GLUCOSE 88  BUN 9.8  CREATININE 0.8  CALCIUM 9.4     Signed: Sharene Butters, PA-C 12/05/2014, 10:09 AM

## 2014-12-05 NOTE — Progress Notes (Signed)
Pt discharged home with mother in stable condition. Discharge instructions given. Pt and mother verbalized understanding

## 2014-12-08 ENCOUNTER — Ambulatory Visit: Payer: Medicaid Other

## 2014-12-08 ENCOUNTER — Ambulatory Visit (HOSPITAL_BASED_OUTPATIENT_CLINIC_OR_DEPARTMENT_OTHER): Payer: Medicaid Other

## 2014-12-08 DIAGNOSIS — IMO0001 Reserved for inherently not codable concepts without codable children: Secondary | ICD-10-CM

## 2014-12-08 DIAGNOSIS — Z5111 Encounter for antineoplastic chemotherapy: Secondary | ICD-10-CM

## 2014-12-08 DIAGNOSIS — Z789 Other specified health status: Secondary | ICD-10-CM

## 2014-12-08 DIAGNOSIS — C8330 Diffuse large B-cell lymphoma, unspecified site: Secondary | ICD-10-CM

## 2014-12-08 DIAGNOSIS — Z452 Encounter for adjustment and management of vascular access device: Secondary | ICD-10-CM

## 2014-12-08 DIAGNOSIS — Z5189 Encounter for other specified aftercare: Secondary | ICD-10-CM

## 2014-12-08 MED ORDER — EPOETIN ALFA 40000 UNIT/ML IJ SOLN
40000.0000 [IU] | INTRAMUSCULAR | Status: DC
Start: 2014-12-08 — End: 2015-01-19
  Administered 2014-12-08: 40000 [IU] via SUBCUTANEOUS
  Filled 2014-12-08: qty 1

## 2014-12-08 MED ORDER — PEGFILGRASTIM INJECTION 6 MG/0.6ML
6.0000 mg | Freq: Once | SUBCUTANEOUS | Status: AC
  Administered 2014-12-08: 6 mg via SUBCUTANEOUS
  Filled 2014-12-08: qty 0.6

## 2014-12-08 MED ORDER — SODIUM CHLORIDE 0.9 % IJ SOLN
10.0000 mL | INTRAMUSCULAR | Status: DC | PRN
  Administered 2014-12-08: 10 mL via INTRAVENOUS
  Filled 2014-12-08: qty 10

## 2014-12-08 MED ORDER — HEPARIN SOD (PORK) LOCK FLUSH 100 UNIT/ML IV SOLN
500.0000 [IU] | Freq: Once | INTRAVENOUS | Status: AC
  Administered 2014-12-08: 250 [IU] via INTRAVENOUS
  Filled 2014-12-08: qty 5

## 2014-12-08 MED ORDER — LEUPROLIDE ACETATE 7.5 MG IM KIT
7.5000 mg | PACK | INTRAMUSCULAR | Status: DC
Start: 2014-12-08 — End: 2015-01-19
  Administered 2014-12-08: 7.5 mg via INTRAMUSCULAR
  Filled 2014-12-08: qty 7.5

## 2014-12-08 NOTE — Patient Instructions (Signed)
PICC Home Guide A peripherally inserted central catheter (PICC) is a long, thin, flexible tube that is inserted into a vein in the upper arm. It is a form of intravenous (IV) access. It is considered to be a "central" line because the tip of the PICC ends in a large vein in your chest. This large vein is called the superior vena cava (SVC). The PICC tip ends in the SVC because there is a lot of blood flow in the SVC. This allows medicines and IV fluids to be quickly distributed throughout the body. The PICC is inserted using a sterile technique by a specially trained nurse or physician. After the PICC is inserted, a chest X-ray exam is done to be sure it is in the correct place.  A PICC may be placed for different reasons, such as:  To give medicines and liquid nutrition that can only be given through a central line. Examples are:  Certain antibiotic treatments.  Chemotherapy.  Total parenteral nutrition (TPN).  To take frequent blood samples.  To give IV fluids and blood products.  If there is difficulty placing a peripheral intravenous (PIV) catheter. If taken care of properly, a PICC can remain in place for several months. A PICC can also allow a person to go home from the hospital early. Medicine and PICC care can be managed at home by a family member or home health care team. WHAT PROBLEMS CAN HAPPEN WHEN I HAVE A PICC? Problems with a PICC can occasionally occur. These may include the following:  A blood clot (thrombus) forming in or at the tip of the PICC. This can cause the PICC to become clogged. A clot-dissolving medicine called tissue plasminogen activator (tPA) can be given through the PICC to help break up the clot.  Inflammation of the vein (phlebitis) in which the PICC is placed. Signs of inflammation may include redness, pain at the insertion site, red streaks, or being able to feel a "cord" in the vein where the PICC is located.  Infection in the PICC or at the insertion  site. Signs of infection may include fever, chills, redness, swelling, or pus drainage from the PICC insertion site.  PICC movement (malposition). The PICC tip may move from its original position due to excessive physical activity, forceful coughing, sneezing, or vomiting.  A break or cut in the PICC. It is important to not use scissors near the PICC.  Nerve or tendon irritation or injury during PICC insertion. WHAT SHOULD I KEEP IN MIND ABOUT ACTIVITIES WHEN I HAVE A PICC?  You may bend your arm and move it freely. If your PICC is near or at the bend of your elbow, avoid activity with repeated motion at the elbow.  Rest at home for the remainder of the day following PICC line insertion.  Avoid lifting heavy objects as instructed by your health care provider.  Avoid using a crutch with the arm on the same side as your PICC. You may need to use a walker. WHAT SHOULD I KNOW ABOUT MY PICC DRESSING?  Keep your PICC bandage (dressing) clean and dry to prevent infection.  Ask your health care provider when you may shower. Ask your health care provider to teach you how to wrap the PICC when you do take a shower.  Change the PICC dressing as instructed by your health care provider.  Change your PICC dressing if it becomes loose or wet. WHAT SHOULD I KNOW ABOUT PICC CARE?  Check the PICC insertion site   daily for leakage, redness, swelling, or pain.  Do not take a bath, swim, or use hot tubs when you have a PICC. Cover PICC line with clear plastic wrap and tape to keep it dry while showering.  Flush the PICC as directed by your health care provider. Let your health care provider know right away if the PICC is difficult to flush or does not flush. Do not use force to flush the PICC.  Do not use a syringe that is less than 10 mL to flush the PICC.  Never pull or tug on the PICC.  Avoid blood pressure checks on the arm with the PICC.  Keep your PICC identification card with you at all  times.  Do not take the PICC out yourself. Only a trained clinical professional should remove the PICC. SEEK IMMEDIATE MEDICAL CARE IF:  Your PICC is accidentally pulled all the way out. If this happens, cover the insertion site with a bandage or gauze dressing. Do not throw the PICC away. Your health care provider will need to inspect it.  Your PICC was tugged or pulled and has partially come out. Do not  push the PICC back in.  There is any type of drainage, redness, or swelling where the PICC enters the skin.  You cannot flush the PICC, it is difficult to flush, or the PICC leaks around the insertion site when it is flushed.  You hear a "flushing" sound when the PICC is flushed.  You have pain, discomfort, or numbness in your arm, shoulder, or jaw on the same side as the PICC.  You feel your heart "racing" or skipping beats.  You notice a hole or tear in the PICC.  You develop chills or a fever. MAKE SURE YOU:   Understand these instructions.  Will watch your condition.  Will get help right away if you are not doing well or get worse. Document Released: 05/07/2003 Document Revised: 03/17/2014 Document Reviewed: 07/08/2013 Exit[employer] Patient Information 2015 [employer]. This information is not intended to replace advice given to you by your health care provider. Make sure you discuss any questions you have with your health care provider.  

## 2014-12-10 ENCOUNTER — Ambulatory Visit (HOSPITAL_BASED_OUTPATIENT_CLINIC_OR_DEPARTMENT_OTHER): Payer: Medicaid Other

## 2014-12-10 VITALS — BP 124/64 | HR 100 | Temp 98.2°F

## 2014-12-10 DIAGNOSIS — C8330 Diffuse large B-cell lymphoma, unspecified site: Secondary | ICD-10-CM

## 2014-12-10 DIAGNOSIS — Z452 Encounter for adjustment and management of vascular access device: Secondary | ICD-10-CM

## 2014-12-10 MED ORDER — SODIUM CHLORIDE 0.9 % IJ SOLN
10.0000 mL | INTRAMUSCULAR | Status: DC | PRN
  Administered 2014-12-10: 10 mL via INTRAVENOUS
  Filled 2014-12-10: qty 10

## 2014-12-10 MED ORDER — HEPARIN SOD (PORK) LOCK FLUSH 100 UNIT/ML IV SOLN
500.0000 [IU] | Freq: Once | INTRAVENOUS | Status: AC
  Administered 2014-12-10: 500 [IU] via INTRAVENOUS
  Filled 2014-12-10: qty 5

## 2014-12-10 NOTE — Patient Instructions (Signed)
PICC Home Guide A peripherally inserted central catheter (PICC) is a long, thin, flexible tube that is inserted into a vein in the upper arm. It is a form of intravenous (IV) access. It is considered to be a "central" line because the tip of the PICC ends in a large vein in your chest. This large vein is called the superior vena cava (SVC). The PICC tip ends in the SVC because there is a lot of blood flow in the SVC. This allows medicines and IV fluids to be quickly distributed throughout the body. The PICC is inserted using a sterile technique by a specially trained nurse or physician. After the PICC is inserted, a chest X-ray exam is done to be sure it is in the correct place.  A PICC may be placed for different reasons, such as:  To give medicines and liquid nutrition that can only be given through a central line. Examples are:  Certain antibiotic treatments.  Chemotherapy.  Total parenteral nutrition (TPN).  To take frequent blood samples.  To give IV fluids and blood products.  If there is difficulty placing a peripheral intravenous (PIV) catheter. If taken care of properly, a PICC can remain in place for several months. A PICC can also allow a person to go home from the hospital early. Medicine and PICC care can be managed at home by a family member or home health care team. WHAT PROBLEMS CAN HAPPEN WHEN I HAVE A PICC? Problems with a PICC can occasionally occur. These may include the following:  A blood clot (thrombus) forming in or at the tip of the PICC. This can cause the PICC to become clogged. A clot-dissolving medicine called tissue plasminogen activator (tPA) can be given through the PICC to help break up the clot.  Inflammation of the vein (phlebitis) in which the PICC is placed. Signs of inflammation may include redness, pain at the insertion site, red streaks, or being able to feel a "cord" in the vein where the PICC is located.  Infection in the PICC or at the insertion  site. Signs of infection may include fever, chills, redness, swelling, or pus drainage from the PICC insertion site.  PICC movement (malposition). The PICC tip may move from its original position due to excessive physical activity, forceful coughing, sneezing, or vomiting.  A break or cut in the PICC. It is important to not use scissors near the PICC.  Nerve or tendon irritation or injury during PICC insertion. WHAT SHOULD I KEEP IN MIND ABOUT ACTIVITIES WHEN I HAVE A PICC?  You may bend your arm and move it freely. If your PICC is near or at the bend of your elbow, avoid activity with repeated motion at the elbow.  Rest at home for the remainder of the day following PICC line insertion.  Avoid lifting heavy objects as instructed by your health care provider.  Avoid using a crutch with the arm on the same side as your PICC. You may need to use a walker. WHAT SHOULD I KNOW ABOUT MY PICC DRESSING?  Keep your PICC bandage (dressing) clean and dry to prevent infection.  Ask your health care provider when you may shower. Ask your health care provider to teach you how to wrap the PICC when you do take a shower.  Change the PICC dressing as instructed by your health care provider.  Change your PICC dressing if it becomes loose or wet. WHAT SHOULD I KNOW ABOUT PICC CARE?  Check the PICC insertion site   daily for leakage, redness, swelling, or pain.  Do not take a bath, swim, or use hot tubs when you have a PICC. Cover PICC line with clear plastic wrap and tape to keep it dry while showering.  Flush the PICC as directed by your health care provider. Let your health care provider know right away if the PICC is difficult to flush or does not flush. Do not use force to flush the PICC.  Do not use a syringe that is less than 10 mL to flush the PICC.  Never pull or tug on the PICC.  Avoid blood pressure checks on the arm with the PICC.  Keep your PICC identification card with you at all  times.  Do not take the PICC out yourself. Only a trained clinical professional should remove the PICC. SEEK IMMEDIATE MEDICAL CARE IF:  Your PICC is accidentally pulled all the way out. If this happens, cover the insertion site with a bandage or gauze dressing. Do not throw the PICC away. Your health care provider will need to inspect it.  Your PICC was tugged or pulled and has partially come out. Do not  push the PICC back in.  There is any type of drainage, redness, or swelling where the PICC enters the skin.  You cannot flush the PICC, it is difficult to flush, or the PICC leaks around the insertion site when it is flushed.  You hear a "flushing" sound when the PICC is flushed.  You have pain, discomfort, or numbness in your arm, shoulder, or jaw on the same side as the PICC.  You feel your heart "racing" or skipping beats.  You notice a hole or tear in the PICC.  You develop chills or a fever. MAKE SURE YOU:   Understand these instructions.  Will watch your condition.  Will get help right away if you are not doing well or get worse. Document Released: 05/07/2003 Document Revised: 03/17/2014 Document Reviewed: 07/08/2013 Exit[employer] Patient Information 2015 [employer]. This information is not intended to replace advice given to you by your health care provider. Make sure you discuss any questions you have with your health care provider.  

## 2014-12-12 ENCOUNTER — Telehealth: Payer: Self-pay | Admitting: Hematology and Oncology

## 2014-12-12 ENCOUNTER — Ambulatory Visit (HOSPITAL_BASED_OUTPATIENT_CLINIC_OR_DEPARTMENT_OTHER): Payer: Medicaid Other

## 2014-12-12 VITALS — BP 114/66 | HR 80 | Temp 98.0°F

## 2014-12-12 DIAGNOSIS — C8330 Diffuse large B-cell lymphoma, unspecified site: Secondary | ICD-10-CM

## 2014-12-12 DIAGNOSIS — Z452 Encounter for adjustment and management of vascular access device: Secondary | ICD-10-CM

## 2014-12-12 MED ORDER — SODIUM CHLORIDE 0.9 % IJ SOLN
10.0000 mL | INTRAMUSCULAR | Status: DC | PRN
  Administered 2014-12-12: 10 mL via INTRAVENOUS
  Filled 2014-12-12: qty 10

## 2014-12-12 MED ORDER — HEPARIN SOD (PORK) LOCK FLUSH 100 UNIT/ML IV SOLN
500.0000 [IU] | Freq: Once | INTRAVENOUS | Status: AC
  Administered 2014-12-12: 250 [IU] via INTRAVENOUS
  Filled 2014-12-12: qty 5

## 2014-12-12 NOTE — Patient Instructions (Signed)
PICC Home Guide A peripherally inserted central catheter (PICC) is a long, thin, flexible tube that is inserted into a vein in the upper arm. It is a form of intravenous (IV) access. It is considered to be a "central" line because the tip of the PICC ends in a large vein in your chest. This large vein is called the superior vena cava (SVC). The PICC tip ends in the SVC because there is a lot of blood flow in the SVC. This allows medicines and IV fluids to be quickly distributed throughout the body. The PICC is inserted using a sterile technique by a specially trained nurse or physician. After the PICC is inserted, a chest X-ray exam is done to be sure it is in the correct place.  A PICC may be placed for different reasons, such as:  To give medicines and liquid nutrition that can only be given through a central line. Examples are:  Certain antibiotic treatments.  Chemotherapy.  Total parenteral nutrition (TPN).  To take frequent blood samples.  To give IV fluids and blood products.  If there is difficulty placing a peripheral intravenous (PIV) catheter. If taken care of properly, a PICC can remain in place for several months. A PICC can also allow a person to go home from the hospital early. Medicine and PICC care can be managed at home by a family member or home health care team. WHAT PROBLEMS CAN HAPPEN WHEN I HAVE A PICC? Problems with a PICC can occasionally occur. These may include the following:  A blood clot (thrombus) forming in or at the tip of the PICC. This can cause the PICC to become clogged. A clot-dissolving medicine called tissue plasminogen activator (tPA) can be given through the PICC to help break up the clot.  Inflammation of the vein (phlebitis) in which the PICC is placed. Signs of inflammation may include redness, pain at the insertion site, red streaks, or being able to feel a "cord" in the vein where the PICC is located.  Infection in the PICC or at the insertion  site. Signs of infection may include fever, chills, redness, swelling, or pus drainage from the PICC insertion site.  PICC movement (malposition). The PICC tip may move from its original position due to excessive physical activity, forceful coughing, sneezing, or vomiting.  A break or cut in the PICC. It is important to not use scissors near the PICC.  Nerve or tendon irritation or injury during PICC insertion. WHAT SHOULD I KEEP IN MIND ABOUT ACTIVITIES WHEN I HAVE A PICC?  You may bend your arm and move it freely. If your PICC is near or at the bend of your elbow, avoid activity with repeated motion at the elbow.  Rest at home for the remainder of the day following PICC line insertion.  Avoid lifting heavy objects as instructed by your health care provider.  Avoid using a crutch with the arm on the same side as your PICC. You may need to use a walker. WHAT SHOULD I KNOW ABOUT MY PICC DRESSING?  Keep your PICC bandage (dressing) clean and dry to prevent infection.  Ask your health care provider when you may shower. Ask your health care provider to teach you how to wrap the PICC when you do take a shower.  Change the PICC dressing as instructed by your health care provider.  Change your PICC dressing if it becomes loose or wet. WHAT SHOULD I KNOW ABOUT PICC CARE?  Check the PICC insertion site   daily for leakage, redness, swelling, or pain.  Do not take a bath, swim, or use hot tubs when you have a PICC. Cover PICC line with clear plastic wrap and tape to keep it dry while showering.  Flush the PICC as directed by your health care provider. Let your health care provider know right away if the PICC is difficult to flush or does not flush. Do not use force to flush the PICC.  Do not use a syringe that is less than 10 mL to flush the PICC.  Never pull or tug on the PICC.  Avoid blood pressure checks on the arm with the PICC.  Keep your PICC identification card with you at all  times.  Do not take the PICC out yourself. Only a trained clinical professional should remove the PICC. SEEK IMMEDIATE MEDICAL CARE IF:  Your PICC is accidentally pulled all the way out. If this happens, cover the insertion site with a bandage or gauze dressing. Do not throw the PICC away. Your health care provider will need to inspect it.  Your PICC was tugged or pulled and has partially come out. Do not  push the PICC back in.  There is any type of drainage, redness, or swelling where the PICC enters the skin.  You cannot flush the PICC, it is difficult to flush, or the PICC leaks around the insertion site when it is flushed.  You hear a "flushing" sound when the PICC is flushed.  You have pain, discomfort, or numbness in your arm, shoulder, or jaw on the same side as the PICC.  You feel your heart "racing" or skipping beats.  You notice a hole or tear in the PICC.  You develop chills or a fever. MAKE SURE YOU:   Understand these instructions.  Will watch your condition.  Will get help right away if you are not doing well or get worse. Document Released: 05/07/2003 Document Revised: 03/17/2014 Document Reviewed: 07/08/2013 Exit[employer] Patient Information 2015 [employer]. This information is not intended to replace advice given to you by your health care provider. Make sure you discuss any questions you have with your health care provider.  

## 2014-12-12 NOTE — Telephone Encounter (Signed)
pt called to r/s appt...done....pt ok and aware of time

## 2014-12-15 ENCOUNTER — Ambulatory Visit: Payer: Medicaid Other

## 2014-12-15 ENCOUNTER — Ambulatory Visit (HOSPITAL_BASED_OUTPATIENT_CLINIC_OR_DEPARTMENT_OTHER): Payer: Medicaid Other

## 2014-12-15 DIAGNOSIS — Z789 Other specified health status: Secondary | ICD-10-CM

## 2014-12-15 DIAGNOSIS — IMO0001 Reserved for inherently not codable concepts without codable children: Secondary | ICD-10-CM

## 2014-12-15 DIAGNOSIS — C8330 Diffuse large B-cell lymphoma, unspecified site: Secondary | ICD-10-CM

## 2014-12-15 MED ORDER — EPOETIN ALFA 40000 UNIT/ML IJ SOLN
40000.0000 [IU] | INTRAMUSCULAR | Status: DC
  Administered 2014-12-15: 40000 [IU] via SUBCUTANEOUS
  Filled 2014-12-15: qty 1

## 2014-12-17 ENCOUNTER — Encounter (HOSPITAL_COMMUNITY): Payer: Self-pay

## 2014-12-17 ENCOUNTER — Telehealth: Payer: Self-pay | Admitting: Hematology and Oncology

## 2014-12-17 ENCOUNTER — Ambulatory Visit (HOSPITAL_COMMUNITY)
Admit: 2014-12-17 | Discharge: 2014-12-17 | Disposition: A | Discharge status: Home / Self Care | Payer: Medicaid Other | Source: Ambulatory Visit | Attending: Hematology and Oncology | Admitting: Hematology and Oncology

## 2014-12-17 DIAGNOSIS — C8330 Diffuse large B-cell lymphoma, unspecified site: Secondary | ICD-10-CM | POA: Diagnosis not present

## 2014-12-17 LAB — GLUCOSE, CAPILLARY: GLUCOSE-CAPILLARY: 94 mg/dL (ref 70–99)

## 2014-12-17 MED ORDER — FLUDEOXYGLUCOSE F - 18 (FDG) INJECTION
10.0000 | Freq: Once | INTRAVENOUS | Status: AC | PRN
  Administered 2014-12-17: 10 via INTRAVENOUS

## 2014-12-17 NOTE — Telephone Encounter (Signed)
pt called to cx todays appt...done....she did not want to r/s

## 2014-12-19 ENCOUNTER — Ambulatory Visit (HOSPITAL_BASED_OUTPATIENT_CLINIC_OR_DEPARTMENT_OTHER): Payer: Medicaid Other

## 2014-12-19 ENCOUNTER — Ambulatory Visit (HOSPITAL_BASED_OUTPATIENT_CLINIC_OR_DEPARTMENT_OTHER): Payer: Medicaid Other | Admitting: Hematology and Oncology

## 2014-12-19 ENCOUNTER — Encounter: Payer: Self-pay | Admitting: Hematology and Oncology

## 2014-12-19 ENCOUNTER — Telehealth: Payer: Self-pay | Admitting: Hematology and Oncology

## 2014-12-19 VITALS — BP 125/64 | HR 112 | Temp 98.7°F | Resp 18 | Ht 66.0 in | Wt 222.5 lb

## 2014-12-19 DIAGNOSIS — C8330 Diffuse large B-cell lymphoma, unspecified site: Secondary | ICD-10-CM

## 2014-12-19 DIAGNOSIS — Z789 Other specified health status: Secondary | ICD-10-CM

## 2014-12-19 DIAGNOSIS — IMO0001 Reserved for inherently not codable concepts without codable children: Secondary | ICD-10-CM

## 2014-12-19 DIAGNOSIS — D72829 Elevated white blood cell count, unspecified: Secondary | ICD-10-CM

## 2014-12-19 LAB — CBC WITH DIFFERENTIAL/PLATELET
BASO%: 0.2 % (ref 0.0–2.0)
Basophils Absolute: 0.1 10*3/uL (ref 0.0–0.1)
EOS ABS: 0.1 10*3/uL (ref 0.0–0.5)
EOS%: 0.3 % (ref 0.0–7.0)
HCT: 40.5 % (ref 34.8–46.6)
HGB: 13 g/dL (ref 11.6–15.9)
LYMPH%: 4.9 % — ABNORMAL LOW (ref 14.0–49.7)
MCH: 28.1 pg (ref 25.1–34.0)
MCHC: 32.1 g/dL (ref 31.5–36.0)
MCV: 87.7 fL (ref 79.5–101.0)
MONO#: 0.8 10*3/uL (ref 0.1–0.9)
MONO%: 2.7 % (ref 0.0–14.0)
NEUT#: 26.2 10*3/uL — ABNORMAL HIGH (ref 1.5–6.5)
NEUT%: 91.9 % — AB (ref 38.4–76.8)
Platelets: 271 10*3/uL (ref 145–400)
RBC: 4.62 10*6/uL (ref 3.70–5.45)
RDW: 18.2 % — ABNORMAL HIGH (ref 11.2–14.5)
WBC: 28.5 10*3/uL — AB (ref 3.9–10.3)
lymph#: 1.4 10*3/uL (ref 0.9–3.3)

## 2014-12-19 LAB — PREGNANCY, URINE: Preg Test, Ur: NEGATIVE

## 2014-12-19 LAB — COMPREHENSIVE METABOLIC PANEL (CC13)
ALK PHOS: 169 U/L — AB (ref 40–150)
ALT: 11 U/L (ref 0–55)
AST: 17 U/L (ref 5–34)
Albumin: 4 g/dL (ref 3.5–5.0)
Anion Gap: 11 mEq/L (ref 3–11)
BUN: 7.1 mg/dL (ref 7.0–26.0)
CO2: 23 meq/L (ref 22–29)
Calcium: 10 mg/dL (ref 8.4–10.4)
Chloride: 108 mEq/L (ref 98–109)
Creatinine: 0.8 mg/dL (ref 0.6–1.1)
Glucose: 83 mg/dl (ref 70–140)
Potassium: 3.9 mEq/L (ref 3.5–5.1)
Sodium: 143 mEq/L (ref 136–145)
TOTAL PROTEIN: 6.9 g/dL (ref 6.4–8.3)
Total Bilirubin: 0.27 mg/dL (ref 0.20–1.20)

## 2014-12-19 LAB — URIC ACID (CC13): Uric Acid, Serum: 9.4 mg/dl — ABNORMAL HIGH (ref 2.6–7.4)

## 2014-12-19 LAB — LACTATE DEHYDROGENASE (CC13): LDH: 485 U/L — AB (ref 125–245)

## 2014-12-19 MED ORDER — SODIUM CHLORIDE 0.9 % IJ SOLN
10.0000 mL | INTRAMUSCULAR | Status: DC | PRN
  Administered 2014-12-19: 10 mL via INTRAVENOUS
  Filled 2014-12-19: qty 10

## 2014-12-19 MED ORDER — HEPARIN SOD (PORK) LOCK FLUSH 100 UNIT/ML IV SOLN
500.0000 [IU] | Freq: Once | INTRAVENOUS | Status: AC
  Administered 2014-12-19: 500 [IU] via INTRAVENOUS
  Filled 2014-12-19: qty 5

## 2014-12-19 NOTE — Assessment & Plan Note (Signed)
This is related to recent Neulasta injection. She is not symptomatic with no signs of infection. I will observe only.

## 2014-12-19 NOTE — Telephone Encounter (Signed)
Patient will confirm appointments on MyChart.

## 2014-12-19 NOTE — Progress Notes (Signed)
[place] OFFICE PROGRESS NOTE  Patient Care Team: No Pcp Per Patient as PCP - General (General Practice)  SUMMARY OF ONCOLOGIC HISTORY: Oncology History   Diffuse large B cell lymphoma   Primary site: Lymphoid Neoplasms   Staging method: AJCC 6th Edition   Clinical: Stage IV signed by Heath Lark, MD on 09/03/2014  8:18 PM   Summary: Stage IV IPI of 3: high LDH, >2 extranodal disease and stage IV at presentation       Diffuse large B cell lymphoma   07/30/2014 Imaging She had a bilateral mammogram which came back abnormal. Ultrasound-guided biopsy was performed, suspicious for lymphoma   08/25/2014 Imaging PET/CT scan showed diffuse metastatic cancer involving bilateral breasts, bone, and large abdominal mass   08/28/2014 Surgery She had excisional surgery and removal of right breast mass   08/28/2014 Pathology Results Accession: [id number] right breast mass is consistent with diffuse large B-cell lymphoma with high proliferation index.   09/04/2014 - 09/11/2014 Hospital Admission she is admitted to the hospital for cycle 1 of [place]   09/04/2014 Imaging echocardiogram is normal.   09/04/2014 Procedure she has placement of PICC line.   10/14/2014 Imaging PET/CT scan showed near complete response to treatment.   12/17/2014 Imaging Repeat PET CT scan showed near complete response to treatment.    INTERVAL HISTORY: Please see below for problem oriented charting. She is seen prior to cycle 6 of treatment. She has no new symptoms. No new lymphadenopathy. She has not had any menstruation for several months. She has intermittent hot flashes.  REVIEW OF SYSTEMS:   Constitutional: Denies fevers, chills or abnormal weight loss Eyes: Denies blurriness of vision Ears, nose, mouth, throat, and face: Denies mucositis or sore throat Respiratory: Denies cough, dyspnea or wheezes Cardiovascular: Denies palpitation, chest discomfort or lower extremity  swelling Gastrointestinal:  Denies nausea, heartburn or change in bowel habits Skin: Denies abnormal skin rashes Lymphatics: Denies new lymphadenopathy or easy bruising Neurological:Denies numbness, tingling or new weaknesses Behavioral/Psych: Mood is stable, no new changes  All other systems were reviewed with the patient and are negative.  I have reviewed the past medical history, past surgical history, social history and family history with the patient and they are unchanged from previous note.  ALLERGIES:  is allergic to other and soap.  MEDICATIONS:  Current Outpatient Prescriptions  Medication Sig Dispense Refill  . HYDROcodone-acetaminophen (NORCO/VICODIN) 5-325 MG per tablet Take 1 tablet by mouth every 6 (six) hours as needed for moderate pain.    Marland Kitchen ondansetron (ZOFRAN) 8 MG tablet Take 1 tablet (8 mg total) by mouth every 8 (eight) hours as needed for nausea. (Patient not taking: Reported on 12/19/2014) 30 tablet 3  . prochlorperazine (COMPAZINE) 10 MG tablet Take 1 tablet (10 mg total) by mouth every 6 (six) hours as needed for nausea. (Patient not taking: Reported on 12/19/2014) 30 tablet 3   No current facility-administered medications for this visit.   Facility-Administered Medications Ordered in Other Visits  Medication Dose Route Frequency Provider Last Rate Last Dose  . epoetin alfa (EPOGEN,PROCRIT) injection 40,000 Units  40,000 Units Subcutaneous Weekly Heath Lark, MD   40,000 Units at 12/08/14 1645  . leuprolide (LUPRON) injection 7.5 mg  7.5 mg Intramuscular Q28 days Heath Lark, MD   7.5 mg at 12/08/14 1647  . sodium chloride 0.9 % injection 10 mL  10 mL Intravenous PRN Heath Lark, MD   10 mL at 12/19/14 1511    PHYSICAL EXAMINATION: ECOG PERFORMANCE  STATUS: 0 - Asymptomatic  Filed Vitals:   12/19/14 1425  BP: 125/64  Pulse: 112  Temp: 98.7 F (37.1 C)  Resp: 18   Filed Weights   12/19/14 1425  Weight: 222 lb 8 oz (100.925 kg)    GENERAL:alert, no distress  and comfortable SKIN: skin color, texture, turgor are normal, no rashes or significant lesions EYES: normal, Conjunctiva are pink and non-injected, sclera clear OROPHARYNX:no exudate, no erythema and lips, buccal mucosa, and tongue normal  NECK: supple, thyroid normal size, non-tender, without nodularity LYMPH:  no palpable lymphadenopathy in the cervical, axillary or inguinal LUNGS: clear to auscultation and percussion with normal breathing effort HEART: regular rate & rhythm and no murmurs and no lower extremity edema ABDOMEN:abdomen soft, non-tender and normal bowel sounds Musculoskeletal:no cyanosis of digits and no clubbing  NEURO: alert & oriented x 3 with fluent speech, no focal motor/sensory deficits  LABORATORY DATA:  I have reviewed the data as listed    Component Value Date/Time   NA 143 11/28/2014 1035   NA 141 10/01/2014 0545   K 4.0 11/28/2014 1035   K 3.8 10/01/2014 0545   CL 106 10/01/2014 0545   CO2 25 11/28/2014 1035   CO2 25 10/01/2014 0545   GLUCOSE 88 11/28/2014 1035   GLUCOSE 162* 10/01/2014 0545   BUN 9.8 11/28/2014 1035   BUN 10 10/01/2014 0545   CREATININE 0.8 11/28/2014 1035   CREATININE 0.62 11/11/2014 0545   CALCIUM 9.4 11/28/2014 1035   CALCIUM 9.2 10/01/2014 0545   PROT 6.7 11/28/2014 1035   PROT 6.4 10/01/2014 0545   ALBUMIN 3.8 11/28/2014 1035   ALBUMIN 3.2* 10/01/2014 0545   AST 13 11/28/2014 1035   AST 7 10/01/2014 0545   ALT 14 11/28/2014 1035   ALT 7 10/01/2014 0545   ALKPHOS 143 11/28/2014 1035   ALKPHOS 83 10/01/2014 0545   BILITOT 0.21 11/28/2014 1035   BILITOT <0.2* 10/01/2014 0545   GFRNONAA >90 11/11/2014 0545   GFRAA >90 11/11/2014 0545    No results found for: SPEP, UPEP  Lab Results  Component Value Date   WBC 28.5* 12/19/2014   NEUTROABS 26.2* 12/19/2014   HGB 13.0 12/19/2014   HCT 40.5 12/19/2014   MCV 87.7 12/19/2014   PLT 271 12/19/2014      Chemistry      Component Value Date/Time   NA 143 11/28/2014 1035    NA 141 10/01/2014 0545   K 4.0 11/28/2014 1035   K 3.8 10/01/2014 0545   CL 106 10/01/2014 0545   CO2 25 11/28/2014 1035   CO2 25 10/01/2014 0545   BUN 9.8 11/28/2014 1035   BUN 10 10/01/2014 0545   CREATININE 0.8 11/28/2014 1035   CREATININE 0.62 11/11/2014 0545      Component Value Date/Time   CALCIUM 9.4 11/28/2014 1035   CALCIUM 9.2 10/01/2014 0545   ALKPHOS 143 11/28/2014 1035   ALKPHOS 83 10/01/2014 0545   AST 13 11/28/2014 1035   AST 7 10/01/2014 0545   ALT 14 11/28/2014 1035   ALT 7 10/01/2014 0545   BILITOT 0.21 11/28/2014 1035   BILITOT <0.2* 10/01/2014 0545       RADIOGRAPHIC STUDIES: I reviewed the imaging study with the patient. I have personally reviewed the radiological images as listed and agreed with the findings in the report.  ASSESSMENT & PLAN:  Diffuse large B cell lymphoma She tolerated cycle 5 very well. We will proceed with cycle 6, conventional way without dose adjustment.  She has extensive stage IV disease with high-intermediate prognostic score (IPI=3).  She will proceed with treatment along without further doses of intrathecal methotrexate. She will need monthly Lupron injection to prevent menstruation and preserve fertility, next due 01/05/15. She will be admitted to the hospital on 12/22/2014.  Her PICC line will be discontinued and removed after her treatment is completed. After discharge, she will return to the [facility] on a weekly basis for supportive care. On 12/29/2014, she will receive Procrit injection along with Neulasta. I plan to see her on a monthly basis for supportive care visit and blood work monitoring.   Patient is Jehovah's Witness Due to her religious belief, she will not received blood products. She will receive Procrit injection weekly. So far, she has no signs of anemia.    Leukocytosis This is related to recent Neulasta injection. She is not symptomatic with no signs of infection. I will observe only.  The  patient is aware that I will be away next week. She will be admitted under Dr. Geralyn Flash care. I will see her back a month from now.  Orders Placed This Encounter  Procedures  . CBC with Differential/Platelet    Standing Status: Standing     Number of Occurrences: 6     Standing Expiration Date: 12/20/2015  . Comprehensive metabolic panel    Standing Status: Standing     Number of Occurrences: 6     Standing Expiration Date: 12/20/2015  . Lactate dehydrogenase    Standing Status: Standing     Number of Occurrences: 6     Standing Expiration Date: 12/20/2015  . Uric Acid    Standing Status: Standing     Number of Occurrences: 6     Standing Expiration Date: 12/20/2015  . Pregnancy, urine    Standing Status: Future     Number of Occurrences: 1     Standing Expiration Date: 12/19/2015   All questions were answered. The patient knows to call the clinic with any problems, questions or concerns. No barriers to learning was detected. I spent 40 minutes counseling the patient face to face. The total time spent in the appointment was 60 minutes and more than 50% was on counseling and review of test results     [facility] [place], MD 12/19/2014 3:44 PM

## 2014-12-19 NOTE — Assessment & Plan Note (Signed)
Due to her religious belief, she will not received blood products. She will receive Procrit injection weekly. So far, she has no signs of anemia.

## 2014-12-19 NOTE — Assessment & Plan Note (Signed)
She tolerated cycle 5 very well. We will proceed with cycle 6, conventional way without dose adjustment. She has extensive stage IV disease with high-intermediate prognostic score (IPI=3).  She will proceed with treatment along without further doses of intrathecal methotrexate. She will need monthly Lupron injection to prevent menstruation and preserve fertility, next due 01/05/15. She will be admitted to the hospital on 12/22/2014.  Her PICC line will be discontinued and removed after her treatment is completed. After discharge, she will return to the [facility] on a weekly basis for supportive care. On 12/29/2014, she will receive Procrit injection along with Neulasta. I plan to see her on a monthly basis for supportive care visit and blood work monitoring.

## 2014-12-22 ENCOUNTER — Encounter (HOSPITAL_COMMUNITY): Payer: Self-pay

## 2014-12-22 ENCOUNTER — Inpatient Hospital Stay (HOSPITAL_COMMUNITY)
Admission: AD | Admit: 2014-12-22 | Discharge: 2014-12-26 | DRG: 847 | Disposition: A | Discharge status: Home / Self Care | Payer: Medicaid Other | Source: Ambulatory Visit | Attending: Hematology and Oncology | Admitting: Hematology and Oncology

## 2014-12-22 DIAGNOSIS — C8330 Diffuse large B-cell lymphoma, unspecified site: Secondary | ICD-10-CM | POA: Diagnosis present

## 2014-12-22 DIAGNOSIS — Z79891 Long term (current) use of opiate analgesic: Secondary | ICD-10-CM

## 2014-12-22 DIAGNOSIS — Z79899 Other long term (current) drug therapy: Secondary | ICD-10-CM | POA: Diagnosis not present

## 2014-12-22 DIAGNOSIS — K5900 Constipation, unspecified: Secondary | ICD-10-CM | POA: Diagnosis present

## 2014-12-22 DIAGNOSIS — E790 Hyperuricemia without signs of inflammatory arthritis and tophaceous disease: Secondary | ICD-10-CM | POA: Diagnosis present

## 2014-12-22 DIAGNOSIS — Z8572 Personal history of non-Hodgkin lymphomas: Secondary | ICD-10-CM | POA: Diagnosis present

## 2014-12-22 DIAGNOSIS — D72829 Elevated white blood cell count, unspecified: Secondary | ICD-10-CM | POA: Diagnosis present

## 2014-12-22 DIAGNOSIS — Z5111 Encounter for antineoplastic chemotherapy: Secondary | ICD-10-CM | POA: Diagnosis present

## 2014-12-22 DIAGNOSIS — Z789 Other specified health status: Secondary | ICD-10-CM

## 2014-12-22 LAB — CBC WITH DIFFERENTIAL/PLATELET
BASOS PCT: 0 % (ref 0–1)
Basophils Absolute: 0 10*3/uL (ref 0.0–0.1)
Eosinophils Absolute: 0.1 10*3/uL (ref 0.0–0.7)
Eosinophils Relative: 0 % (ref 0–5)
HCT: 40.5 % (ref 36.0–46.0)
Hemoglobin: 12.6 g/dL (ref 12.0–15.0)
LYMPHS ABS: 1.3 10*3/uL (ref 0.7–4.0)
LYMPHS PCT: 11 % — AB (ref 12–46)
MCH: 27.9 pg (ref 26.0–34.0)
MCHC: 31.1 g/dL (ref 30.0–36.0)
MCV: 89.8 fL (ref 78.0–100.0)
MONO ABS: 0.6 10*3/uL (ref 0.1–1.0)
Monocytes Relative: 5 % (ref 3–12)
NEUTROS ABS: 9.3 10*3/uL — AB (ref 1.7–7.7)
Neutrophils Relative %: 84 % — ABNORMAL HIGH (ref 43–77)
Platelets: 306 10*3/uL (ref 150–400)
RBC: 4.51 MIL/uL (ref 3.87–5.11)
RDW: 18.4 % — ABNORMAL HIGH (ref 11.5–15.5)
WBC: 11.2 10*3/uL — ABNORMAL HIGH (ref 4.0–10.5)

## 2014-12-22 LAB — LACTATE DEHYDROGENASE: LDH: 271 U/L — AB (ref 94–250)

## 2014-12-22 LAB — COMPREHENSIVE METABOLIC PANEL
ALBUMIN: 3.9 g/dL (ref 3.5–5.2)
ALT: 16 U/L (ref 0–35)
ANION GAP: 7 (ref 5–15)
AST: 21 U/L (ref 0–37)
Alkaline Phosphatase: 107 U/L (ref 39–117)
BUN: 12 mg/dL (ref 6–23)
CALCIUM: 9.6 mg/dL (ref 8.4–10.5)
CHLORIDE: 107 mmol/L (ref 96–112)
CO2: 26 mmol/L (ref 19–32)
CREATININE: 0.69 mg/dL (ref 0.50–1.10)
GFR calc non Af Amer: >90 mL/min (ref >90)
Glucose, Bld: 100 mg/dL — ABNORMAL HIGH (ref 70–99)
Potassium: 4.3 mmol/L (ref 3.5–5.1)
Sodium: 140 mmol/L (ref 135–145)
Total Bilirubin: 0.4 mg/dL (ref 0.3–1.2)
Total Protein: 6.9 g/dL (ref 6.0–8.3)

## 2014-12-22 MED ORDER — ACETAMINOPHEN 325 MG PO TABS: 650.0000 mg | ORAL_TABLET | ORAL | Status: DC | PRN

## 2014-12-22 MED ORDER — HYDROCODONE-ACETAMINOPHEN 5-325 MG PO TABS: 1.0000 | ORAL_TABLET | Freq: Four times a day (QID) | ORAL | Status: DC | PRN

## 2014-12-22 MED ORDER — SODIUM CHLORIDE 0.9 % IV SOLN
375.0000 mg/m2 | Freq: Once | INTRAVENOUS | Status: AC
  Administered 2014-12-22: 800 mg via INTRAVENOUS
  Filled 2014-12-22: qty 80

## 2014-12-22 MED ORDER — FAMOTIDINE IN NACL 20-0.9 MG/50ML-% IV SOLN
20.0000 mg | Freq: Once | INTRAVENOUS | Status: AC | PRN
  Filled 2014-12-22: qty 50

## 2014-12-22 MED ORDER — ALLOPURINOL 100 MG PO TABS
100.0000 mg | ORAL_TABLET | Freq: Every day | ORAL | Status: AC
  Administered 2014-12-22 – 2014-12-26 (×5): 100 mg via ORAL
  Filled 2014-12-22 (×5): qty 1

## 2014-12-22 MED ORDER — DIPHENHYDRAMINE HCL 50 MG PO CAPS
50.0000 mg | ORAL_CAPSULE | Freq: Once | ORAL | Status: AC
  Administered 2014-12-22: 50 mg via ORAL
  Filled 2014-12-22: qty 1

## 2014-12-22 MED ORDER — ALTEPLASE 2 MG IJ SOLR
2.0000 mg | Freq: Once | INTRAMUSCULAR | Status: AC | PRN
  Filled 2014-12-22: qty 2

## 2014-12-22 MED ORDER — SODIUM CHLORIDE 0.9 % IV SOLN
INTRAVENOUS | Status: DC
  Administered 2014-12-22 – 2014-12-25 (×2): via INTRAVENOUS

## 2014-12-22 MED ORDER — HEPARIN SOD (PORK) LOCK FLUSH 100 UNIT/ML IV SOLN: 250.0000 [IU] | Freq: Once | INTRAVENOUS | Status: DC | PRN

## 2014-12-22 MED ORDER — ONDANSETRON 8 MG/NS 50 ML IVPB
8.0000 mg | Freq: Three times a day (TID) | INTRAVENOUS | Status: DC | PRN
  Filled 2014-12-22: qty 8

## 2014-12-22 MED ORDER — DIPHENHYDRAMINE HCL 50 MG/ML IJ SOLN: 50.0000 mg | Freq: Once | INTRAMUSCULAR | Status: AC | PRN

## 2014-12-22 MED ORDER — ENOXAPARIN SODIUM 40 MG/0.4ML ~~LOC~~ SOLN
40.0000 mg | Freq: Every day | SUBCUTANEOUS | Status: DC
  Filled 2014-12-22 (×5): qty 0.4

## 2014-12-22 MED ORDER — SODIUM CHLORIDE 0.9 % IV SOLN: Freq: Once | INTRAVENOUS | Status: AC | PRN

## 2014-12-22 MED ORDER — ONDANSETRON 4 MG PO TBDP: 4.0000 mg | ORAL_TABLET | Freq: Three times a day (TID) | ORAL | Status: DC | PRN

## 2014-12-22 MED ORDER — HOT PACK MISC ONCOLOGY
1.0000 | Freq: Once | Status: AC | PRN
  Filled 2014-12-22: qty 1

## 2014-12-22 MED ORDER — SODIUM CHLORIDE 0.9 % IV SOLN
6.0000 mg | Freq: Once | INTRAVENOUS | Status: AC
  Administered 2014-12-22: 6 mg via INTRAVENOUS
  Filled 2014-12-22: qty 4

## 2014-12-22 MED ORDER — VINCRISTINE SULFATE CHEMO INJECTION 1 MG/ML
INTRAVENOUS | Status: AC
  Administered 2014-12-22 – 2014-12-25 (×4): via INTRAVENOUS
  Filled 2014-12-22 (×4): qty 10

## 2014-12-22 MED ORDER — HEPARIN SOD (PORK) LOCK FLUSH 100 UNIT/ML IV SOLN: 500.0000 [IU] | Freq: Once | INTRAVENOUS | Status: AC | PRN

## 2014-12-22 MED ORDER — SODIUM CHLORIDE 0.9 % IJ SOLN: 10.0000 mL | INTRAMUSCULAR | Status: DC | PRN

## 2014-12-22 MED ORDER — ACETAMINOPHEN 325 MG PO TABS
650.0000 mg | ORAL_TABLET | Freq: Once | ORAL | Status: AC
  Administered 2014-12-22: 650 mg via ORAL
  Filled 2014-12-22: qty 2

## 2014-12-22 MED ORDER — ALTEPLASE 2 MG IJ SOLR
2.0000 mg | Freq: Once | INTRAMUSCULAR | Status: DC | PRN
Start: 2014-12-22 — End: 2014-12-22
  Filled 2014-12-22: qty 2

## 2014-12-22 MED ORDER — ONDANSETRON HCL 4 MG/2ML IJ SOLN: 4.0000 mg | Freq: Three times a day (TID) | INTRAMUSCULAR | Status: DC | PRN

## 2014-12-22 MED ORDER — PROCHLORPERAZINE MALEATE 10 MG PO TABS: 10.0000 mg | ORAL_TABLET | Freq: Four times a day (QID) | ORAL | Status: DC | PRN

## 2014-12-22 MED ORDER — EPINEPHRINE HCL 0.1 MG/ML IJ SOSY
0.2500 mg | PREFILLED_SYRINGE | Freq: Once | INTRAMUSCULAR | Status: AC | PRN
  Filled 2014-12-22: qty 10

## 2014-12-22 MED ORDER — COLD PACK MISC ONCOLOGY
1.0000 | Freq: Once | Status: AC | PRN
  Filled 2014-12-22: qty 1

## 2014-12-22 MED ORDER — HEPARIN SOD (PORK) LOCK FLUSH 100 UNIT/ML IV SOLN
500.0000 [IU] | Freq: Once | INTRAVENOUS | Status: DC | PRN
Start: 2014-12-22 — End: 2014-12-22

## 2014-12-22 MED ORDER — ALBUTEROL SULFATE (2.5 MG/3ML) 0.083% IN NEBU: 2.5000 mg | INHALATION_SOLUTION | Freq: Once | RESPIRATORY_TRACT | Status: AC | PRN

## 2014-12-22 MED ORDER — ONDANSETRON HCL 4 MG PO TABS: 4.0000 mg | ORAL_TABLET | Freq: Three times a day (TID) | ORAL | Status: DC | PRN

## 2014-12-22 MED ORDER — HEPARIN SOD (PORK) LOCK FLUSH 100 UNIT/ML IV SOLN: 250.0000 [IU] | Freq: Once | INTRAVENOUS | Status: AC | PRN

## 2014-12-22 MED ORDER — SODIUM CHLORIDE 0.9 % IV SOLN
Freq: Once | INTRAVENOUS | Status: AC
  Administered 2014-12-22: 10:00:00 via INTRAVENOUS

## 2014-12-22 MED ORDER — SODIUM CHLORIDE 0.9 % IV SOLN: INTRAVENOUS | Status: DC

## 2014-12-22 MED ORDER — SODIUM CHLORIDE 0.9 % IV SOLN
INTRAVENOUS | Status: AC
  Administered 2014-12-22 – 2014-12-25 (×4): 8 mg via INTRAVENOUS
  Filled 2014-12-22 (×4): qty 4

## 2014-12-22 MED ORDER — ONDANSETRON 8 MG/NS 50 ML IVPB
8.0000 mg | Freq: Three times a day (TID) | INTRAVENOUS | Status: DC
  Administered 2014-12-22 – 2014-12-26 (×8): 8 mg via INTRAVENOUS
  Filled 2014-12-22 (×13): qty 8

## 2014-12-22 MED ORDER — SODIUM CHLORIDE 0.9 % IJ SOLN: 3.0000 mL | INTRAMUSCULAR | Status: DC | PRN

## 2014-12-22 MED ORDER — DIPHENHYDRAMINE HCL 50 MG/ML IJ SOLN: 25.0000 mg | Freq: Once | INTRAMUSCULAR | Status: AC | PRN

## 2014-12-22 MED ORDER — EPINEPHRINE HCL 1 MG/ML IJ SOLN
0.5000 mg | Freq: Once | INTRAMUSCULAR | Status: AC | PRN
  Filled 2014-12-22: qty 1

## 2014-12-22 MED ORDER — METHYLPREDNISOLONE SODIUM SUCC 125 MG IJ SOLR
125.0000 mg | Freq: Once | INTRAMUSCULAR | Status: AC | PRN
  Filled 2014-12-22: qty 2

## 2014-12-22 NOTE — Progress Notes (Cosign Needed)
Chemotherapy dosage calculations and dilutions verified with second RN, Drue Dun. Zandra Abts [facility]  12/22/2014  1:23 PM

## 2014-12-22 NOTE — Plan of Care (Signed)
Problem: Phase I Progression Outcomes Goal: IV site with good blood return Outcome: Completed/Met Date Met:  12/22/14 Free flowing blood return noted from both lumens of PICC line. Goal: Hydration per MD order Outcome: Completed/Met Date Met:  12/22/14 Normal saline at 50 cc/hr.  Goal: Pain controlled with appropriate interventions Outcome: Completed/Met Date Met:  12/22/14 Patient denies pain at this time. Goal: OOB as tolerated unless otherwise ordered Outcome: Completed/Met Date Met:  12/22/14 Patient ambulating in room and hall per self. Goal: Initial discharge plan identified Outcome: Completed/Met Date Met:  12/22/14 Patient will be discharged after completion of chemotherapy regimen.

## 2014-12-22 NOTE — Progress Notes (Signed)
BSA and Rituxan dose calculated and confirmed by myself and Reyne Dumas, RN.

## 2014-12-22 NOTE — H&P (Signed)
[place]  Telephone:([phone]    ADMISSION NOTE Attending MD:  Heath Lark, MD  Reason for Admission: Cycle 6 of Chemotherapy with R-EPOCH for the treatment of Diffuse, large B Cell Lymphoma  HPI: Lindsey Irwin is an 19 y.o. female with a history of Diffuse large B Cell Lymphoma, admitted to the hospital for the initiation of Cycle 6 of Chemotherapy with R-EPOCH. She feels well.  Afebrile. She denies chills or night sweats. Denies shortness of breath or chest pain. Denies nausea, vomiting, diarrhea or mucositis. Appetite is normal. Denies constipation, last bowel movement today. Denies dysuria or hematuria. Denies any headaches, vision changes or dizziness. Denies any rashes. No bleeding issues. Ambulating frequently without difficulty. No confusion reported.   Oncology History   Diffuse large B cell lymphoma  Primary site: Lymphoid Neoplasms  Staging method: AJCC 6th Edition  Clinical: Stage IV signed by Heath Lark, MD on 09/03/2014 8:18 PM  Summary: Stage IV IPI of 3: high LDH, >2 extranodal disease and stage IV at presentation       Diffuse large B cell lymphoma   07/30/2014 Imaging She had a bilateral mammogram which came back abnormal. Ultrasound-guided biopsy was performed, suspicious for lymphoma   08/25/2014 Imaging PET/CT scan showed diffuse metastatic cancer involving bilateral breasts, bone, and large abdominal mass   08/28/2014 Surgery She had excisional surgery and removal of right breast mass   08/28/2014 Pathology Results Accession: [id number] right breast mass is consistent with diffuse large B-cell lymphoma with high proliferation index.   09/04/2014 - 09/11/2014 Hospital Admission she is admitted to the hospital for cycle 1 of [place]   09/04/2014 Imaging echocardiogram is normal.   09/04/2014 Procedure she has placement of PICC line.   09/29/14 Hospital admission The patient was admitted to the hospital  for cycle 2 of [place]   10/14/2014 Imaging PET-CT scan showed near-complete response to treatment   10/20/2014 Hospital admission The patient was admitted to the hospital for cycle 3 of  [place]   11/10/2014 Hospital admission The patient was admitted to the hospital for cycle 4 of  [place]   12/01/14 Hospital Admission The patient was admitted to the hospital for cycle 5 of  [place]   12/17/14 Imaging Repeat PET CT scan showed near complete response to treatment         PMH:  Past Medical History  Diagnosis Date  . Breast mass 08/2014  . Sore throat 08/25/2014  . Cough 08/2014  . Nasal congestion 08/2014  . Diffuse large B cell lymphoma 09/02/2014  . Insomnia 09/24/2014    Surgeries:  Past Surgical History  Procedure Laterality Date  . No past surgeries    . Breast biopsy Right 08/28/2014    Procedure: RIGHT EXCISIONAL BREAST BIOPSY;  Surgeon: Donnie Mesa, MD;  Location: [facility];  Service: General;  Laterality: Right;    Allergies:  Allergies  Allergen Reactions  . Other     NO BLOOD PRODUCTS. PT IS A JEHOVAH WITNESS.  Marland Kitchen Soap Itching    GAIN LAUNDRY DETERGENT Allergic to tide and surf also    Medications:   Prior to Admission:  Prescriptions prior to admission  Medication Sig Dispense Refill Last Dose  . HYDROcodone-acetaminophen (NORCO/VICODIN) 5-325 MG per tablet Take 1 tablet by mouth every 4 (four) hours as needed. (Patient not taking: Reported on 11/03/2014) 40 tablet 0 Not Taking  . ondansetron (ZOFRAN) 8 MG tablet Take 1 tablet (8 mg total) by mouth every 8 (  eight) hours as needed for nausea. (Patient not taking: Reported on 11/03/2014) 30 tablet 3 Not Taking  . prochlorperazine (COMPAZINE) 10 MG tablet Take 1 tablet (10 mg total) by mouth every 6 (six) hours as needed for nausea. (Patient not taking: Reported on 11/03/2014) 30 tablet 3 Not Taking  . zolpidem (AMBIEN) 10 MG tablet Take 1 tablet (10 mg total) by  mouth at bedtime as needed for sleep. (Patient not taking: Reported on 11/03/2014) 30 tablet 0 Not Taking   Scheduled Meds: . enoxaparin (LOVENOX) injection  40 mg Subcutaneous Q24H  . ondansetron (ZOFRAN) IV  8 mg Intravenous 3 times per day  . rasburicase (ELITEK) IV infusion  6 mg Intravenous Once   Continuous Infusions: . sodium chloride     PRN Meds:.acetaminophen, HYDROcodone-acetaminophen, ondansetron **OR** ondansetron **OR** ondansetron (ZOFRAN) IV **OR** ondansetron (ZOFRAN) IV, prochlorperazine  Review of Systems  Constitutional: Denies fevers, chills or abnormal weight loss Eyes: Denies blurriness of vision Ears nose,mouth, throat, and face: Denies mucositis or sore throat Respiratory: Denies cough, dyspnea or wheezes Cardiovascular: Denies palpitation, chest discomfort or lower extremity swelling Gastrointestinal:Denies nausea, heartburn or change in bowel habits Skin: Denies abnormal skin rashes Lymphatics: Denies new lymphadenopathy or easy bruising Neurological: Denies numbness, tingling or new weaknesses Behavioral/Psych: Mood is stable, no new changes  All other systems were reviewed with the patient and are negative  Family History: No family history on file.  Social History:  reports that she has never smoked. She has never used smokeless tobacco. She reports that she does not drink alcohol or use illicit drugs.  Physical Exam:  19 y.o. female  in no acute distress, alert, conversant.  GENERAL:alert, no distress and comfortable SKIN: skin color, texture, turgor are normal, no rashes or significant lesions. Alopecia EYES: normal, Conjunctiva are pink and non-injected, sclera clear OROPHARYNX:no exudate, no erythema and lips, buccal mucosa, and tongue normal  NECK: supple, thyroid normal size, non-tender, without nodularity LYMPH: no palpable lymphadenopathy in the cervical, axillary or inguinal LUNGS: clear to auscultation and percussion with normal  breathing effort. HEART: regular rate & rhythm and no murmurs and no lower extremity edema ABDOMEN:abdomen soft, non-tender and normal bowel sounds Musculoskeletal:no cyanosis of digits and no clubbing  NEURO: alert & oriented x 3 with fluent speech, no focal motor/sensory deficits  PICC line site looks normal  LABS: CBC Latest Ref Rng 12/19/2014 11/28/2014 11/11/2014  WBC 3.9 - 10.3 10e3/uL 28.5(H) 21.8(H) 23.4(H)  Hemoglobin 11.6 - 15.9 g/dL 13.0 12.1 12.7  Hematocrit 34.8 - 46.6 % 40.5 38.3 40.2  Platelets 145 - 400 10e3/uL 271 244 336    Lab Results  Component Value Date   CREATININE 0.8 12/19/2014    A/P: 19 y.o. female   Diffuse large B cell lymphoma She tolerated cycle 5 very well.  We will proceed with cycle 6 of infusional R-EPOCH, from day 1-5 and every 21 days conventional way without dose adjustment. She has extensive stage IV disease with high-intermediate prognostic score (IPI=3).  She will proceed with treatment along without further doses of intrathecal methotrexate.  She will need monthly Lupron injection to prevent menstruation and preserve fertility, next due 01/05/15. Her PICC line will be discontinued and removed after her treatment is completed. After discharge, she will return to the [facility] on a weekly basis for supportive care. On 12/29/2014, she will receive Procrit injection along with Neulasta.  Jehovah's Witness Due to her religious belief, she will not received blood products.  She will receive  Procrit injection weekly. Her last dose was on 12/15/14 So far, she has no signs of anemia.  Leukocytosis This is related to recent Neulasta injection on 12/08/14. She is not symptomatic with no signs of infection. Will observe only.  Hyperuricemia Cause is unknown. Will give her 1 dose of rasburicase along with IV fluids (last dose given on 12/01/14)  DVT prophylaxis Lovenox initiated  Code status Full code  Rondel Jumbo,  PA-C 12/22/2014  Addendum Attending note: History and physical  I personally saw the patient, reviewed the chart and examined the patient. The plan of care was discussed with the patient . I agree with the assessment and plan as documented above.   Summary of history:Lindsey Irwin is a 19 year old with diffuse large was lymphoma involving her breasts who was admitted today for cycle 6 of chemotherapy with R-EPOCH. She had previously tolerated the chemotherapy fairly well without any nausea vomiting issues. Dr. Alvy Bimler has already arranged and took care of the chemotherapy orders and she reports no new problems or concerns today.  Examination: I reviewed the physical examination documented above and I agree with the findings.  Assessment and plan: 1. Diffuse large B cell lymphoma cycle 6 of chemotherapy with R-EPOCH.  2. We will monitor closely for chemotherapy toxicities 3. Hyperuricemia: Received rasburicase today. I will start her on allopurinol once daily for the next 5 days 4. Monitor daily CBC CMP as well as uric acid levels

## 2014-12-23 LAB — COMPREHENSIVE METABOLIC PANEL
ALK PHOS: 102 U/L (ref 39–117)
ALT: 17 U/L (ref 0–35)
AST: 17 U/L (ref 0–37)
Albumin: 3.8 g/dL (ref 3.5–5.2)
Anion gap: 7 (ref 5–15)
BILIRUBIN TOTAL: 0.5 mg/dL (ref 0.3–1.2)
BUN: 11 mg/dL (ref 6–23)
CO2: 26 mmol/L (ref 19–32)
Calcium: 9.8 mg/dL (ref 8.4–10.5)
Chloride: 108 mmol/L (ref 96–112)
Creatinine, Ser: 0.6 mg/dL (ref 0.50–1.10)
GFR calc Af Amer: >90 mL/min (ref >90)
GFR calc non Af Amer: >90 mL/min (ref >90)
GLUCOSE: 119 mg/dL — AB (ref 70–99)
Potassium: 4.3 mmol/L (ref 3.5–5.1)
SODIUM: 141 mmol/L (ref 135–145)
Total Protein: 6.7 g/dL (ref 6.0–8.3)

## 2014-12-23 LAB — CBC WITH DIFFERENTIAL/PLATELET
BASOS ABS: 0 10*3/uL (ref 0.0–0.1)
BASOS PCT: 0 % (ref 0–1)
Eosinophils Absolute: 0 10*3/uL (ref 0.0–0.7)
Eosinophils Relative: 0 % (ref 0–5)
HCT: 40.8 % (ref 36.0–46.0)
Hemoglobin: 12.7 g/dL (ref 12.0–15.0)
Lymphocytes Relative: 2 % — ABNORMAL LOW (ref 12–46)
Lymphs Abs: 0.5 10*3/uL — ABNORMAL LOW (ref 0.7–4.0)
MCH: 27.7 pg (ref 26.0–34.0)
MCHC: 31.1 g/dL (ref 30.0–36.0)
MCV: 88.9 fL (ref 78.0–100.0)
MONO ABS: 0.1 10*3/uL (ref 0.1–1.0)
Monocytes Relative: 1 % — ABNORMAL LOW (ref 3–12)
NEUTROS ABS: 19 10*3/uL — AB (ref 1.7–7.7)
NEUTROS PCT: 97 % — AB (ref 43–77)
PLATELETS: 314 10*3/uL (ref 150–400)
RBC: 4.59 MIL/uL (ref 3.87–5.11)
RDW: 18 % — AB (ref 11.5–15.5)
WBC: 19.6 10*3/uL — ABNORMAL HIGH (ref 4.0–10.5)

## 2014-12-23 LAB — URIC ACID

## 2014-12-23 NOTE — Progress Notes (Signed)
Chemo dosage and calculation check manually with Aldean Baker RN

## 2014-12-23 NOTE — Progress Notes (Signed)
Lindsey Irwin   DOB:June 13, 1996   CN#:[id number]   OQH#:[id number]  Patient Care Team: No Pcp Per Patient as PCP - General (General Practice)    Subjective: Patient seen and examined. Tolerated C6 Day 1 of Chemo well.  Afebrile. Denies fever, chills or night sweats. Denies shortness of breath or chest pain. Denies nausea, vomiting, diarrhea or mucositis. Appetite is normal. Last bowel movement on 2/8. Denies dysuria. Denies any headaches, vision changes or dizziness. Denies any rashes. No bleeding issues. She has been ambulating. No confusion reported.  Scheduled Meds: . allopurinol  100 mg Oral Daily  . DOXOrubicin/vinCRIStine/etoposide CHEMO IV infusion for Inpatient CI   Intravenous Q24H  . enoxaparin (LOVENOX) injection  40 mg Subcutaneous Daily  . ondansetron (ZOFRAN) with dexamethasone (DECADRON) IV   Intravenous Q24H  . ondansetron (ZOFRAN) IV  8 mg Intravenous 3 times per day   Continuous Infusions: . sodium chloride 50 mL/hr at 12/22/14 0943  . sodium chloride     PRN Meds:.acetaminophen, HYDROcodone-acetaminophen, ondansetron **OR** ondansetron **OR** ondansetron (ZOFRAN) IV **OR** ondansetron (ZOFRAN) IV, prochlorperazine, sodium chloride, sodium chloride   Objective:  Filed Vitals:   12/23/14 0413  BP: 104/64  Pulse: 70  Temp: 97.7 F (36.5 C)  Resp: 18      Intake/Output Summary (Last 24 hours) at 12/23/14 5465 Last data filed at 12/23/14 0600  Gross per 24 hour  Intake 2069.47 ml  Output      0 ml  Net 2069.47 ml    ECOG PERFORMANCE STATUS:0  GENERAL:alert, no distress and comfortable SKIN: skin color, texture, turgor are normal, no rashes or significant lesions. Alopecia noted EYES: normal, conjunctiva are pink and non-injected, sclera clear OROPHARYNX:no exudate, no erythema and lips, buccal mucosa, and tongue normal  NECK: supple, thyroid normal size, non-tender, without nodularity LYMPH:  no palpable lymphadenopathy in the cervical, axillary or  inguinal LUNGS:clear to auscultation without rhonchi or rales, no wheezing. Normal breathing effort HEART: regular rate & rhythm and no murmurs and no lower extremity edema. Right PICC line normal ABDOMEN: soft, non-tender and normal bowel sounds Musculoskeletal:no cyanosis of digits and no clubbing  PSYCH: alert & oriented x 3 with fluent speech NEURO: no focal motor deficits.    CBG (last 3)  No results for input(s): GLUCAP in the last 72 hours.   Labs:   Recent Labs Lab 12/19/14 1503 12/22/14 0940 12/23/14 0428  WBC 28.5* 11.2* 19.6*  HGB 13.0 12.6 12.7  HCT 40.5 40.5 40.8  PLT 271 306 314  MCV 87.7 89.8 88.9  MCH 28.1 27.9 27.7  MCHC 32.1 31.1 31.1  RDW 18.2* 18.4* 18.0*  LYMPHSABS 1.4 1.3 0.5*  MONOABS 0.8 0.6 0.1  EOSABS 0.1 0.1 0.0  BASOSABS 0.1 0.0 0.0     Chemistries:    Recent Labs Lab 12/19/14 1503 12/22/14 0940 12/23/14 0428  NA 143 140 141  K 3.9 4.3 4.3  CL  --  107 108  CO2 23 26 26   GLUCOSE 83 100* 119*  BUN 7.1 12 11   CREATININE 0.8 0.69 0.60  CALCIUM 10.0 9.6 9.8  AST 17 21 17   ALT 11 16 17   ALKPHOS 169* 107 102  BILITOT 0.27 0.4 0.5    GFR Estimated Creatinine Clearance: 134.8 mL/min (by C-G formula based on Cr of 0.6).  Liver Function Tests:  Recent Labs Lab 12/19/14 1503 12/22/14 0940 12/23/14 0428  AST 17 21 17   ALT 11 16 17   ALKPHOS 169* 107 102  BILITOT 0.27 0.4 0.5  PROT 6.9 6.9 6.7  ALBUMIN 4.0 3.9 3.8   Urinalysis    Component Value Date/Time   COLORURINE YELLOW 12/01/2014 0940   APPEARANCEUR CLOUDY* 12/01/2014 0940   LABSPEC 1.021 12/01/2014 0940   PHURINE 5.5 12/01/2014 0940   GLUCOSEU NEGATIVE 12/01/2014 0940   HGBUR NEGATIVE 12/01/2014 0940   BILIRUBINUR NEGATIVE 12/01/2014 0940   KETONESUR NEGATIVE 12/01/2014 0940   PROTEINUR NEGATIVE 12/01/2014 0940   UROBILINOGEN 0.2 12/01/2014 0940   NITRITE NEGATIVE 12/01/2014 0940   LEUKOCYTESUR NEGATIVE 12/01/2014 0940     Imaging Studies:  No results  found.  Assessment/Plan: 19 y.o.  Diffuse large B cell lymphoma She was admitted for cycle 6 of infusional R-EPOCH, from day 1-5 and every 21 days conventional way without dose adjustment. She will proceed with treatment along without further doses of intrathecal methotrexate.  She will need monthly Lupron injection to prevent menstruation and preserve fertility, next due 01/05/15. Her PICC line will be discontinued and removed after her treatment is completed. After discharge, she will return to the [facility] on a weekly basis for supportive care. On 12/29/2014, she will receive Procrit injection along with Neulasta.  [employer] Due to her religious beliefs, she will not receive blood products No signs of anemia noticed at this time She will receive Procrit injection weekly as preventive strategy against the need for transfusion, last dose on 12/15/14  Leukocytosis This is related to recent Neulasta injection on 12/08/14. No signs of infection noted. Will observe only.  Hyperuricemia Cause is unknown. She received 1 dose of rasburicase along with IV fluids on 12/22/14 She also received allopurinol scheduled to be given daily for 5 days To date, no signs of tumor lysis. Her LDH as of 2/8 is 271 and Uric Acid is <0.5  DVT prophylaxis She was started on Lovenox, and this can be discontinued if her platelet count drops to less than 50,000  Full Code  Shepherd Finnan E, PA-C 12/23/2014  7:12 AM

## 2014-12-23 NOTE — Progress Notes (Signed)
Lindsey Irwin   DOB:February 09, 1996   PH#:[id number]   IAX#:[id number]  Subjective:  Feeling well. No complaints. She is tolerating chemotherapy well.  . sodium chloride 50 mL/hr at 12/22/14 0943  . sodium chloride     . allopurinol  100 mg Oral Daily  . DOXOrubicin/vinCRIStine/etoposide CHEMO IV infusion for Inpatient CI   Intravenous Q24H  . enoxaparin (LOVENOX) injection  40 mg Subcutaneous Daily  . ondansetron (ZOFRAN) with dexamethasone (DECADRON) IV   Intravenous Q24H  . ondansetron (ZOFRAN) IV  8 mg Intravenous 3 times per day  acetaminophen, HYDROcodone-acetaminophen, ondansetron **OR** ondansetron **OR** ondansetron (ZOFRAN) IV **OR** ondansetron (ZOFRAN) IV, prochlorperazine, sodium chloride, sodium chloride Objective:  Filed Vitals:   12/23/14 1325  BP: 111/51  Pulse: 72  Temp: 98.4 F (36.9 C)  Resp: 16    Body mass index is 35.53 kg/(m^2).  Intake/Output Summary (Last 24 hours) at 12/23/14 1722 Last data filed at 12/23/14 1230  Gross per 24 hour  Intake 1830.3 ml  Output      0 ml  Net 1830.3 ml     Sclerae unicteric  Lungs clear -- no rales or rhonchi  Heart regular rate and rhythm  Abdomen benign  MSK no focal spinal tenderness, no peripheral edema  Neuro nonfocal  Extremities: no edema. Moves all extremities  CBG (last 3)  No results for input(s): GLUCAP in the last 72 hours.   Labs:  Lab Results  Component Value Date   WBC 19.6* 12/23/2014   HGB 12.7 12/23/2014   HCT 40.8 12/23/2014   MCV 88.9 12/23/2014   PLT 314 12/23/2014   NEUTROABS 19.0* 07/86/7544   Basic Metabolic Panel:  Recent Labs Lab 12/19/14 1503 12/22/14 0940 12/23/14 0428  NA 143 140 141  K 3.9 4.3 4.3  CL  --  107 108  CO2 23 26 26   GLUCOSE 83 100* 119*  BUN 7.1 12 11   CREATININE 0.8 0.69 0.60  CALCIUM 10.0 9.6 9.8   GFR Estimated Creatinine Clearance: 134.8 mL/min (by C-G formula based on Cr of 0.6). Liver Function Tests:  Recent Labs Lab 12/19/14 1503 12/22/14 0940  12/23/14 0428  AST 17 21 17   ALT 11 16 17   ALKPHOS 169* 107 102  BILITOT 0.27 0.4 0.5  PROT 6.9 6.9 6.7  ALBUMIN 4.0 3.9 3.8    CBC:  Recent Labs Lab 12/19/14 1503 12/22/14 0940 12/23/14 0428  WBC 28.5* 11.2* 19.6*  NEUTROABS 26.2* 9.3* 19.0*  HGB 13.0 12.6 12.7  HCT 40.5 40.5 40.8  MCV 87.7 89.8 88.9  PLT 271 306 314   CBG:  Recent Labs Lab 12/17/14 1058  GLUCAP 94    Reviewed vital signs and available labs.    Assessment: 19 y.o. woman with past medical history of diffuse large B-cell lymphoma, stage IV on chemotherapy, was admitted for chemotherapy and management of tumor lysis syndrome. She is tolerating chemotherapy.  Plan: 1. Diffuse large B-cell lymphoma: On chemotherapy with R-EPOCH. Doing cycle 6 and this is day #2 of her treatment cycle. Tolerating chemotherapy well. PICC line in place. Will continue with clinical monitoring for side effects.  2. Leukocytosis. Blood cell count have increased from 11.2 on 12/22/2014 to 19.6 today. Platelets are stable. No identifiable source of infection. Likely this is related to her Neulasta injection. Will monitor closely for any source of infection. Will trend CBCs daily.  3. Tumor lysis syndrome: Resolving. Uric acid level 9.4 on 12/19/2014. Today it is < 0.5. She received aggressive IVF and 1  dose of Rasburicase on admission. Currently continuing with allopurinol.  Case discussed with my attending Dr Lindi Adie who will also co-sign my note.   Signed:  Jessee Avers, MD PGY-3 Internal Medicine Teaching Service Pager: [phone] 12/23/2014, 5:39 PM   Attending Note  I personally saw the patient, reviewed the chart and examined the patient. The plan of care was discussed with the patient. I agree with the assessment and plan as documented above.   1. DLBCL: Day 2 of chemo with R-EPOCH tol chemo very well Monitoring for toxicities  2. Tumor Lysis syndrome: S/P rasburicase now on allopurinol until Friday

## 2014-12-23 NOTE — Care Management Note (Signed)
CARE MANAGEMENT NOTE 12/23/2014  Patient:  Lindsey Irwin, Lindsey Irwin   Account Number:  [id number]  Date Initiated:  12/23/2014  Documentation initiated by:  Marney Doctor  Subjective/Objective Assessment:   18 yo admitted with Diffuse large B cell Lymphoma for Chemotherapy     Action/Plan:   From home with parent   Anticipated DC Date:  12/27/2014   Anticipated DC Plan:  [place]  CM consult      Choice offered to / List presented to:             Status of service:  In process, will continue to follow Medicare Important Message given?   (If response is "NO", the following Medicare IM given date fields will be blank) Date Medicare IM given:   Medicare IM given by:   Date Additional Medicare IM given:   Additional Medicare IM given by:    Discharge Disposition:    Per UR Regulation:  Reviewed for med. necessity/level of care/duration of stay  If discussed at [place] of Stay Meetings, dates discussed:    Comments:  12/23/14 Marney Doctor RN,BSN,NCM [phone] Chart reviewed and CM following for DC needs.

## 2014-12-24 LAB — CBC WITH DIFFERENTIAL/PLATELET
BASOS ABS: 0 10*3/uL (ref 0.0–0.1)
Basophils Relative: 0 % (ref 0–1)
EOS ABS: 0 10*3/uL (ref 0.0–0.7)
Eosinophils Relative: 0 % (ref 0–5)
HCT: 40 % (ref 36.0–46.0)
HEMOGLOBIN: 12.6 g/dL (ref 12.0–15.0)
LYMPHS ABS: 0.5 10*3/uL — AB (ref 0.7–4.0)
Lymphocytes Relative: 3 % — ABNORMAL LOW (ref 12–46)
MCH: 27.9 pg (ref 26.0–34.0)
MCHC: 31.5 g/dL (ref 30.0–36.0)
MCV: 88.7 fL (ref 78.0–100.0)
Monocytes Absolute: 0.2 10*3/uL (ref 0.1–1.0)
Monocytes Relative: 1 % — ABNORMAL LOW (ref 3–12)
NEUTROS ABS: 15.6 10*3/uL — AB (ref 1.7–7.7)
NEUTROS PCT: 96 % — AB (ref 43–77)
Platelets: 320 10*3/uL (ref 150–400)
RBC: 4.51 MIL/uL (ref 3.87–5.11)
RDW: 17.8 % — ABNORMAL HIGH (ref 11.5–15.5)
WBC: 16.2 10*3/uL — AB (ref 4.0–10.5)

## 2014-12-24 LAB — COMPREHENSIVE METABOLIC PANEL
ALK PHOS: 89 U/L (ref 39–117)
ALT: 16 U/L (ref 0–35)
AST: 17 U/L (ref 0–37)
Albumin: 4 g/dL (ref 3.5–5.2)
Anion gap: 8 (ref 5–15)
BILIRUBIN TOTAL: 0.5 mg/dL (ref 0.3–1.2)
BUN: 11 mg/dL (ref 6–23)
CHLORIDE: 107 mmol/L (ref 96–112)
CO2: 25 mmol/L (ref 19–32)
CREATININE: 0.53 mg/dL (ref 0.50–1.10)
Calcium: 9.5 mg/dL (ref 8.4–10.5)
GFR calc Af Amer: >90 mL/min (ref >90)
Glucose, Bld: 120 mg/dL — ABNORMAL HIGH (ref 70–99)
Potassium: 3.9 mmol/L (ref 3.5–5.1)
SODIUM: 140 mmol/L (ref 135–145)
Total Protein: 6.9 g/dL (ref 6.0–8.3)

## 2014-12-24 LAB — URIC ACID: URIC ACID, SERUM: 1 mg/dL — AB (ref 2.4–7.0)

## 2014-12-24 NOTE — Progress Notes (Signed)
Lindsey Irwin   DOB:[birth year]   CB#:[id number]   BWG#:[id number]  Subjective:  Doing well this morning. Afebrile.  No overnight events.  She is continuing with chemotherapy   . sodium chloride 50 mL/hr at 12/22/14 0943  . sodium chloride     . allopurinol  100 mg Oral Daily  . DOXOrubicin/vinCRIStine/etoposide CHEMO IV infusion for Inpatient CI   Intravenous Q24H  . enoxaparin (LOVENOX) injection  40 mg Subcutaneous Daily  . ondansetron (ZOFRAN) with dexamethasone (DECADRON) IV   Intravenous Q24H  . ondansetron (ZOFRAN) IV  8 mg Intravenous 3 times per day  acetaminophen, HYDROcodone-acetaminophen, ondansetron **OR** ondansetron **OR** ondansetron (ZOFRAN) IV **OR** ondansetron (ZOFRAN) IV, prochlorperazine, sodium chloride, sodium chloride Objective:  Filed Vitals:   12/24/14 0554  BP: 100/55  Pulse: 62  Temp: 97.6 F (36.4 C)  Resp: 16    Body mass index is 35.53 kg/(m^2).  Intake/Output Summary (Last 24 hours) at 12/24/14 0800 Last data filed at 12/24/14 0600  Gross per 24 hour  Intake   2436 ml  Output      0 ml  Net   2436 ml     Sclerae unicteric  Lungs clear -- no rales or rhonchi  Heart regular rate and rhythm  Abdomen benign  MSK no focal spinal tenderness, no peripheral edema  Neuro nonfocal  Extremities: no edema. Moves all extremities  CBG (last 3)  No results for input(s): GLUCAP in the last 72 hours.   Labs:  Lab Results  Component Value Date   WBC 16.2* 12/24/2014   HGB 12.6 12/24/2014   HCT 40.0 12/24/2014   MCV 88.7 12/24/2014   PLT 320 12/24/2014   NEUTROABS 15.6* 17/79/3903   Basic Metabolic Panel:  Recent Labs Lab 12/19/14 1503  12/22/14 0940 12/23/14 0428 12/24/14 0428  NA 143  --  140 141 140  K 3.9  < > 4.3 4.3 3.9  CL  --   --  107 108 107  CO2 23  --  26 26 25   GLUCOSE 83  --  100* 119* 120*  BUN 7.1  --  12 11 11   CREATININE 0.8  --  0.69 0.60 0.53  CALCIUM 10.0  --  9.6 9.8 9.5  < > = values in this interval not  displayed. GFR Estimated Creatinine Clearance: 134.8 mL/min (by C-G formula based on Cr of 0.53). Liver Function Tests:  Recent Labs Lab 12/19/14 1503 12/22/14 0940 12/23/14 0428 12/24/14 0428  AST 17 21 17 17   ALT 11 16 17 16   ALKPHOS 169* 107 102 89  BILITOT 0.27 0.4 0.5 0.5  PROT 6.9 6.9 6.7 6.9  ALBUMIN 4.0 3.9 3.8 4.0    CBC:  Recent Labs Lab 12/19/14 1503 12/22/14 0940 12/23/14 0428 12/24/14 0428  WBC 28.5* 11.2* 19.6* 16.2*  NEUTROABS 26.2* 9.3* 19.0* 15.6*  HGB 13.0 12.6 12.7 12.6  HCT 40.5 40.5 40.8 40.0  MCV 87.7 89.8 88.9 88.7  PLT 271 306 314 320   CBG:  Recent Labs Lab 12/17/14 1058  GLUCAP 94    Reviewed vital signs, medication list and all available labs from today.    Assessment: 19 y.o. woman with past medical history of diffuse large B-cell lymphoma, stage IV on chemotherapy, was admitted for chemotherapy and treatment for early tumor lysis syndrome. Clinically stable and tolerating chemotherapy excellently.  Plan: 1. Diffuse large B-cell lymphoma: On chemotherapy with R-EPOCH. Doing cycle 6 and this is day #3 of her treatment cycle. She has done  well so far with chemo. Will continue with clinical monitoring for side effects.  2. Leukocytosis > improving. WBC 19.6>16.2. Platelet count is normal. She remains afebrile and not identifiable source of infection on exam. Likely leukocytosis related to Neulasta injection. Will continue with clinical monitoring and daily cbc while on chemotherapy.   3. Tumor lysis syndrome> resolved.  Uric acid level 9.4 on 12/19/2014 and has since normalized after aggressive IVF and 1 dose of Rasburicase. She is now doing allopurinol for the next 2 days.  Case discussed with my attending Dr Lindi Adie who will also co-sign my note.   Signed:  Jessee Avers, MD PGY-3 Internal Medicine Teaching Service Pager: [phone] 12/24/2014, 8:00 AM   Attending Note  I personally saw the patient, reviewed the chart and  examined the patient. The plan of care was discussed with the patient. I agree with the assessment and plan as documented above.   1. DLBCL: day 3 of chemo. Tolerating it well 2. Tumor lysis: resolved 3. Monitoring for toxicities

## 2014-12-24 NOTE — Progress Notes (Signed)
Chemo dosages and calculations  checked with Aldean Baker RN,

## 2014-12-25 LAB — COMPREHENSIVE METABOLIC PANEL
ALBUMIN: 3.8 g/dL (ref 3.5–5.2)
ALT: 17 U/L (ref 0–35)
AST: 21 U/L (ref 0–37)
Alkaline Phosphatase: 80 U/L (ref 39–117)
Anion gap: 6 (ref 5–15)
BUN: 16 mg/dL (ref 6–23)
CO2: 26 mmol/L (ref 19–32)
CREATININE: 0.72 mg/dL (ref 0.50–1.10)
Calcium: 9.3 mg/dL (ref 8.4–10.5)
Chloride: 107 mmol/L (ref 96–112)
GFR calc Af Amer: >90 mL/min (ref >90)
GFR calc non Af Amer: >90 mL/min (ref >90)
Glucose, Bld: 118 mg/dL — ABNORMAL HIGH (ref 70–99)
Potassium: 4.1 mmol/L (ref 3.5–5.1)
Sodium: 139 mmol/L (ref 135–145)
TOTAL PROTEIN: 6.6 g/dL (ref 6.0–8.3)
Total Bilirubin: 0.5 mg/dL (ref 0.3–1.2)

## 2014-12-25 LAB — CBC WITH DIFFERENTIAL/PLATELET
BASOS ABS: 0 10*3/uL (ref 0.0–0.1)
Basophils Relative: 0 % (ref 0–1)
Eosinophils Absolute: 0 10*3/uL (ref 0.0–0.7)
Eosinophils Relative: 0 % (ref 0–5)
HEMATOCRIT: 39.3 % (ref 36.0–46.0)
HEMOGLOBIN: 12.5 g/dL (ref 12.0–15.0)
LYMPHS ABS: 0.5 10*3/uL — AB (ref 0.7–4.0)
LYMPHS PCT: 3 % — AB (ref 12–46)
MCH: 28.3 pg (ref 26.0–34.0)
MCHC: 31.8 g/dL (ref 30.0–36.0)
MCV: 88.9 fL (ref 78.0–100.0)
MONO ABS: 0.3 10*3/uL (ref 0.1–1.0)
MONOS PCT: 2 % — AB (ref 3–12)
Neutro Abs: 13.5 10*3/uL — ABNORMAL HIGH (ref 1.7–7.7)
Neutrophils Relative %: 95 % — ABNORMAL HIGH (ref 43–77)
Platelets: 346 10*3/uL (ref 150–400)
RBC: 4.42 MIL/uL (ref 3.87–5.11)
RDW: 17.5 % — AB (ref 11.5–15.5)
WBC: 14.3 10*3/uL — AB (ref 4.0–10.5)

## 2014-12-25 NOTE — Progress Notes (Signed)
Lindsey Irwin   DOB:12/10/1995   OZ#:[id number]   BCW#:[id number]  Subjective:  She doing well and tolerating chemo excellently.  No overnight events.    . sodium chloride 50 mL/hr at 12/25/14 0653  . sodium chloride     . allopurinol  100 mg Oral Daily  . DOXOrubicin/vinCRIStine/etoposide CHEMO IV infusion for Inpatient CI   Intravenous Q24H  . enoxaparin (LOVENOX) injection  40 mg Subcutaneous Daily  . ondansetron (ZOFRAN) with dexamethasone (DECADRON) IV   Intravenous Q24H  . ondansetron (ZOFRAN) IV  8 mg Intravenous 3 times per day  acetaminophen, HYDROcodone-acetaminophen, ondansetron **OR** ondansetron **OR** ondansetron (ZOFRAN) IV **OR** ondansetron (ZOFRAN) IV, prochlorperazine, sodium chloride, sodium chloride Objective:  Filed Vitals:   12/25/14 0417  BP: 115/51  Pulse: 60  Temp: 98 F (36.7 C)  Resp: 16    Body mass index is 35.53 kg/(m^2).  Intake/Output Summary (Last 24 hours) at 12/25/14 0818 Last data filed at 12/25/14 0388  Gross per 24 hour  Intake   1080 ml  Output   2000 ml  Net   -920 ml     Sclerae unicteric, sleeping in bed and arousable  Lungs clear -- no rales or rhonchi  Heart regular rate and rhythm  Abdomen benign  MSK no focal spinal tenderness, no peripheral edema  Neuro nonfocal  Extremities: no edema. Moves all extremities  CBG (last 3)  No results for input(s): GLUCAP in the last 72 hours.   Labs:  Lab Results  Component Value Date   WBC 14.3* 12/25/2014   HGB 12.5 12/25/2014   HCT 39.3 12/25/2014   MCV 88.9 12/25/2014   PLT 346 12/25/2014   NEUTROABS 13.5* 82/80/0349   Basic Metabolic Panel:  Recent Labs Lab 12/19/14 1503  12/22/14 0940 12/23/14 0428 12/24/14 0428  NA 143  --  140 141 140  K 3.9  < > 4.3 4.3 3.9  CL  --   --  107 108 107  CO2 23  --  26 26 25   GLUCOSE 83  --  100* 119* 120*  BUN 7.1  --  12 11 11   CREATININE 0.8  --  0.69 0.60 0.53  CALCIUM 10.0  --  9.6 9.8 9.5  < > = values in this interval not  displayed. GFR Estimated Creatinine Clearance: 134.8 mL/min (by C-G formula based on Cr of 0.53). Liver Function Tests:  Recent Labs Lab 12/19/14 1503 12/22/14 0940 12/23/14 0428 12/24/14 0428  AST 17 21 17 17   ALT 11 16 17 16   ALKPHOS 169* 107 102 89  BILITOT 0.27 0.4 0.5 0.5  PROT 6.9 6.9 6.7 6.9  ALBUMIN 4.0 3.9 3.8 4.0    CBC:  Recent Labs Lab 12/19/14 1503 12/22/14 0940 12/23/14 0428 12/24/14 0428 12/25/14 0408  WBC 28.5* 11.2* 19.6* 16.2* 14.3*  NEUTROABS 26.2* 9.3* 19.0* 15.6* 13.5*  HGB 13.0 12.6 12.7 12.6 12.5  HCT 40.5 40.5 40.8 40.0 39.3  MCV 87.7 89.8 88.9 88.7 88.9  PLT 271 306 314 320 346   CBG: No results for input(s): GLUCAP in the last 168 hours.  I personally reviewed vital signs, medication list and all available labs    Assessment: 19 y.o. woman with past medical history of diffuse large B-cell lymphoma, stage IV on chemotherapy, was admitted for chemotherapy and treatment for early tumor lysis syndrome. Clinically stable and tolerating chemotherapy excellently.  Plan: 1. Diffuse large B-cell lymphoma: On chemotherapy with R-EPOCH. She is doing her Cycle 6 and this is  day #4 of her treatment cycle. She is tolerating chemo excellently. No signs of toxicity. Labs including hemoglobin and platelets are stable. Will continue with clinical monitoring for side effects.  2. Leukocytosis > improving. WBC 19.6>16.2>14.3. Platelet count is stable. She remains afebrile and not identifiable source of infection on exam. Likely leukocytosis related to Neulasta injection. Continue with daily CBCs  3. Tumor lysis syndrome> resolved.  Status post aggressive IVF and 1 dose of Rasburicase. On Allopurinol to complete tomorrow.  Case discussed with my attending Dr Lindi Adie who will also co-sign my note.   Signed:  Jessee Avers, MD PGY-3 Internal Medicine Teaching Service Pager: [phone] 12/25/2014, 8:18 AM    Attending Note  I personally saw the patient,  reviewed the chart and examined the patient. The plan of care was discussed with the patient. I agree with the assessment and plan as documented above.   SUBJECTIVE: Denies any side effects from chemo   OBJECTIVE PHYSICAL EXAMINATION: ECOG PERFORMANCE STATUS: 0 - Asymptomatic  Filed Vitals:   12/25/14 1500  BP: 110/61  Pulse: 68  Temp: 98.2 F (36.8 C)  Resp: 16   Filed Weights   12/22/14 0921  Weight: 220 lb (99.791 kg)    GENERAL:alert, no distress and comfortable SKIN: skin color, texture, turgor are normal, no rashes or significant lesions EYES: normal, Conjunctiva are pink and non-injected, sclera clear OROPHARYNX:no exudate, no erythema and lips, buccal mucosa, and tongue normal  NECK: supple, thyroid normal size, non-tender, without nodularity LYMPH:  no palpable lymphadenopathy in the cervical, axillary or inguinal LUNGS: clear to auscultation and percussion with normal breathing effort HEART: regular rate & rhythm and no murmurs and no lower extremity edema ABDOMEN:abdomen soft, non-tender and normal bowel sounds Musculoskeletal:no cyanosis of digits and no clubbing  NEURO: alert & oriented x 3 with fluent speech, no focal motor/sensory deficits  LABORATORY DATA:  I have reviewed the data as listed CMP Latest Ref Rng 12/25/2014 12/24/2014 12/23/2014  Glucose 70 - 99 mg/dL 118(H) 120(H) 119(H)  BUN 6 - 23 mg/dL 16 11 11   Creatinine 0.50 - 1.10 mg/dL 0.72 0.53 0.60  Sodium 135 - 145 mmol/L 139 140 141  Potassium 3.5 - 5.1 mmol/L 4.1 3.9 4.3  Chloride 96 - 112 mmol/L 107 107 108  CO2 19 - 32 mmol/L 26 25 26   Calcium 8.4 - 10.5 mg/dL 9.3 9.5 9.8  Total Protein 6.0 - 8.3 g/dL 6.6 6.9 6.7  Total Bilirubin 0.3 - 1.2 mg/dL 0.5 0.5 0.5  Alkaline Phos 39 - 117 U/L 80 89 102  AST 0 - 37 U/L 21 17 17   ALT 0 - 35 U/L 17 16 17       Lab Results  Component Value Date   WBC 14.3* 12/25/2014   HGB 12.5 12/25/2014   HCT 39.3 12/25/2014   MCV 88.9 12/25/2014   PLT 346  12/25/2014   NEUTROABS 13.5* 12/25/2014    ASSESSMENT AND PLAN: 1. DLBCL: Cycle 6 chemo to be completed 3.30 PM tomorrow. 2. Neulasta will need to be done Monday 3. Discharge tomorrow after chemo

## 2014-12-25 NOTE — Progress Notes (Signed)
Manual calculation of BSA and dosing for Doxorubicin, Etoposide, and Vincristine completed and verified independently by second nurse Drue Dun, RN

## 2014-12-26 LAB — COMPREHENSIVE METABOLIC PANEL
ALBUMIN: 3.9 g/dL (ref 3.5–5.2)
ALT: 15 U/L (ref 0–35)
AST: 13 U/L (ref 0–37)
Alkaline Phosphatase: 71 U/L (ref 39–117)
Anion gap: 6 (ref 5–15)
BILIRUBIN TOTAL: 0.7 mg/dL (ref 0.3–1.2)
BUN: 12 mg/dL (ref 6–23)
CHLORIDE: 100 mmol/L (ref 96–112)
CO2: 27 mmol/L (ref 19–32)
CREATININE: 0.52 mg/dL (ref 0.50–1.10)
Calcium: 9.2 mg/dL (ref 8.4–10.5)
GFR calc Af Amer: >90 mL/min (ref >90)
Glucose, Bld: 101 mg/dL — ABNORMAL HIGH (ref 70–99)
Potassium: 3.5 mmol/L (ref 3.5–5.1)
Sodium: 133 mmol/L — ABNORMAL LOW (ref 135–145)
Total Protein: 6.7 g/dL (ref 6.0–8.3)

## 2014-12-26 LAB — CBC WITH DIFFERENTIAL/PLATELET
Basophils Absolute: 0 10*3/uL (ref 0.0–0.1)
Basophils Relative: 0 % (ref 0–1)
EOS ABS: 0 10*3/uL (ref 0.0–0.7)
Eosinophils Relative: 0 % (ref 0–5)
HCT: 40.1 % (ref 36.0–46.0)
Hemoglobin: 12.7 g/dL (ref 12.0–15.0)
LYMPHS ABS: 0.5 10*3/uL — AB (ref 0.7–4.0)
Lymphocytes Relative: 6 % — ABNORMAL LOW (ref 12–46)
MCH: 27.6 pg (ref 26.0–34.0)
MCHC: 31.7 g/dL (ref 30.0–36.0)
MCV: 87.2 fL (ref 78.0–100.0)
Monocytes Absolute: 0.2 10*3/uL (ref 0.1–1.0)
Monocytes Relative: 2 % — ABNORMAL LOW (ref 3–12)
NEUTROS PCT: 92 % — AB (ref 43–77)
Neutro Abs: 7.7 10*3/uL (ref 1.7–7.7)
PLATELETS: 353 10*3/uL (ref 150–400)
RBC: 4.6 MIL/uL (ref 3.87–5.11)
RDW: 17 % — ABNORMAL HIGH (ref 11.5–15.5)
WBC: 8.4 10*3/uL (ref 4.0–10.5)

## 2014-12-26 MED ORDER — SODIUM CHLORIDE 0.9 % IV SOLN
750.0000 mg/m2 | Freq: Once | INTRAVENOUS | Status: AC
  Administered 2014-12-26: 1520 mg via INTRAVENOUS
  Filled 2014-12-26: qty 76

## 2014-12-26 MED ORDER — POLYETHYLENE GLYCOL 3350 17 G PO PACK
17.0000 g | PACK | Freq: Once | ORAL | Status: AC
  Administered 2014-12-26: 17 g via ORAL

## 2014-12-26 MED ORDER — SODIUM CHLORIDE 0.9 % IV SOLN
Freq: Once | INTRAVENOUS | Status: AC
  Administered 2014-12-26: 16 mg via INTRAVENOUS
  Filled 2014-12-26: qty 8

## 2014-12-26 NOTE — Progress Notes (Signed)
MD, Patient has not had BM since the 8th.  Could she have something this am for constipation please?Lindsey Irwin

## 2014-12-26 NOTE — Progress Notes (Signed)
Manual calculation of BSA and dose for Cytoxan completed - second verification by Aldean Baker, RN

## 2014-12-26 NOTE — Discharge Summary (Signed)
Patient ID: Lindsey Irwin MRN: [id number] DOB/AGE: [birth year] 19 y.o.  Admit date: 12/22/2014 Discharge date: 12/26/2014  Patient Care Team: No Pcp Per Patient as PCP - General (General Practice)  Brief History of Present Illness: For complete details please refer to admission H and P, but in brief, Lindsey Irwin is a 19 year old female with a history of Diffuse large B Cell Lymphoma, admitted to the hospital for the initiation of Cycle 6 of Chemotherapy with R-EPOCH.   Discharge Diagnoses/Hospital Course:  Diffuse large B cell lymphoma She was admitted for cycle 6 of infusional R-EPOCH, from day 1-5 and every 21 days conventional way without dose adjustment. Treatment proceeded without further doses of intrathecal methotrexate.  She has tolerated her treatment well without complications or side effects. Her PICC line will be discontinued and removed after her treatment is completed. On 12/29/2014, she will receive Procrit injection along with Neulasta. She will need monthly Lupron injection to prevent menstruation and preserve fertility, next due 01/05/15. She will return to the [facility] on a weekly basis for supportive care.  J[employer]ah[employer] Witness Due to her religious beliefs, she will not receive blood products No signs of anemia noticed at this time She will receive Procrit injection weekly as preventive strategy against the need for transfusion ( last dose on 12/15/14)  Leukocytosis This is related to recent Neulasta injection on 12/08/14. This has resolved No signs of infection have been noted throughout hospitalization  Hyperuricemia Cause is unknown. She was monitored for tumor lysis syndrome, as her uric acid on admission was 9.4. She received 1 dose of rasburicase along with IV fluids on 12/22/14 She also received allopurinol for 5 days To date, no signs of tumor lysis. Her LDH as of 2/8 is 271 and Uric Acid is 1.0 as of 2/10  DVT prophylaxis She was started on Lovenox  throughout hospitalization No bleeding issues reported Lovenox discontinued upon discharge  Constipation Due to decreased mobility Last bowel movement on 2/8.  She is to receive Miralax this morning  Full Code  Procedures: S/P Chemotherapy with EPOCH Days 1 through 5, Cycle 6 from 2/8-2/12/16                                        Rituxan on Day 1, 12/22/14  Consultations: None   Past Medical History  Diagnosis Date  . Breast mass 08/2014  . Sore throat 08/25/2014  . Cough 08/2014  . Nasal congestion 08/2014  . Diffuse large B cell lymphoma 09/02/2014  . Insomnia 09/24/2014    Discharge Medications:    Medication List    STOP taking these medications        pegfilgrastim 6 MG/0.6ML injection  Commonly known as:  NEULASTA     PRESCRIPTION MEDICATION      TAKE these medications        HYDROcodone-acetaminophen 5-325 MG per tablet  Commonly known as:  NORCO/VICODIN  Take 1 tablet by mouth every 6 (six) hours as needed for moderate pain.     ondansetron 8 MG tablet  Commonly known as:  ZOFRAN  Take 1 tablet (8 mg total) by mouth every 8 (eight) hours as needed for nausea.     prochlorperazine 10 MG tablet  Commonly known as:  COMPAZINE  Take 1 tablet (10 mg total) by mouth every 6 (six) hours as needed for nausea.  Discharge Condition: Improved.  Diet recommendation:  Regular.  Disposition and Follow-up:                  Procrit and Neulasta injection 2/15 3 pm                 Lupron Injection 2/22 3 pm                 3/7 2:45 pm  labs                 3/7 3:15 pm Dr Alvy Bimler folllow up visit  ECOG PERFORMANCE STATUS: 0  Physical Exam at Discharge:  Subjective :Denies fever, chills or night sweats. Denies shortness of breath or chest pain. Denies nausea, vomiting, diarrhea or mucositis. Appetite is normal. She is constipated, last bowel movement on 2/8, received Miralax today. Denies dysuria. Denies any headaches, vision changes or dizziness.  Denies any rashes. No bleeding issues. She has not been ambulating much. No confusion reported.  Objective:  BP 114/63 mmHg  Pulse 55  Temp(Src) 97.7 F (36.5 C) (Oral)  Resp 16  Ht 5\' 6"  (1.676 m)  Wt 220 lb (99.791 kg)  BMI 35.53 kg/m2  SpO2 100%  LMP 09/03/2014  GENERAL: alert, no distress and comfortable SKIN: skin color, texture, turgor are normal, no rashes or significant lesions EYES: normal, conjunctiva are pink and non-injected, sclera clear OROPHARYNX:no exudate, no erythema and lips, buccal mucosa, and tongue normal  NECK: supple, thyroid normal size, non-tender, without nodularity LYMPH:  no palpable lymphadenopathy in the cervical, axillary or inguinal area LUNGS: clear to auscultation and percussion with normal breathing effort HEART: regular rate & rhythm and no murmurs and no lower extremity edema. Right PICC line normal ABDOMEN:abdomen soft, non-tender and normal bowel sounds Musculoskeletal:no cyanosis of digits and no clubbing  PSYCH: alert & oriented x 3 with fluent speech NEURO: no focal motor/sensory deficits    Significant Diagnostic Studies:  None during this admission  Discharge Laboratory Values:  CBC  Recent Labs Lab 12/22/14 0940 12/23/14 0428 12/24/14 0428 12/25/14 0408 12/26/14 0530  WBC 11.2* 19.6* 16.2* 14.3* 8.4  HGB 12.6 12.7 12.6 12.5 12.7  HCT 40.5 40.8 40.0 39.3 40.1  PLT 306 314 320 346 353  MCV 89.8 88.9 88.7 88.9 87.2  MCH 27.9 27.7 27.9 28.3 27.6  MCHC 31.1 31.1 31.5 31.8 31.7  RDW 18.4* 18.0* 17.8* 17.5* 17.0*  LYMPHSABS 1.3 0.5* 0.5* 0.5* 0.5*  MONOABS 0.6 0.1 0.2 0.3 0.2  EOSABS 0.1 0.0 0.0 0.0 0.0  BASOSABS 0.0 0.0 0.0 0.0 0.0     Chemistries   Recent Labs Lab 12/22/14 0940 12/23/14 0428 12/24/14 0428 12/25/14 0408 12/26/14 0530  NA 140 141 140 139 133*  K 4.3 4.3 3.9 4.1 3.5  CL 107 108 107 107 100  CO2 26 26 25 26 27   GLUCOSE 100* 119* 120* 118* 101*  BUN 12 11 11 16 12   CREATININE 0.69 0.60 0.53  0.72 0.52  CALCIUM 9.6 9.8 9.5 9.3 9.2      Signed: Sharene Butters, PA-C 12/26/2014, 9:22 AM

## 2014-12-29 ENCOUNTER — Ambulatory Visit (HOSPITAL_BASED_OUTPATIENT_CLINIC_OR_DEPARTMENT_OTHER): Payer: Medicaid Other

## 2014-12-29 DIAGNOSIS — Z789 Other specified health status: Secondary | ICD-10-CM

## 2014-12-29 DIAGNOSIS — C8330 Diffuse large B-cell lymphoma, unspecified site: Secondary | ICD-10-CM

## 2014-12-29 DIAGNOSIS — IMO0001 Reserved for inherently not codable concepts without codable children: Secondary | ICD-10-CM

## 2014-12-29 DIAGNOSIS — Z5189 Encounter for other specified aftercare: Secondary | ICD-10-CM

## 2014-12-29 MED ORDER — EPOETIN ALFA 40000 UNIT/ML IJ SOLN
40000.0000 [IU] | INTRAMUSCULAR | Status: DC
  Administered 2014-12-29: 40000 [IU] via SUBCUTANEOUS

## 2014-12-29 MED ORDER — PEGFILGRASTIM INJECTION 6 MG/0.6ML
6.0000 mg | Freq: Once | SUBCUTANEOUS | Status: AC
  Administered 2014-12-29: 6 mg via SUBCUTANEOUS
  Filled 2014-12-29: qty 0.6

## 2015-01-05 ENCOUNTER — Ambulatory Visit (HOSPITAL_BASED_OUTPATIENT_CLINIC_OR_DEPARTMENT_OTHER): Payer: Medicaid Other

## 2015-01-05 DIAGNOSIS — C8330 Diffuse large B-cell lymphoma, unspecified site: Secondary | ICD-10-CM

## 2015-01-05 DIAGNOSIS — Z5189 Encounter for other specified aftercare: Secondary | ICD-10-CM

## 2015-01-05 DIAGNOSIS — Z789 Other specified health status: Secondary | ICD-10-CM

## 2015-01-05 DIAGNOSIS — IMO0001 Reserved for inherently not codable concepts without codable children: Secondary | ICD-10-CM

## 2015-01-05 MED ORDER — LEUPROLIDE ACETATE 7.5 MG IM KIT
7.5000 mg | PACK | INTRAMUSCULAR | Status: DC
  Administered 2015-01-05: 7.5 mg via INTRAMUSCULAR
  Filled 2015-01-05: qty 7.5

## 2015-01-05 MED ORDER — EPOETIN ALFA 40000 UNIT/ML IJ SOLN
40000.0000 [IU] | INTRAMUSCULAR | Status: DC
  Administered 2015-01-05: 40000 [IU] via SUBCUTANEOUS
  Filled 2015-01-05: qty 1

## 2015-01-19 ENCOUNTER — Encounter: Payer: Self-pay | Admitting: Hematology and Oncology

## 2015-01-19 ENCOUNTER — Ambulatory Visit (HOSPITAL_BASED_OUTPATIENT_CLINIC_OR_DEPARTMENT_OTHER): Payer: Medicaid Other | Admitting: Hematology and Oncology

## 2015-01-19 ENCOUNTER — Other Ambulatory Visit (HOSPITAL_BASED_OUTPATIENT_CLINIC_OR_DEPARTMENT_OTHER): Payer: Medicaid Other

## 2015-01-19 ENCOUNTER — Telehealth: Payer: Self-pay | Admitting: Hematology and Oncology

## 2015-01-19 ENCOUNTER — Other Ambulatory Visit: Payer: Self-pay | Admitting: Hematology and Oncology

## 2015-01-19 VITALS — BP 115/68 | HR 92 | Temp 98.3°F | Resp 18 | Ht 66.0 in | Wt 228.3 lb

## 2015-01-19 DIAGNOSIS — C8330 Diffuse large B-cell lymphoma, unspecified site: Secondary | ICD-10-CM

## 2015-01-19 LAB — CBC WITH DIFFERENTIAL/PLATELET
BASO%: 0.8 % (ref 0.0–2.0)
BASOS ABS: 0.1 10*3/uL (ref 0.0–0.1)
EOS%: 1.1 % (ref 0.0–7.0)
Eosinophils Absolute: 0.1 10*3/uL (ref 0.0–0.5)
HEMATOCRIT: 39.4 % (ref 34.8–46.6)
HEMOGLOBIN: 12.5 g/dL (ref 11.6–15.9)
LYMPH%: 16.7 % (ref 14.0–49.7)
MCH: 27.7 pg (ref 25.1–34.0)
MCHC: 31.6 g/dL (ref 31.5–36.0)
MCV: 87.6 fL (ref 79.5–101.0)
MONO#: 0.5 10*3/uL (ref 0.1–0.9)
MONO%: 7.3 % (ref 0.0–14.0)
NEUT#: 4.9 10*3/uL (ref 1.5–6.5)
NEUT%: 74.1 % (ref 38.4–76.8)
PLATELETS: 399 10*3/uL (ref 145–400)
RBC: 4.5 10*6/uL (ref 3.70–5.45)
RDW: 18.7 % — ABNORMAL HIGH (ref 11.2–14.5)
WBC: 6.6 10*3/uL (ref 3.9–10.3)
lymph#: 1.1 10*3/uL (ref 0.9–3.3)

## 2015-01-19 LAB — COMPREHENSIVE METABOLIC PANEL (CC13)
ALBUMIN: 3.7 g/dL (ref 3.5–5.0)
ALK PHOS: 98 U/L (ref 40–150)
ALT: 6 U/L (ref 0–55)
ANION GAP: 9 meq/L (ref 3–11)
AST: 11 U/L (ref 5–34)
BUN: 8.8 mg/dL (ref 7.0–26.0)
CO2: 26 meq/L (ref 22–29)
Calcium: 9.4 mg/dL (ref 8.4–10.4)
Chloride: 110 mEq/L — ABNORMAL HIGH (ref 98–109)
Creatinine: 0.7 mg/dL (ref 0.6–1.1)
GLUCOSE: 95 mg/dL (ref 70–140)
Potassium: 4 mEq/L (ref 3.5–5.1)
SODIUM: 144 meq/L (ref 136–145)
TOTAL PROTEIN: 6.5 g/dL (ref 6.4–8.3)
Total Bilirubin: <0.2 mg/dL (ref 0.20–1.20)

## 2015-01-19 LAB — URIC ACID (CC13): Uric Acid, Serum: 4.5 mg/dl (ref 2.6–7.4)

## 2015-01-19 LAB — LACTATE DEHYDROGENASE (CC13): LDH: 224 U/L (ref 125–245)

## 2015-01-19 NOTE — Telephone Encounter (Signed)
gv and printed appt sched anda vs for pt for may °

## 2015-01-19 NOTE — Assessment & Plan Note (Signed)
She tolerated last treatment well. I plan to repeat PET CT scan in May, 3 months away from her last imaging. Clinically, she has achieved complete remission. I will see her back in May with repeat blood work, imaging study and examination. We discussed about the role of bone marrow transplant but the patient declined.

## 2015-01-19 NOTE — Progress Notes (Signed)
[place] OFFICE PROGRESS NOTE  Patient Care Team: No Pcp Per Patient as PCP - General (General Practice)  SUMMARY OF ONCOLOGIC HISTORY: Oncology History   Diffuse large B cell lymphoma   Primary site: Lymphoid Neoplasms   Staging method: AJCC 6th Edition   Clinical: Stage IV signed by Heath Lark, MD on 09/03/2014  8:18 PM   Summary: Stage IV IPI of 3: high LDH, >2 extranodal disease and stage IV at presentation       Diffuse large B cell lymphoma   07/30/2014 Imaging She had a bilateral mammogram which came back abnormal. Ultrasound-guided biopsy was performed, suspicious for lymphoma   08/25/2014 Imaging PET/CT scan showed diffuse metastatic cancer involving bilateral breasts, bone, and large abdominal mass   08/28/2014 Surgery She had excisional surgery and removal of right breast mass   08/28/2014 Pathology Results Accession: [id number] right breast mass is consistent with diffuse large B-cell lymphoma with high proliferation index.   09/04/2014 - 12/26/2014 Hospital Admission she ad 6 cycles of rituximab-EPOCH plus 4 cycles of CNS prophylaxis with IT MTX   09/04/2014 Imaging echocardiogram is normal.   09/04/2014 Procedure she has placement of PICC line.   10/14/2014 Imaging PET/CT scan showed near complete response to treatment.   12/17/2014 Imaging Repeat PET CT scan showed near complete response to treatment.    INTERVAL HISTORY: Please see below for problem oriented charting. She is seen for further supportive care. Her hot flashes are gone. She still remained amenorrheic since chemotherapy.  REVIEW OF SYSTEMS:   Constitutional: Denies fevers, chills or abnormal weight loss Eyes: Denies blurriness of vision Ears, nose, mouth, throat, and face: Denies mucositis or sore throat Respiratory: Denies cough, dyspnea or wheezes Cardiovascular: Denies palpitation, chest discomfort or lower extremity swelling Gastrointestinal:  Denies nausea, heartburn or change in  bowel habits Skin: Denies abnormal skin rashes Lymphatics: Denies new lymphadenopathy or easy bruising Neurological:Denies numbness, tingling or new weaknesses Behavioral/Psych: Mood is stable, no new changes  All other systems were reviewed with the patient and are negative.  I have reviewed the past medical history, past surgical history, social history and family history with the patient and they are unchanged from previous note.  ALLERGIES:  is allergic to other and soap.  MEDICATIONS:  No current outpatient prescriptions on file.   No current facility-administered medications for this visit.    PHYSICAL EXAMINATION: ECOG PERFORMANCE STATUS: 0 - Asymptomatic  Filed Vitals:   01/19/15 1531  BP: 115/68  Pulse: 92  Temp: 98.3 F (36.8 C)  Resp: 18   Filed Weights   01/19/15 1531  Weight: 228 lb 4.8 oz (103.556 kg)    GENERAL:alert, no distress and comfortable SKIN: skin color, texture, turgor are normal, no rashes or significant lesions EYES: normal, Conjunctiva are pink and non-injected, sclera clear OROPHARYNX:no exudate, no erythema and lips, buccal mucosa, and tongue normal  NECK: supple, thyroid normal size, non-tender, without nodularity LYMPH:  no palpable lymphadenopathy in the cervical, axillary or inguinal LUNGS: clear to auscultation and percussion with normal breathing effort HEART: regular rate & rhythm and no murmurs and no lower extremity edema ABDOMEN:abdomen soft, non-tender and normal bowel sounds Musculoskeletal:no cyanosis of digits and no clubbing  NEURO: alert & oriented x 3 with fluent speech, no focal motor/sensory deficits  LABORATORY DATA:  I have reviewed the data as listed    Component Value Date/Time   NA 133* 12/26/2014 0530   NA 143 12/19/2014 1503   K 3.5 12/26/2014  0530   K 3.9 12/19/2014 1503   CL 100 12/26/2014 0530   CO2 27 12/26/2014 0530   CO2 23 12/19/2014 1503   GLUCOSE 101* 12/26/2014 0530   GLUCOSE 83 12/19/2014 1503    BUN 12 12/26/2014 0530   BUN 7.1 12/19/2014 1503   CREATININE 0.52 12/26/2014 0530   CREATININE 0.8 12/19/2014 1503   CALCIUM 9.2 12/26/2014 0530   CALCIUM 10.0 12/19/2014 1503   PROT 6.7 12/26/2014 0530   PROT 6.9 12/19/2014 1503   ALBUMIN 3.9 12/26/2014 0530   ALBUMIN 4.0 12/19/2014 1503   AST 13 12/26/2014 0530   AST 17 12/19/2014 1503   ALT 15 12/26/2014 0530   ALT 11 12/19/2014 1503   ALKPHOS 71 12/26/2014 0530   ALKPHOS 169* 12/19/2014 1503   BILITOT 0.7 12/26/2014 0530   BILITOT 0.27 12/19/2014 1503   GFRNONAA >90 12/26/2014 0530   GFRAA >90 12/26/2014 0530    No results found for: SPEP, UPEP  Lab Results  Component Value Date   WBC 6.6 01/19/2015   NEUTROABS 4.9 01/19/2015   HGB 12.5 01/19/2015   HCT 39.4 01/19/2015   MCV 87.6 01/19/2015   PLT 399 01/19/2015      Chemistry      Component Value Date/Time   NA 133* 12/26/2014 0530   NA 143 12/19/2014 1503   K 3.5 12/26/2014 0530   K 3.9 12/19/2014 1503   CL 100 12/26/2014 0530   CO2 27 12/26/2014 0530   CO2 23 12/19/2014 1503   BUN 12 12/26/2014 0530   BUN 7.1 12/19/2014 1503   CREATININE 0.52 12/26/2014 0530   CREATININE 0.8 12/19/2014 1503      Component Value Date/Time   CALCIUM 9.2 12/26/2014 0530   CALCIUM 10.0 12/19/2014 1503   ALKPHOS 71 12/26/2014 0530   ALKPHOS 169* 12/19/2014 1503   AST 13 12/26/2014 0530   AST 17 12/19/2014 1503   ALT 15 12/26/2014 0530   ALT 11 12/19/2014 1503   BILITOT 0.7 12/26/2014 0530   BILITOT 0.27 12/19/2014 1503     ASSESSMENT & PLAN:  Diffuse large B cell lymphoma She tolerated last treatment well. I plan to repeat PET CT scan in May, 3 months away from her last imaging. Clinically, she has achieved complete remission. I will see her back in May with repeat blood work, imaging study and examination. We discussed about the role of bone marrow transplant but the patient declined.     Orders Placed This Encounter  Procedures  . NM PET Image Restag  (PS) Skull Base To Thigh    Standing Status: Future     Number of Occurrences:      Standing Expiration Date: 03/20/2016    Order Specific Question:  Reason for Exam (SYMPTOM  OR DIAGNOSIS REQUIRED)    Answer:  DLBCL<, staging after chemo    Order Specific Question:  Is the patient pregnant?    Answer:  No    Order Specific Question:  Preferred imaging location?    Answer:  [facility]  . Pregnancy, urine    Standing Status: Future     Number of Occurrences:      Standing Expiration Date: 01/19/2016   All questions were answered. The patient knows to call the clinic with any problems, questions or concerns. No barriers to learning was detected. I spent 25 minutes counseling the patient face to face. The total time spent in the appointment was 30 minutes and more than 50% was on  counseling and review of test results     [facility], [place], MD 01/19/2015 3:51 PM

## 2015-02-27 ENCOUNTER — Encounter (HOSPITAL_BASED_OUTPATIENT_CLINIC_OR_DEPARTMENT_OTHER): Payer: Self-pay

## 2015-02-27 ENCOUNTER — Emergency Department (HOSPITAL_BASED_OUTPATIENT_CLINIC_OR_DEPARTMENT_OTHER)
Admission: EM | Admit: 2015-02-27 | Discharge: 2015-02-27 | Disposition: A | Discharge status: Home / Self Care | Payer: Medicaid Other | Attending: Emergency Medicine | Admitting: Emergency Medicine

## 2015-02-27 DIAGNOSIS — Z87448 Personal history of other diseases of urinary system: Secondary | ICD-10-CM | POA: Insufficient documentation

## 2015-02-27 DIAGNOSIS — Z8709 Personal history of other diseases of the respiratory system: Secondary | ICD-10-CM | POA: Insufficient documentation

## 2015-02-27 DIAGNOSIS — S40012A Contusion of left shoulder, initial encounter: Secondary | ICD-10-CM | POA: Diagnosis not present

## 2015-02-27 DIAGNOSIS — Y92410 Unspecified street and highway as the place of occurrence of the external cause: Secondary | ICD-10-CM | POA: Diagnosis not present

## 2015-02-27 DIAGNOSIS — Y998 Other external cause status: Secondary | ICD-10-CM | POA: Insufficient documentation

## 2015-02-27 DIAGNOSIS — T148XXA Other injury of unspecified body region, initial encounter: Secondary | ICD-10-CM

## 2015-02-27 DIAGNOSIS — S4992XA Unspecified injury of left shoulder and upper arm, initial encounter: Secondary | ICD-10-CM | POA: Diagnosis present

## 2015-02-27 DIAGNOSIS — Z8669 Personal history of other diseases of the nervous system and sense organs: Secondary | ICD-10-CM | POA: Diagnosis not present

## 2015-02-27 DIAGNOSIS — Z8572 Personal history of non-Hodgkin lymphomas: Secondary | ICD-10-CM | POA: Diagnosis not present

## 2015-02-27 DIAGNOSIS — Y9389 Activity, other specified: Secondary | ICD-10-CM | POA: Diagnosis not present

## 2015-02-27 MED ORDER — CYCLOBENZAPRINE HCL 10 MG PO TABS: 10.0000 mg | ORAL_TABLET | Freq: Two times a day (BID) | ORAL | Status: DC | PRN

## 2015-02-27 NOTE — Discharge Instructions (Signed)
Take tylenol or ibuprofen as needed for pain. Take flexeril as needed for muscle spasm. Refer to attached documents for more information. Return to the ED with worsening or concerning symptoms.
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
## 2015-03-16 ENCOUNTER — Telehealth: Payer: Self-pay | Admitting: Hematology and Oncology

## 2015-03-16 ENCOUNTER — Telehealth: Payer: Self-pay | Admitting: *Deleted

## 2015-03-16 ENCOUNTER — Other Ambulatory Visit: Payer: Self-pay | Admitting: Hematology and Oncology

## 2015-03-16 DIAGNOSIS — C8330 Diffuse large B-cell lymphoma, unspecified site: Secondary | ICD-10-CM

## 2015-03-16 NOTE — Telephone Encounter (Signed)
Left VM for pt informing that her insurance will not approve PET scan at this time so order has been changed to CT scan.  Expect call from scheduler for CT scan and to see Dr. Alvy Bimler next week after CT done.

## 2015-03-16 NOTE — Telephone Encounter (Signed)
Lft msg for pt confirming MD visit per 05/02 POF and advised pt to p/u her contrast at our office or [facility]..... Mailed scheduled to pt... KJ

## 2015-03-17 ENCOUNTER — Ambulatory Visit (HOSPITAL_COMMUNITY): Payer: Medicaid Other

## 2015-03-17 ENCOUNTER — Other Ambulatory Visit: Payer: Medicaid Other

## 2015-03-19 ENCOUNTER — Telehealth: Payer: Self-pay | Admitting: Hematology and Oncology

## 2015-03-19 ENCOUNTER — Ambulatory Visit: Payer: Medicaid Other | Admitting: Hematology and Oncology

## 2015-03-19 NOTE — Telephone Encounter (Signed)
pt called to r/s labs...done....pt ok and aware of new d.t

## 2015-03-20 ENCOUNTER — Other Ambulatory Visit (HOSPITAL_COMMUNITY)
Admission: RE | Admit: 2015-03-20 | Discharge: 2015-03-20 | Disposition: A | Discharge status: Home / Self Care | Payer: Medicaid Other | Source: Ambulatory Visit | Attending: Hematology and Oncology | Admitting: Hematology and Oncology

## 2015-03-20 ENCOUNTER — Other Ambulatory Visit (HOSPITAL_BASED_OUTPATIENT_CLINIC_OR_DEPARTMENT_OTHER): Payer: Medicaid Other

## 2015-03-20 ENCOUNTER — Ambulatory Visit (HOSPITAL_COMMUNITY)
Admission: RE | Admit: 2015-03-20 | Discharge: 2015-03-20 | Disposition: A | Discharge status: Home / Self Care | Payer: Medicaid Other | Source: Ambulatory Visit | Attending: Hematology and Oncology | Admitting: Hematology and Oncology

## 2015-03-20 ENCOUNTER — Encounter (HOSPITAL_COMMUNITY): Payer: Self-pay

## 2015-03-20 DIAGNOSIS — C8330 Diffuse large B-cell lymphoma, unspecified site: Secondary | ICD-10-CM

## 2015-03-20 DIAGNOSIS — R918 Other nonspecific abnormal finding of lung field: Secondary | ICD-10-CM | POA: Diagnosis not present

## 2015-03-20 DIAGNOSIS — R938 Abnormal findings on diagnostic imaging of other specified body structures: Secondary | ICD-10-CM | POA: Diagnosis not present

## 2015-03-20 DIAGNOSIS — M898X9 Other specified disorders of bone, unspecified site: Secondary | ICD-10-CM | POA: Insufficient documentation

## 2015-03-20 LAB — CBC WITH DIFFERENTIAL/PLATELET
BASO%: 1 % (ref 0.0–2.0)
Basophils Absolute: 0.1 10*3/uL (ref 0.0–0.1)
EOS%: 6.1 % (ref 0.0–7.0)
Eosinophils Absolute: 0.4 10*3/uL (ref 0.0–0.5)
HCT: 41 % (ref 34.8–46.6)
HGB: 13.6 g/dL (ref 11.6–15.9)
LYMPH%: 15.8 % (ref 14.0–49.7)
MCH: 28.3 pg (ref 25.1–34.0)
MCHC: 33.2 g/dL (ref 31.5–36.0)
MCV: 85.1 fL (ref 79.5–101.0)
MONO#: 0.3 10*3/uL (ref 0.1–0.9)
MONO%: 4.3 % (ref 0.0–14.0)
NEUT#: 4.5 10*3/uL (ref 1.5–6.5)
NEUT%: 72.8 % (ref 38.4–76.8)
Platelets: 353 10*3/uL (ref 145–400)
RBC: 4.81 10*6/uL (ref 3.70–5.45)
RDW: 14.7 % — ABNORMAL HIGH (ref 11.2–14.5)
WBC: 6.2 10*3/uL (ref 3.9–10.3)
lymph#: 1 10*3/uL (ref 0.9–3.3)

## 2015-03-20 LAB — COMPREHENSIVE METABOLIC PANEL (CC13)
ALT: 9 U/L (ref 0–55)
AST: 10 U/L (ref 5–34)
Albumin: 4 g/dL (ref 3.5–5.0)
Alkaline Phosphatase: 115 U/L (ref 40–150)
Anion Gap: 12 mEq/L — ABNORMAL HIGH (ref 3–11)
BUN: 8.1 mg/dL (ref 7.0–26.0)
CALCIUM: 9.8 mg/dL (ref 8.4–10.4)
CHLORIDE: 105 meq/L (ref 98–109)
CO2: 25 mEq/L (ref 22–29)
Creatinine: 0.7 mg/dL (ref 0.6–1.1)
Glucose: 94 mg/dl (ref 70–140)
Potassium: 4.2 mEq/L (ref 3.5–5.1)
Sodium: 142 mEq/L (ref 136–145)
Total Bilirubin: 0.34 mg/dL (ref 0.20–1.20)
Total Protein: 6.9 g/dL (ref 6.4–8.3)

## 2015-03-20 LAB — LACTATE DEHYDROGENASE (CC13): LDH: 182 U/L (ref 125–245)

## 2015-03-20 LAB — URIC ACID (CC13): URIC ACID, SERUM: 6.2 mg/dL (ref 2.6–7.4)

## 2015-03-20 LAB — PREGNANCY, URINE: PREG TEST UR: NEGATIVE

## 2015-03-20 MED ORDER — IOHEXOL 300 MG/ML  SOLN
100.0000 mL | Freq: Once | INTRAMUSCULAR | Status: AC | PRN
  Administered 2015-03-20: 100 mL via INTRAVENOUS

## 2015-03-20 MED ORDER — IOHEXOL 300 MG/ML  SOLN
50.0000 mL | Freq: Once | INTRAMUSCULAR | Status: AC | PRN
  Administered 2015-03-20: 50 mL via ORAL

## 2015-03-23 ENCOUNTER — Other Ambulatory Visit: Payer: Medicaid Other

## 2015-03-25 ENCOUNTER — Telehealth: Payer: Self-pay | Admitting: Hematology and Oncology

## 2015-03-25 ENCOUNTER — Ambulatory Visit (HOSPITAL_BASED_OUTPATIENT_CLINIC_OR_DEPARTMENT_OTHER): Payer: Medicaid Other | Admitting: Hematology and Oncology

## 2015-03-25 ENCOUNTER — Encounter: Payer: Self-pay | Admitting: Hematology and Oncology

## 2015-03-25 VITALS — BP 128/62 | HR 87 | Temp 98.6°F | Resp 18 | Ht 66.0 in | Wt 227.5 lb

## 2015-03-25 DIAGNOSIS — G620 Drug-induced polyneuropathy: Secondary | ICD-10-CM

## 2015-03-25 DIAGNOSIS — R1900 Intra-abdominal and pelvic swelling, mass and lump, unspecified site: Secondary | ICD-10-CM | POA: Diagnosis not present

## 2015-03-25 DIAGNOSIS — N838 Other noninflammatory disorders of ovary, fallopian tube and broad ligament: Secondary | ICD-10-CM | POA: Diagnosis not present

## 2015-03-25 DIAGNOSIS — C8330 Diffuse large B-cell lymphoma, unspecified site: Secondary | ICD-10-CM | POA: Diagnosis present

## 2015-03-25 DIAGNOSIS — T451X5A Adverse effect of antineoplastic and immunosuppressive drugs, initial encounter: Principal | ICD-10-CM

## 2015-03-25 MED ORDER — GABAPENTIN 300 MG PO CAPS: 300.0000 mg | ORAL_CAPSULE | Freq: Three times a day (TID) | ORAL | Status: DC

## 2015-03-25 NOTE — Telephone Encounter (Signed)
Gave and printed appt sched and avs fo rpt for May and Aug..pt sched with Dr. Fermin Schwab on 5.16 @ 1:45pm.

## 2015-03-26 NOTE — Progress Notes (Signed)
[facility] OFFICE PROGRESS NOTE  Patient Care Team: Pcp Not In System as PCP - General  SUMMARY OF ONCOLOGIC HISTORY: Oncology History   Diffuse large B cell lymphoma   Primary site: Lymphoid Neoplasms   Staging method: AJCC 6th Edition   Clinical: Stage IV signed by Heath Lark, MD on 09/03/2014  8:18 PM   Summary: Stage IV IPI of 3: high LDH, >2 extranodal disease and stage IV at presentation       Diffuse large B cell lymphoma   07/30/2014 Imaging She had a bilateral mammogram which came back abnormal. Ultrasound-guided biopsy was performed, suspicious for lymphoma   08/25/2014 Imaging PET/CT scan showed diffuse metastatic cancer involving bilateral breasts, bone, and large abdominal mass   08/28/2014 Surgery She had excisional surgery and removal of right breast mass   08/28/2014 Pathology Results Accession: [id number] right breast mass is consistent with diffuse large B-cell lymphoma with high proliferation index.   09/04/2014 - 12/26/2014 Hospital Admission she ad 6 cycles of rituximab-EPOCH plus 4 cycles of CNS prophylaxis with IT MTX   09/04/2014 Imaging echocardiogram is normal.   09/04/2014 Procedure she has placement of PICC line.   10/14/2014 Imaging PET/CT scan showed near complete response to treatment.   12/17/2014 Imaging Repeat PET CT scan showed near complete response to treatment.   03/20/2015 Imaging CT chest, abdomen and pelvis showed no residual lymphoma but presence of bilateral enlarged ovaries.    INTERVAL HISTORY: Please see below for problem oriented charting. She returns for further follow-up. She denies new lymphadenopathy. She has no menstruation since recent chemotherapy.  REVIEW OF SYSTEMS:   Constitutional: Denies fevers, chills or abnormal weight loss Eyes: Denies blurriness of vision Ears, nose, mouth, throat, and face: Denies mucositis or sore throat Respiratory: Denies cough, dyspnea or wheezes Cardiovascular: Denies palpitation,  chest discomfort or lower extremity swelling Gastrointestinal:  Denies nausea, heartburn or change in bowel habits Skin: Denies abnormal skin rashes Lymphatics: Denies new lymphadenopathy or easy bruising Neurological:Denies numbness, tingling or new weaknesses Behavioral/Psych: Mood is stable, no new changes  All other systems were reviewed with the patient and are negative.  I have reviewed the past medical history, past surgical history, social history and family history with the patient and they are unchanged from previous note.  ALLERGIES:  is allergic to other and soap.  MEDICATIONS:  Current Outpatient Prescriptions  Medication Sig Dispense Refill  . cyclobenzaprine (FLEXERIL) 10 MG tablet Take 1 tablet (10 mg total) by mouth 2 (two) times daily as needed for muscle spasms. (Patient not taking: Reported on 03/25/2015) 20 tablet 0  . gabapentin (NEURONTIN) 300 MG capsule Take 1 capsule (300 mg total) by mouth 3 (three) times daily. 90 capsule 0   No current facility-administered medications for this visit.    PHYSICAL EXAMINATION: ECOG PERFORMANCE STATUS: 0 - Asymptomatic  Filed Vitals:   03/25/15 1043  BP: 128/62  Pulse: 87  Temp: 98.6 F (37 C)  Resp: 18   Filed Weights   03/25/15 1043  Weight: 227 lb 8 oz (103.193 kg)    GENERAL:alert, no distress and comfortable SKIN: skin color, texture, turgor are normal, no rashes or significant lesions EYES: normal, Conjunctiva are pink and non-injected, sclera clear OROPHARYNX:no exudate, no erythema and lips, buccal mucosa, and tongue normal  NECK: supple, thyroid normal size, non-tender, without nodularity LYMPH:  no palpable lymphadenopathy in the cervical, axillary or inguinal LUNGS: clear to auscultation and percussion with normal breathing effort HEART: regular rate &  rhythm and no murmurs and no lower extremity edema ABDOMEN:abdomen soft, non-tender and normal bowel sounds Musculoskeletal:no cyanosis of digits and no  clubbing  NEURO: alert & oriented x 3 with fluent speech, no focal motor/sensory deficits  LABORATORY DATA:  I have reviewed the data as listed    Component Value Date/Time   NA 142 03/20/2015 0839   NA 133* 12/26/2014 0530   K 4.2 03/20/2015 0839   K 3.5 12/26/2014 0530   CL 100 12/26/2014 0530   CO2 25 03/20/2015 0839   CO2 27 12/26/2014 0530   GLUCOSE 94 03/20/2015 0839   GLUCOSE 101* 12/26/2014 0530   BUN 8.1 03/20/2015 0839   BUN 12 12/26/2014 0530   CREATININE 0.7 03/20/2015 0839   CREATININE 0.52 12/26/2014 0530   CALCIUM 9.8 03/20/2015 0839   CALCIUM 9.2 12/26/2014 0530   PROT 6.9 03/20/2015 0839   PROT 6.7 12/26/2014 0530   ALBUMIN 4.0 03/20/2015 0839   ALBUMIN 3.9 12/26/2014 0530   AST 10 03/20/2015 0839   AST 13 12/26/2014 0530   ALT 9 03/20/2015 0839   ALT 15 12/26/2014 0530   ALKPHOS 115 03/20/2015 0839   ALKPHOS 71 12/26/2014 0530   BILITOT 0.34 03/20/2015 0839   BILITOT 0.7 12/26/2014 0530   GFRNONAA >90 12/26/2014 0530   GFRAA >90 12/26/2014 0530    No results found for: SPEP, UPEP  Lab Results  Component Value Date   WBC 6.2 03/20/2015   NEUTROABS 4.5 03/20/2015   HGB 13.6 03/20/2015   HCT 41.0 03/20/2015   MCV 85.1 03/20/2015   PLT 353 03/20/2015      Chemistry      Component Value Date/Time   NA 142 03/20/2015 0839   NA 133* 12/26/2014 0530   K 4.2 03/20/2015 0839   K 3.5 12/26/2014 0530   CL 100 12/26/2014 0530   CO2 25 03/20/2015 0839   CO2 27 12/26/2014 0530   BUN 8.1 03/20/2015 0839   BUN 12 12/26/2014 0530   CREATININE 0.7 03/20/2015 0839   CREATININE 0.52 12/26/2014 0530      Component Value Date/Time   CALCIUM 9.8 03/20/2015 0839   CALCIUM 9.2 12/26/2014 0530   ALKPHOS 115 03/20/2015 0839   ALKPHOS 71 12/26/2014 0530   AST 10 03/20/2015 0839   AST 13 12/26/2014 0530   ALT 9 03/20/2015 0839   ALT 15 12/26/2014 0530   BILITOT 0.34 03/20/2015 0839   BILITOT 0.7 12/26/2014 0530       RADIOGRAPHIC STUDIES: I reviewed  her most recent CT scan with the patient I have personally reviewed the radiological images as listed and agreed with the findings in the report.   ASSESSMENT & PLAN:  Diffuse large B cell lymphoma Clinically, she has no evidence of active disease. There were incidental finding of bilateral enlarged ovaries. I will refer her to GYN oncologist for further workup. From the lymphoma standpoint, I will see her back in with history, physical examination and blood work in 3 months and repeat imaging in 6 months.   Neuropathy due to chemotherapeutic drug She is symptomatic. I recommend a trial of low-dose gabapentin. I warned her about potential side effects of sedation, nausea and constipation.   Pelvic mass in female The patient was noted to have pelvic mass at the initial diagnosis. The uterine mass has shrunk but she have persistent enlarged bilateral ovaries. I recommend ultrasound for further evaluation and referral to gynecologist oncologist to exclude malignancy. The patient remained amenorrheic since  her recent chemotherapy but she denies hot flashes.    Orders Placed This Encounter  Procedures  . US Pelvis Complete    Standing Status: Future     Number of Occurrences:      Standing Expiration Date: 05/24/2016    Scheduling Instructions:     After 3 pm if possible    Order Specific Question:  Reason for Exam (SYMPTOM  OR DIAGNOSIS REQUIRED)    Answer:  evaluate ovarian mass    Order Specific Question:  Preferred imaging location?    Answer:  [facility]  . Ambulatory referral to Gynecologic Oncology    Referral Priority:  Routine    Referral Type:  Consultation    Referral Reason:  Specialty Services Required    Requested Specialty:  Oncology    Number of Visits Requested:  1   All questions were answered. The patient knows to call the clinic with any problems, questions or concerns. No barriers to learning was detected. I spent 25 minutes counseling the patient  face to face. The total time spent in the appointment was 30 minutes and more than 50% was on counseling and review of test results     [facility], [place], MD 03/26/2015 1:30 PM

## 2015-03-26 NOTE — Assessment & Plan Note (Signed)
The patient was noted to have pelvic mass at the initial diagnosis. The uterine mass has shrunk but she have persistent enlarged bilateral ovaries. I recommend ultrasound for further evaluation and referral to gynecologist oncologist to exclude malignancy. The patient remained amenorrheic since her recent chemotherapy but she denies hot flashes.

## 2015-03-26 NOTE — Assessment & Plan Note (Signed)
She is symptomatic. I recommend a trial of low-dose gabapentin. I warned her about potential side effects of sedation, nausea and constipation.

## 2015-03-26 NOTE — Assessment & Plan Note (Signed)
Clinically, she has no evidence of active disease. There were incidental finding of bilateral enlarged ovaries. I will refer her to GYN oncologist for further workup. From the lymphoma standpoint, I will see her back in with history, physical examination and blood work in 3 months and repeat imaging in 6 months.

## 2015-03-30 ENCOUNTER — Encounter: Payer: Self-pay | Admitting: Gynecology

## 2015-03-30 ENCOUNTER — Ambulatory Visit: Payer: Medicaid Other | Attending: Gynecology | Admitting: Gynecology

## 2015-03-30 VITALS — BP 135/85 | HR 98 | Temp 98.5°F | Resp 18 | Ht 66.0 in | Wt 224.4 lb

## 2015-03-30 DIAGNOSIS — N838 Other noninflammatory disorders of ovary, fallopian tube and broad ligament: Secondary | ICD-10-CM | POA: Insufficient documentation

## 2015-03-30 DIAGNOSIS — E669 Obesity, unspecified: Secondary | ICD-10-CM | POA: Diagnosis not present

## 2015-03-30 DIAGNOSIS — Z8572 Personal history of non-Hodgkin lymphomas: Secondary | ICD-10-CM | POA: Diagnosis not present

## 2015-03-30 NOTE — Patient Instructions (Signed)
Followup with be dependant upon ultrasound results scheduled this Wednesday. Please call us sooner with any questions or concerns you have.

## 2015-03-30 NOTE — Progress Notes (Signed)
Consult Note: Gyn-Onc   Lindsey Irwin 19 y.o. female  Chief Complaint  Patient presents with  . enlarged ovaries    New patient    Assessment :Bilaterally enlarged ovaries most likely secondary to polycystic ovarian syndrome. Obesity  Plan:an ultrasound has previously been scheduled for next Wednesday. We will await those results and contact the patient and her mother regarding these findings.   HPI: 25 year old Afro-American female seen in consultation at the request of Dr.Gorsuch regarding the findings of bilaterally enlarged ovaries on CT scan obtained 03/20/2015 in follow-up following completion of chemotherapy for a diffuse large B-cell lymphoma.the ovaries were measured as 5.5 x 3.9 cm on the left and 5.0 x 3.8 cm on the right. It is noted that the ovaries were previously recognized as enlarged on prior PET scans although no measurements were recorded on those reports. She comes accompanied by her mother.  From a gynecologic point of view the patient reports she has had regular cyclic menses. Well she has been on chemotherapy she received Lupron. She did have some expected hot flushes but otherwise has done well with Lupron. She notes she occasionally has had midcycle pain. Otherwise she denies any other gynecologic symptoms. She has not had any periods during chemotherapy secondary to Lupron.  There is no family history of ovarian breast or other gynecologic cancers.  Review of Systems:10 point review of systems is negative except as noted in interval history.   Vitals: Blood pressure 135/85, pulse 98, temperature 98.5 F (36.9 C), temperature source Oral, resp. rate 18, height 5\' 6"  (1.676 m), weight 224 lb 6.4 oz (101.787 kg), last menstrual period 08/19/2014.  Physical Exam: General : The patient is a healthy woman in no acute distress.       Allergies  Allergen Reactions  . Other     NO BLOOD PRODUCTS. PT IS A JEHOVAH WITNESS.  Marland Kitchen Soap Itching    GAIN LAUNDRY  DETERGENT Allergic to tide and surf also    Past Medical History  Diagnosis Date  . Breast mass 08/2014  . Sore throat 08/25/2014  . Cough 08/2014  . Nasal congestion 08/2014  . Insomnia 09/24/2014  . Diffuse large B cell lymphoma 09/02/2014    Past Surgical History  Procedure Laterality Date  . No past surgeries    . Breast biopsy Right 08/28/2014    Procedure: RIGHT EXCISIONAL BREAST BIOPSY;  Surgeon: Donnie Mesa, MD;  Location: [facility];  Service: General;  Laterality: Right;    Current Outpatient Prescriptions  Medication Sig Dispense Refill  . gabapentin (NEURONTIN) 300 MG capsule Take 1 capsule (300 mg total) by mouth 3 (three) times daily. 90 capsule 0  . cyclobenzaprine (FLEXERIL) 10 MG tablet Take 1 tablet (10 mg total) by mouth 2 (two) times daily as needed for muscle spasms. (Patient not taking: Reported on 03/25/2015) 20 tablet 0   No current facility-administered medications for this visit.    History   Social History  . Marital Status: Single    Spouse Name: N/A  . Number of Children: N/A  . Years of Education: N/A   Occupational History  . Not on file.   Social History Main Topics  . Smoking status: Never Smoker   . Smokeless tobacco: Never Used  . Alcohol Use: No  . Drug Use: No  . Sexual Activity: No   Other Topics Concern  . Not on file   Social History Narrative    History reviewed. No pertinent family history.  Alvino Chapel, MD 03/30/2015, 2:19 PM       Consult Note: Gyn-Onc   Lindsey Irwin 19 y.o. female  Chief Complaint  Patient presents with  . enlarged ovaries    New patient    Assessment :  Plan:  Interval History:   HPI:  Review of Systems:10 point review of systems is negative except as noted in interval history.   Vitals: Blood pressure 135/85, pulse 98, temperature 98.5 F (36.9 C), temperature source Oral, resp. rate 18, height 5\' 6"  (1.676 m), weight 224 lb 6.4 oz (101.787  kg), last menstrual period 08/19/2014.  Physical Exam: General : The patient is a healthy woman in no acute distress.  HEENT: normocephalic, extraoccular movements normal; neck is supple without thyromegally  Lynphnodes: Supraclavicular and inguinal nodes not enlarged  Abdomen: Soft, non-tender, no ascites, no organomegally, no masses, no hernias  Pelvic:  EGBUS: Normal female  Vagina: Normal, no lesions  Urethra and Bladder: Normal, non-tender  Cervix: Surgically absent  Uterus: Surgically absent  Bi-manual examination: Non-tender; no adenxal masses or nodularity  Rectal: normal sphincter tone, no masses, no blood  Lower extremities: No edema or varicosities. Normal range of motion      Allergies  Allergen Reactions  . Other     NO BLOOD PRODUCTS. PT IS A JEHOVAH WITNESS.  Marland Kitchen Soap Itching    GAIN LAUNDRY DETERGENT Allergic to tide and surf also    Past Medical History  Diagnosis Date  . Breast mass 08/2014  . Sore throat 08/25/2014  . Cough 08/2014  . Nasal congestion 08/2014  . Insomnia 09/24/2014  . Diffuse large B cell lymphoma 09/02/2014    Past Surgical History  Procedure Laterality Date  . No past surgeries    . Breast biopsy Right 08/28/2014    Procedure: RIGHT EXCISIONAL BREAST BIOPSY;  Surgeon: Donnie Mesa, MD;  Location: [facility];  Service: General;  Laterality: Right;    Current Outpatient Prescriptions  Medication Sig Dispense Refill  . gabapentin (NEURONTIN) 300 MG capsule Take 1 capsule (300 mg total) by mouth 3 (three) times daily. 90 capsule 0  . cyclobenzaprine (FLEXERIL) 10 MG tablet Take 1 tablet (10 mg total) by mouth 2 (two) times daily as needed for muscle spasms. (Patient not taking: Reported on 03/25/2015) 20 tablet 0   No current facility-administered medications for this visit.    History   Social History  . Marital Status: Single    Spouse Name: N/A  . Number of Children: N/A  . Years of Education: N/A    Occupational History  . Not on file.   Social History Main Topics  . Smoking status: Never Smoker   . Smokeless tobacco: Never Used  . Alcohol Use: No  . Drug Use: No  . Sexual Activity: No   Other Topics Concern  . Not on file   Social History Narrative    History reviewed. No pertinent family history.    Alvino Chapel, MD 03/30/2015, 2:19 PM

## 2015-04-01 ENCOUNTER — Ambulatory Visit (HOSPITAL_COMMUNITY)
Admission: RE | Admit: 2015-04-01 | Discharge: 2015-04-01 | Disposition: A | Discharge status: Home / Self Care | Payer: Medicaid Other | Source: Ambulatory Visit | Attending: Hematology and Oncology | Admitting: Hematology and Oncology

## 2015-04-01 DIAGNOSIS — N8320 Unspecified ovarian cysts: Secondary | ICD-10-CM | POA: Insufficient documentation

## 2015-04-01 DIAGNOSIS — R1900 Intra-abdominal and pelvic swelling, mass and lump, unspecified site: Secondary | ICD-10-CM

## 2015-04-06 ENCOUNTER — Telehealth: Payer: Self-pay | Admitting: Nurse Practitioner

## 2015-04-06 DIAGNOSIS — N838 Other noninflammatory disorders of ovary, fallopian tube and broad ligament: Secondary | ICD-10-CM

## 2015-04-06 NOTE — Telephone Encounter (Signed)
Per Joylene John, NP, this RN called patient to inform her pelvic ultrasound shows a cyst and we will repeat ultrasound in 3 months. Apt made 06/26/15 at 9:00 arrive at 8:45 with a full bladder. This apt falls on same day as f/u with Dr. Alvy Bimler. Patient informed to call clinic with any changes or concerns in the meantime. She verbalizes understanding.

## 2015-05-17 ENCOUNTER — Other Ambulatory Visit: Payer: Self-pay | Admitting: Hematology and Oncology

## 2015-05-19 NOTE — Telephone Encounter (Signed)
Received request for refill on gabapentin & called pt to see if it is helping her & if she is taking it.  Message left to call back.

## 2015-05-20 ENCOUNTER — Other Ambulatory Visit: Payer: Self-pay | Admitting: *Deleted

## 2015-05-20 MED ORDER — GABAPENTIN 300 MG PO CAPS: 300.0000 mg | ORAL_CAPSULE | Freq: Three times a day (TID) | ORAL | Status: DC

## 2015-06-22 ENCOUNTER — Telehealth: Payer: Self-pay | Admitting: Hematology and Oncology

## 2015-06-22 NOTE — Telephone Encounter (Signed)
lvm for pt if she would be ok to move appt to 10:30 and 11 per MD request....advised pt to call me back to confirm

## 2015-06-24 ENCOUNTER — Telehealth: Payer: Self-pay | Admitting: Hematology and Oncology

## 2015-06-24 ENCOUNTER — Telehealth: Payer: Self-pay | Admitting: *Deleted

## 2015-06-24 NOTE — Telephone Encounter (Signed)
S/w pt and she can move her appt on Friday 8/12 to come at 10:30 am for lab/ 11 am MD.

## 2015-06-24 NOTE — Telephone Encounter (Signed)
Appointments moved per pof and per pof patient is aware

## 2015-06-26 ENCOUNTER — Other Ambulatory Visit (HOSPITAL_BASED_OUTPATIENT_CLINIC_OR_DEPARTMENT_OTHER): Payer: Medicaid Other

## 2015-06-26 ENCOUNTER — Encounter: Payer: Self-pay | Admitting: Hematology and Oncology

## 2015-06-26 ENCOUNTER — Ambulatory Visit (HOSPITAL_BASED_OUTPATIENT_CLINIC_OR_DEPARTMENT_OTHER): Payer: Medicaid Other | Admitting: Hematology and Oncology

## 2015-06-26 ENCOUNTER — Ambulatory Visit (HOSPITAL_COMMUNITY)
Admission: RE | Admit: 2015-06-26 | Discharge: 2015-06-26 | Disposition: A | Discharge status: Home / Self Care | Payer: Medicaid Other | Source: Ambulatory Visit | Attending: Gynecology | Admitting: Gynecology

## 2015-06-26 ENCOUNTER — Other Ambulatory Visit: Payer: Medicaid Other

## 2015-06-26 ENCOUNTER — Telehealth: Payer: Self-pay | Admitting: Hematology and Oncology

## 2015-06-26 ENCOUNTER — Ambulatory Visit: Payer: Medicaid Other | Admitting: Hematology and Oncology

## 2015-06-26 VITALS — BP 113/59 | HR 67 | Temp 98.2°F | Resp 18 | Ht 66.0 in | Wt 220.9 lb

## 2015-06-26 DIAGNOSIS — C8330 Diffuse large B-cell lymphoma, unspecified site: Secondary | ICD-10-CM

## 2015-06-26 DIAGNOSIS — G620 Drug-induced polyneuropathy: Secondary | ICD-10-CM

## 2015-06-26 DIAGNOSIS — Q5039 Other congenital malformation of ovary: Secondary | ICD-10-CM | POA: Insufficient documentation

## 2015-06-26 DIAGNOSIS — T451X5A Adverse effect of antineoplastic and immunosuppressive drugs, initial encounter: Secondary | ICD-10-CM

## 2015-06-26 DIAGNOSIS — R1900 Intra-abdominal and pelvic swelling, mass and lump, unspecified site: Secondary | ICD-10-CM

## 2015-06-26 DIAGNOSIS — Z8572 Personal history of non-Hodgkin lymphomas: Secondary | ICD-10-CM | POA: Diagnosis not present

## 2015-06-26 DIAGNOSIS — N838 Other noninflammatory disorders of ovary, fallopian tube and broad ligament: Secondary | ICD-10-CM

## 2015-06-26 LAB — CBC WITH DIFFERENTIAL/PLATELET
BASO%: 0.6 % (ref 0.0–2.0)
BASOS ABS: 0 10*3/uL (ref 0.0–0.1)
EOS ABS: 0.1 10*3/uL (ref 0.0–0.5)
EOS%: 1.8 % (ref 0.0–7.0)
HEMATOCRIT: 40.9 % (ref 34.8–46.6)
HGB: 13.9 g/dL (ref 11.6–15.9)
LYMPH#: 1 10*3/uL (ref 0.9–3.3)
LYMPH%: 17.3 % (ref 14.0–49.7)
MCH: 28.7 pg (ref 25.1–34.0)
MCHC: 33.9 g/dL (ref 31.5–36.0)
MCV: 84.6 fL (ref 79.5–101.0)
MONO#: 0.3 10*3/uL (ref 0.1–0.9)
MONO%: 4.8 % (ref 0.0–14.0)
NEUT%: 75.5 % (ref 38.4–76.8)
NEUTROS ABS: 4.2 10*3/uL (ref 1.5–6.5)
Platelets: 337 10*3/uL (ref 145–400)
RBC: 4.83 10*6/uL (ref 3.70–5.45)
RDW: 14.5 % (ref 11.2–14.5)
WBC: 5.5 10*3/uL (ref 3.9–10.3)

## 2015-06-26 LAB — COMPREHENSIVE METABOLIC PANEL (CC13)
ALK PHOS: 95 U/L (ref 40–150)
ALT: 9 U/L (ref 0–55)
AST: 12 U/L (ref 5–34)
Albumin: 4.1 g/dL (ref 3.5–5.0)
Anion Gap: 9 mEq/L (ref 3–11)
BILIRUBIN TOTAL: 0.43 mg/dL (ref 0.20–1.20)
BUN: 11.2 mg/dL (ref 7.0–26.0)
CO2: 25 mEq/L (ref 22–29)
Calcium: 9.6 mg/dL (ref 8.4–10.4)
Chloride: 107 mEq/L (ref 98–109)
Creatinine: 0.7 mg/dL (ref 0.6–1.1)
EGFR: >90 mL/min/{1.73_m2} (ref >90)
GLUCOSE: 81 mg/dL (ref 70–140)
Potassium: 4.4 mEq/L (ref 3.5–5.1)
Sodium: 141 mEq/L (ref 136–145)
Total Protein: 6.8 g/dL (ref 6.4–8.3)

## 2015-06-26 LAB — LACTATE DEHYDROGENASE (CC13): LDH: 165 U/L (ref 125–245)

## 2015-06-26 LAB — URIC ACID (CC13): Uric Acid, Serum: 5.8 mg/dl (ref 2.6–7.4)

## 2015-06-26 NOTE — Assessment & Plan Note (Signed)
The patient was noted to have pelvic mass at the initial diagnosis. The uterine mass has shrunk but she have persistent enlarged bilateral ovaries. Ultrasound show regression in the size and the patient have return of menses. I recommend observation only

## 2015-06-26 NOTE — Telephone Encounter (Signed)
Confirmed appointment for December.

## 2015-06-26 NOTE — Assessment & Plan Note (Signed)
Clinically, she has no evidence of active disease. There were incidental finding of bilateral enlarged ovaries. She was seen by GYN oncologist and has serial pelvic ultrasound with improvement From the lymphoma standpoint, I will see her back in with history, physical examination and blood work in 4 months.

## 2015-06-26 NOTE — Progress Notes (Signed)
[facility] OFFICE PROGRESS NOTE  Patient Care Team: Pcp Not In System as PCP - General  SUMMARY OF ONCOLOGIC HISTORY: Oncology History   Diffuse large B cell lymphoma   Primary site: Lymphoid Neoplasms   Staging method: AJCC 6th Edition   Clinical: Stage IV signed by Heath Lark, MD on 09/03/2014  8:18 PM   Summary: Stage IV IPI of 3: high LDH, >2 extranodal disease and stage IV at presentation       Diffuse large B cell lymphoma   07/30/2014 Imaging She had a bilateral mammogram which came back abnormal. Ultrasound-guided biopsy was performed, suspicious for lymphoma   08/25/2014 Imaging PET/CT scan showed diffuse metastatic cancer involving bilateral breasts, bone, and large abdominal mass   08/28/2014 Surgery She had excisional surgery and removal of right breast mass   08/28/2014 Pathology Results Accession: [id number] right breast mass is consistent with diffuse large B-cell lymphoma with high proliferation index.   09/04/2014 - 12/26/2014 Hospital Admission she ad 6 cycles of rituximab-EPOCH plus 4 cycles of CNS prophylaxis with IT MTX   09/04/2014 Imaging echocardiogram is normal.   09/04/2014 Procedure she has placement of PICC line.   10/14/2014 Imaging PET/CT scan showed near complete response to treatment.   12/17/2014 Imaging Repeat PET CT scan showed near complete response to treatment.   03/20/2015 Imaging CT chest, abdomen and pelvis showed no residual lymphoma but presence of bilateral enlarged ovaries.    INTERVAL HISTORY: Please see below for problem oriented charting. She returns for further follow-up. She is doing well and had return of menses recently. She have persistent residual neuropathy but it does not bother her. She is working full-time  REVIEW OF SYSTEMS:   Constitutional: Denies fevers, chills or abnormal weight loss Eyes: Denies blurriness of vision Ears, nose, mouth, throat, and face: Denies mucositis or sore throat Respiratory: Denies  cough, dyspnea or wheezes Cardiovascular: Denies palpitation, chest discomfort or lower extremity swelling Gastrointestinal:  Denies nausea, heartburn or change in bowel habits Skin: Denies abnormal skin rashes Lymphatics: Denies new lymphadenopathy or easy bruising Neurological:Denies numbness, tingling or new weaknesses Behavioral/Psych: Mood is stable, no new changes  All other systems were reviewed with the patient and are negative.  I have reviewed the past medical history, past surgical history, social history and family history with the patient and they are unchanged from previous note.  ALLERGIES:  is allergic to other and soap.  MEDICATIONS:  Current Outpatient Prescriptions  Medication Sig Dispense Refill  . gabapentin (NEURONTIN) 300 MG capsule Take 1 capsule (300 mg total) by mouth 3 (three) times daily. 90 capsule 0   No current facility-administered medications for this visit.    PHYSICAL EXAMINATION: ECOG PERFORMANCE STATUS: 0 - Asymptomatic  Filed Vitals:   06/26/15 1037  BP: 113/59  Pulse: 67  Temp: 98.2 F (36.8 C)  Resp: 18   Filed Weights   06/26/15 1037  Weight: 220 lb 14.4 oz (100.2 kg)    GENERAL:alert, no distress and comfortable SKIN: skin color, texture, turgor are normal, no rashes or significant lesions EYES: normal, Conjunctiva are pink and non-injected, sclera clear OROPHARYNX:no exudate, no erythema and lips, buccal mucosa, and tongue normal  NECK: supple, thyroid normal size, non-tender, without nodularity LYMPH:  no palpable lymphadenopathy in the cervical, axillary or inguinal LUNGS: clear to auscultation and percussion with normal breathing effort HEART: regular rate & rhythm and no murmurs and no lower extremity edema ABDOMEN:abdomen soft, non-tender and normal bowel sounds Musculoskeletal:no cyanosis  of digits and no clubbing  NEURO: alert & oriented x 3 with fluent speech, no focal motor/sensory deficits  LABORATORY DATA:  I have  reviewed the data as listed    Component Value Date/Time   NA 141 06/26/2015 1028   NA 133* 12/26/2014 0530   K 4.4 06/26/2015 1028   K 3.5 12/26/2014 0530   CL 100 12/26/2014 0530   CO2 25 06/26/2015 1028   CO2 27 12/26/2014 0530   GLUCOSE 81 06/26/2015 1028   GLUCOSE 101* 12/26/2014 0530   BUN 11.2 06/26/2015 1028   BUN 12 12/26/2014 0530   CREATININE 0.7 06/26/2015 1028   CREATININE 0.52 12/26/2014 0530   CALCIUM 9.6 06/26/2015 1028   CALCIUM 9.2 12/26/2014 0530   PROT 6.8 06/26/2015 1028   PROT 6.7 12/26/2014 0530   ALBUMIN 4.1 06/26/2015 1028   ALBUMIN 3.9 12/26/2014 0530   AST 12 06/26/2015 1028   AST 13 12/26/2014 0530   ALT 9 06/26/2015 1028   ALT 15 12/26/2014 0530   ALKPHOS 95 06/26/2015 1028   ALKPHOS 71 12/26/2014 0530   BILITOT 0.43 06/26/2015 1028   BILITOT 0.7 12/26/2014 0530   GFRNONAA >90 12/26/2014 0530   GFRAA >90 12/26/2014 0530    No results found for: SPEP, UPEP  Lab Results  Component Value Date   WBC 5.5 06/26/2015   NEUTROABS 4.2 06/26/2015   HGB 13.9 06/26/2015   HCT 40.9 06/26/2015   MCV 84.6 06/26/2015   PLT 337 06/26/2015      Chemistry      Component Value Date/Time   NA 141 06/26/2015 1028   NA 133* 12/26/2014 0530   K 4.4 06/26/2015 1028   K 3.5 12/26/2014 0530   CL 100 12/26/2014 0530   CO2 25 06/26/2015 1028   CO2 27 12/26/2014 0530   BUN 11.2 06/26/2015 1028   BUN 12 12/26/2014 0530   CREATININE 0.7 06/26/2015 1028   CREATININE 0.52 12/26/2014 0530      Component Value Date/Time   CALCIUM 9.6 06/26/2015 1028   CALCIUM 9.2 12/26/2014 0530   ALKPHOS 95 06/26/2015 1028   ALKPHOS 71 12/26/2014 0530   AST 12 06/26/2015 1028   AST 13 12/26/2014 0530   ALT 9 06/26/2015 1028   ALT 15 12/26/2014 0530   BILITOT 0.43 06/26/2015 1028   BILITOT 0.7 12/26/2014 0530       RADIOGRAPHIC STUDIES: I have personally reviewed the radiological images as listed and agreed with the findings in the report. US Pelvis  Complete  06/26/2015   CLINICAL DATA:  Subsequent encounter for bilateral ovarian enlargement. Patient with history of lymphoma and PET-CT on 12/17/2014 revealed ovarian hypermetabolism.  EXAM: TRANSABDOMINAL ULTRASOUND OF PELVIS  TECHNIQUE: Transabdominal ultrasound examination of the pelvis was performed including evaluation of the uterus, ovaries, adnexal regions, and pelvic cul-de-sac.  COMPARISON:  04/01/2015.  FINDINGS: Uterus  Measurements: 8.3 x 2.0 x 3.3 cm. No fibroids or other mass visualized.  Endometrium  Thickness: 3 mm.  No focal abnormality visualized.  Right ovary  Measurements: 3.3 x 2.3 x 3.1 cm. Normal appearance/no adnexal mass.  Left ovary  Measurements: 5.0 x 2.7 x 4.6 cm. The 2.1 cm peripheral hypoechoic lesion with diffuse low level internal echoes is again noted. The enhanced through transmission seen in this lesion on the previous study persists but is slightly less prominent on today's study, likely secondary to technical factors.  Other findings:  No free fluid  IMPRESSION: 1. Interval decrease in size of  right ovary which is now normal. No evidence for right ovarian lesion. 2. Persistent slight prominence of the left ovary with stable appearance of a 21 mm complex probably cystic lesion in the 3 month interval since the prior study.   Electronically Signed   By: Misty Stanley M.D.   On: 06/26/2015 09:57     ASSESSMENT & PLAN:  Diffuse large B cell lymphoma Clinically, she has no evidence of active disease. There were incidental finding of bilateral enlarged ovaries. She was seen by GYN oncologist and has serial pelvic ultrasound with improvement From the lymphoma standpoint, I will see her back in with history, physical examination and blood work in 4 months.   Neuropathy due to chemotherapeutic drug She is symptomatic. She is doing well on gabapentin  Pelvic mass in female The patient was noted to have pelvic mass at the initial diagnosis. The uterine mass has shrunk  but she have persistent enlarged bilateral ovaries. Ultrasound show regression in the size and the patient have return of menses. I recommend observation only    No orders of the defined types were placed in this encounter.   All questions were answered. The patient knows to call the clinic with any problems, questions or concerns. No barriers to learning was detected. I spent 15 minutes counseling the patient face to face. The total time spent in the appointment was 20 minutes and more than 50% was on counseling and review of test results     [facility], [place], MD 06/26/2015 12:10 PM

## 2015-06-26 NOTE — Assessment & Plan Note (Signed)
She is symptomatic. She is doing well on gabapentin

## 2015-07-04 ENCOUNTER — Other Ambulatory Visit: Payer: Self-pay | Admitting: Hematology and Oncology

## 2015-07-06 ENCOUNTER — Other Ambulatory Visit: Payer: Self-pay | Admitting: *Deleted

## 2015-07-06 MED ORDER — GABAPENTIN 300 MG PO CAPS: 300.0000 mg | ORAL_CAPSULE | Freq: Three times a day (TID) | ORAL | Status: DC

## 2015-09-11 ENCOUNTER — Other Ambulatory Visit: Payer: Self-pay | Admitting: Hematology and Oncology

## 2015-09-14 MED ORDER — GABAPENTIN 300 MG PO CAPS: 300.0000 mg | ORAL_CAPSULE | Freq: Three times a day (TID) | ORAL | Status: DC

## 2015-09-14 NOTE — Addendum Note (Signed)
Addended by: Sharlynn Oliphant A on: 09/14/2015 02:04 PM   Modules accepted: Orders

## 2015-10-26 ENCOUNTER — Other Ambulatory Visit: Payer: Self-pay | Admitting: Hematology and Oncology

## 2015-10-26 MED ORDER — GABAPENTIN 300 MG PO CAPS: 300.0000 mg | ORAL_CAPSULE | Freq: Three times a day (TID) | ORAL | Status: DC

## 2015-10-30 ENCOUNTER — Telehealth: Payer: Self-pay | Admitting: Hematology and Oncology

## 2015-10-30 ENCOUNTER — Encounter: Payer: Self-pay | Admitting: Hematology and Oncology

## 2015-10-30 ENCOUNTER — Ambulatory Visit (HOSPITAL_BASED_OUTPATIENT_CLINIC_OR_DEPARTMENT_OTHER): Payer: PRIVATE HEALTH INSURANCE | Admitting: Hematology and Oncology

## 2015-10-30 ENCOUNTER — Other Ambulatory Visit (HOSPITAL_BASED_OUTPATIENT_CLINIC_OR_DEPARTMENT_OTHER): Payer: Self-pay

## 2015-10-30 VITALS — BP 134/56 | HR 72 | Temp 98.2°F | Resp 18 | Ht 66.0 in | Wt 224.3 lb

## 2015-10-30 DIAGNOSIS — G620 Drug-induced polyneuropathy: Secondary | ICD-10-CM | POA: Diagnosis not present

## 2015-10-30 DIAGNOSIS — T451X5A Adverse effect of antineoplastic and immunosuppressive drugs, initial encounter: Secondary | ICD-10-CM

## 2015-10-30 DIAGNOSIS — C8330 Diffuse large B-cell lymphoma, unspecified site: Secondary | ICD-10-CM

## 2015-10-30 DIAGNOSIS — Z8572 Personal history of non-Hodgkin lymphomas: Secondary | ICD-10-CM | POA: Diagnosis not present

## 2015-10-30 LAB — CBC WITH DIFFERENTIAL/PLATELET
BASO%: 0.5 % (ref 0.0–2.0)
Basophils Absolute: 0 10*3/uL (ref 0.0–0.1)
EOS ABS: 0.1 10*3/uL (ref 0.0–0.5)
EOS%: 1 % (ref 0.0–7.0)
HEMATOCRIT: 42 % (ref 34.8–46.6)
HEMOGLOBIN: 13.9 g/dL (ref 11.6–15.9)
LYMPH#: 1.7 10*3/uL (ref 0.9–3.3)
LYMPH%: 19.5 % (ref 14.0–49.7)
MCH: 29 pg (ref 25.1–34.0)
MCHC: 33 g/dL (ref 31.5–36.0)
MCV: 87.7 fL (ref 79.5–101.0)
MONO#: 0.3 10*3/uL (ref 0.1–0.9)
MONO%: 3.3 % (ref 0.0–14.0)
NEUT%: 75.7 % (ref 38.4–76.8)
NEUTROS ABS: 6.5 10*3/uL (ref 1.5–6.5)
Platelets: 310 10*3/uL (ref 145–400)
RBC: 4.79 10*6/uL (ref 3.70–5.45)
RDW: 14.2 % (ref 11.2–14.5)
WBC: 8.5 10*3/uL (ref 3.9–10.3)

## 2015-10-30 LAB — COMPREHENSIVE METABOLIC PANEL
ALT: <9 U/L (ref 0–55)
AST: 10 U/L (ref 5–34)
Albumin: 3.9 g/dL (ref 3.5–5.0)
Alkaline Phosphatase: 84 U/L (ref 40–150)
Anion Gap: 9 mEq/L (ref 3–11)
BUN: 11 mg/dL (ref 7.0–26.0)
CO2: 24 mEq/L (ref 22–29)
Calcium: 9.3 mg/dL (ref 8.4–10.4)
Chloride: 108 mEq/L (ref 98–109)
Creatinine: 0.8 mg/dL (ref 0.6–1.1)
EGFR: >90 mL/min/{1.73_m2} (ref >90)
GLUCOSE: 91 mg/dL (ref 70–140)
POTASSIUM: 4.2 meq/L (ref 3.5–5.1)
SODIUM: 141 meq/L (ref 136–145)
Total Bilirubin: 0.3 mg/dL (ref 0.20–1.20)
Total Protein: 6.7 g/dL (ref 6.4–8.3)

## 2015-10-30 LAB — LACTATE DEHYDROGENASE: LDH: 165 U/L (ref 125–245)

## 2015-10-30 NOTE — Assessment & Plan Note (Signed)
She thought that the neuropathy is a little worse. She will continue gabapentin. I told her it is a little was probably because of the cold whether. Continue the same treatment for now

## 2015-10-30 NOTE — Telephone Encounter (Signed)
s.w. pt and advised on Jan appt.....pt ok and aware °

## 2015-10-30 NOTE — Progress Notes (Signed)
[facility] OFFICE PROGRESS NOTE  Patient Care Team: Pcp Not In System as PCP - General  SUMMARY OF ONCOLOGIC HISTORY: Oncology History   Diffuse large B cell lymphoma   Primary site: Lymphoid Neoplasms   Staging method: AJCC 6th Edition   Clinical: Stage IV signed by Heath Lark, MD on 09/03/2014  8:18 PM   Summary: Stage IV IPI of 3: high LDH, >2 extranodal disease and stage IV at presentation       History of B-cell lymphoma   07/30/2014 Imaging She had a bilateral mammogram which came back abnormal. Ultrasound-guided biopsy was performed, suspicious for lymphoma   08/25/2014 Imaging PET/CT scan showed diffuse metastatic cancer involving bilateral breasts, bone, and large abdominal mass   08/28/2014 Surgery She had excisional surgery and removal of right breast mass   08/28/2014 Pathology Results Accession: [id number] right breast mass is consistent with diffuse large B-cell lymphoma with high proliferation index.   09/04/2014 - 12/26/2014 Hospital Admission she ad 6 cycles of rituximab-EPOCH plus 4 cycles of CNS prophylaxis with IT MTX   09/04/2014 Imaging echocardiogram is normal.   09/04/2014 Procedure she has placement of PICC line.   10/14/2014 Imaging PET/CT scan showed near complete response to treatment.   12/17/2014 Imaging Repeat PET CT scan showed near complete response to treatment.   03/20/2015 Imaging CT chest, abdomen and pelvis showed no residual lymphoma but presence of bilateral enlarged ovaries.    INTERVAL HISTORY: Please see below for problem oriented charting.  she presents for further follow-up. She denies new lymphadenopathy about from a palpable skin lump on the right side of the neck.  she denies lymphadenopathy elsewhere.  REVIEW OF SYSTEMS:   Constitutional: Denies fevers, chills or abnormal weight loss Eyes: Denies blurriness of vision Ears, nose, mouth, throat, and face: Denies mucositis or sore throat Respiratory: Denies cough, dyspnea  or wheezes Cardiovascular: Denies palpitation, chest discomfort or lower extremity swelling Gastrointestinal:  Denies nausea, heartburn or change in bowel habits Skin: Denies abnormal skin rashes Lymphatics: Denies new lymphadenopathy or easy bruising Neurological:Denies numbness, tingling or new weaknesses Behavioral/Psych: Mood is stable, no new changes  All other systems were reviewed with the patient and are negative.  I have reviewed the past medical history, past surgical history, social history and family history with the patient and they are unchanged from previous note.  ALLERGIES:  is allergic to other and soap.  MEDICATIONS:  Current Outpatient Prescriptions  Medication Sig Dispense Refill  . buPROPion (WELLBUTRIN XL) 150 MG 24 hr tablet Take 300 mg by mouth daily.    Marland Kitchen gabapentin (NEURONTIN) 300 MG capsule Take 1 capsule (300 mg total) by mouth 3 (three) times daily. 90 capsule 0   No current facility-administered medications for this visit.    PHYSICAL EXAMINATION: ECOG PERFORMANCE STATUS: 0 - Asymptomatic  Filed Vitals:   10/30/15 1256  BP: 134/56  Pulse: 72  Temp: 98.2 F (36.8 C)  Resp: 18   Filed Weights   10/30/15 1256  Weight: 224 lb 4.8 oz (101.742 kg)    GENERAL:alert, no distress and comfortable SKIN: skin color, texture, turgor are normal, no rashes or significant lesions. She has a small palpable subcutaneous nodule that I do not believe is of any significance. It could be a lymph node or just a cyst EYES: normal, Conjunctiva are pink and non-injected, sclera clear OROPHARYNX:no exudate, no erythema and lips, buccal mucosa, and tongue normal  NECK: supple, thyroid normal size, non-tender, without nodularity LYMPH:  no palpable lymphadenopathy in the cervical, axillary or inguinal LUNGS: clear to auscultation and percussion with normal breathing effort HEART: regular rate & rhythm and no murmurs and no lower extremity edema ABDOMEN:abdomen soft,  non-tender and normal bowel sounds Musculoskeletal:no cyanosis of digits and no clubbing  NEURO: alert & oriented x 3 with fluent speech, no focal motor/sensory deficits  LABORATORY DATA:  I have reviewed the data as listed    Component Value Date/Time   NA 141 06/26/2015 1028   NA 133* 12/26/2014 0530   K 4.4 06/26/2015 1028   K 3.5 12/26/2014 0530   CL 100 12/26/2014 0530   CO2 25 06/26/2015 1028   CO2 27 12/26/2014 0530   GLUCOSE 81 06/26/2015 1028   GLUCOSE 101* 12/26/2014 0530   BUN 11.2 06/26/2015 1028   BUN 12 12/26/2014 0530   CREATININE 0.7 06/26/2015 1028   CREATININE 0.52 12/26/2014 0530   CALCIUM 9.6 06/26/2015 1028   CALCIUM 9.2 12/26/2014 0530   PROT 6.8 06/26/2015 1028   PROT 6.7 12/26/2014 0530   ALBUMIN 4.1 06/26/2015 1028   ALBUMIN 3.9 12/26/2014 0530   AST 12 06/26/2015 1028   AST 13 12/26/2014 0530   ALT 9 06/26/2015 1028   ALT 15 12/26/2014 0530   ALKPHOS 95 06/26/2015 1028   ALKPHOS 71 12/26/2014 0530   BILITOT 0.43 06/26/2015 1028   BILITOT 0.7 12/26/2014 0530   GFRNONAA >90 12/26/2014 0530   GFRAA >90 12/26/2014 0530    No results found for: SPEP, UPEP  Lab Results  Component Value Date   WBC 8.5 10/30/2015   NEUTROABS 6.5 10/30/2015   HGB 13.9 10/30/2015   HCT 42.0 10/30/2015   MCV 87.7 10/30/2015   PLT 310 10/30/2015      Chemistry      Component Value Date/Time   NA 141 06/26/2015 1028   NA 133* 12/26/2014 0530   K 4.4 06/26/2015 1028   K 3.5 12/26/2014 0530   CL 100 12/26/2014 0530   CO2 25 06/26/2015 1028   CO2 27 12/26/2014 0530   BUN 11.2 06/26/2015 1028   BUN 12 12/26/2014 0530   CREATININE 0.7 06/26/2015 1028   CREATININE 0.52 12/26/2014 0530      Component Value Date/Time   CALCIUM 9.6 06/26/2015 1028   CALCIUM 9.2 12/26/2014 0530   ALKPHOS 95 06/26/2015 1028   ALKPHOS 71 12/26/2014 0530   AST 12 06/26/2015 1028   AST 13 12/26/2014 0530   ALT 9 06/26/2015 1028   ALT 15 12/26/2014 0530   BILITOT 0.43  06/26/2015 1028   BILITOT 0.7 12/26/2014 0530      ASSESSMENT & PLAN:  History of B-cell lymphoma  Her blood work is normal and she denies symptoms. She is concerned about palpable lymph node on the right side of the neck, behind the sternocleidomastoid muscle. On examination, there is a very small superficial, palpable lymph node measure less than 1 cm in size, rubbery.  I felt that this is likely benign/reactive. I recommend observation only for now and recheck in one month. I will call the patient a week ahead of time and if the lymph node is still palpable by her own exam, I might consider ordering imaging study with CT scan.  Neuropathy due to chemotherapeutic drug  She thought that the neuropathy is a little worse. She will continue gabapentin. I told her it is a little was probably because of the cold whether. Continue the same treatment for now   No  orders of the defined types were placed in this encounter.   All questions were answered. The patient knows to call the clinic with any problems, questions or concerns. No barriers to learning was detected. I spent 15 minutes counseling the patient face to face. The total time spent in the appointment was 20 minutes and more than 50% was on counseling and review of test results     [facility], [place], MD 10/30/2015 1:16 PM

## 2015-10-30 NOTE — Assessment & Plan Note (Signed)
Her blood work is normal and she denies symptoms. She is concerned about palpable lymph node on the right side of the neck, behind the sternocleidomastoid muscle. On examination, there is a very small superficial, palpable lymph node measure less than 1 cm in size, rubbery.  I felt that this is likely benign/reactive. I recommend observation only for now and recheck in one month. I will call the patient a week ahead of time and if the lymph node is still palpable by her own exam, I might consider ordering imaging study with CT scan.

## 2015-10-30 NOTE — Telephone Encounter (Signed)
Gave and pritned appt sched and avs for pt for Jan 2017 °

## 2015-11-11 NOTE — Progress Notes (Unsigned)
Glucose: Non- Fasting: 76 mgl. Per Samule Dry RN

## 2015-11-23 ENCOUNTER — Telehealth: Payer: Self-pay | Admitting: Hematology and Oncology

## 2015-11-23 NOTE — Telephone Encounter (Signed)
I called the patient twice and left HER 2 voicemail regarding follow-up on her palpable lymphadenopathy detected a month ago. Awaiting hear back from her. If she has persistent lymphadenopathy, I will proceed to order imaging study to follow due to history of lymphoma.

## 2015-11-24 ENCOUNTER — Telehealth: Payer: Self-pay | Admitting: *Deleted

## 2015-11-24 NOTE — Telephone Encounter (Signed)
-----   Message from Heath Lark, MD sent at 11/23/2015 12:36 PM EST ----- Regarding: call regarding LN Can you call her tomorrow?  ----- Message -----    From: Heath Lark, MD    Sent: 10/30/2015   1:07 PM      To: Heath Lark, MD Subject: call pt [phone]

## 2015-11-24 NOTE — Telephone Encounter (Signed)
Pt states the lymph node is "the same" she doesn't feel it is any worse or any better.  She does not want to schedule a CT Scan because she says she doesn't think she can afford it at this time.  She has new insurance which she doesn't think is very good.  Encouraged pt to have CT scheduled to get pre cert so she can find out how much it would cost her before she decides.  But pt says she wants to hold off for now.  She wants to cancel appt to see Dr. Alvy Bimler on 1/16 and wants to be scheduled 3 months from her last visit.  She says she will call us if the lymph node swelling gets any worse before then.

## 2015-11-25 ENCOUNTER — Other Ambulatory Visit: Payer: Self-pay | Admitting: Hematology and Oncology

## 2015-11-25 NOTE — Telephone Encounter (Signed)
I cancelled her appt next week and place new POF to 3/16

## 2015-11-29 ENCOUNTER — Telehealth: Payer: Self-pay | Admitting: Hematology and Oncology

## 2015-11-29 NOTE — Telephone Encounter (Signed)
aware of march appointments

## 2015-11-30 ENCOUNTER — Other Ambulatory Visit: Payer: PRIVATE HEALTH INSURANCE

## 2015-11-30 ENCOUNTER — Ambulatory Visit: Payer: PRIVATE HEALTH INSURANCE | Admitting: Hematology and Oncology

## 2015-12-17 ENCOUNTER — Other Ambulatory Visit: Payer: Self-pay | Admitting: Hematology and Oncology

## 2015-12-18 ENCOUNTER — Other Ambulatory Visit: Payer: Self-pay | Admitting: Hematology and Oncology

## 2015-12-18 MED ORDER — GABAPENTIN 300 MG PO CAPS: 300.0000 mg | ORAL_CAPSULE | Freq: Three times a day (TID) | ORAL | Status: DC

## 2016-01-28 ENCOUNTER — Ambulatory Visit (HOSPITAL_BASED_OUTPATIENT_CLINIC_OR_DEPARTMENT_OTHER): Payer: PRIVATE HEALTH INSURANCE | Admitting: Hematology and Oncology

## 2016-01-28 ENCOUNTER — Other Ambulatory Visit (HOSPITAL_BASED_OUTPATIENT_CLINIC_OR_DEPARTMENT_OTHER): Payer: PRIVATE HEALTH INSURANCE

## 2016-01-28 ENCOUNTER — Encounter: Payer: Self-pay | Admitting: Hematology and Oncology

## 2016-01-28 ENCOUNTER — Telehealth: Payer: Self-pay | Admitting: Hematology and Oncology

## 2016-01-28 VITALS — BP 132/68 | HR 75 | Temp 98.5°F | Resp 19 | Wt 245.5 lb

## 2016-01-28 DIAGNOSIS — R1900 Intra-abdominal and pelvic swelling, mass and lump, unspecified site: Secondary | ICD-10-CM | POA: Diagnosis not present

## 2016-01-28 DIAGNOSIS — C8330 Diffuse large B-cell lymphoma, unspecified site: Secondary | ICD-10-CM

## 2016-01-28 DIAGNOSIS — Z8572 Personal history of non-Hodgkin lymphomas: Secondary | ICD-10-CM

## 2016-01-28 LAB — CBC WITH DIFFERENTIAL/PLATELET
BASO%: 0.2 % (ref 0.0–2.0)
BASOS ABS: 0 10*3/uL (ref 0.0–0.1)
EOS ABS: 0.1 10*3/uL (ref 0.0–0.5)
EOS%: 1 % (ref 0.0–7.0)
HCT: 38.9 % (ref 34.8–46.6)
HGB: 13.3 g/dL (ref 11.6–15.9)
LYMPH%: 28.1 % (ref 14.0–49.7)
MCH: 29.5 pg (ref 25.1–34.0)
MCHC: 34.2 g/dL (ref 31.5–36.0)
MCV: 86.3 fL (ref 79.5–101.0)
MONO#: 0.2 10*3/uL (ref 0.1–0.9)
MONO%: 2.8 % (ref 0.0–14.0)
NEUT#: 5.9 10*3/uL (ref 1.5–6.5)
NEUT%: 67.9 % (ref 38.4–76.8)
Platelets: 337 10*3/uL (ref 145–400)
RBC: 4.51 10*6/uL (ref 3.70–5.45)
RDW: 13 % (ref 11.2–14.5)
WBC: 8.7 10*3/uL (ref 3.9–10.3)
lymph#: 2.5 10*3/uL (ref 0.9–3.3)

## 2016-01-28 LAB — COMPREHENSIVE METABOLIC PANEL
ALBUMIN: 4 g/dL (ref 3.5–5.0)
ALT: 10 U/L (ref 0–55)
AST: 14 U/L (ref 5–34)
Alkaline Phosphatase: 66 U/L (ref 40–150)
Anion Gap: 8 mEq/L (ref 3–11)
BUN: 13 mg/dL (ref 7.0–26.0)
CHLORIDE: 107 meq/L (ref 98–109)
CO2: 26 meq/L (ref 22–29)
Calcium: 9.4 mg/dL (ref 8.4–10.4)
Creatinine: 0.9 mg/dL (ref 0.6–1.1)
EGFR: >90 mL/min/{1.73_m2} (ref >90)
Glucose: 76 mg/dl (ref 70–140)
Potassium: 4.2 mEq/L (ref 3.5–5.1)
SODIUM: 141 meq/L (ref 136–145)
Total Bilirubin: <0.3 mg/dL (ref 0.20–1.20)
Total Protein: 7.2 g/dL (ref 6.4–8.3)

## 2016-01-28 LAB — LACTATE DEHYDROGENASE: LDH: 178 U/L (ref 125–245)

## 2016-01-28 NOTE — Assessment & Plan Note (Signed)
Her blood work is normal and she denies symptoms. She has no palpable lymphadenopathy on examination today. However, the patient is at high risk of disease recurrence. I will see her back in 3 months with repeat history, physical examination, blood work and CT scan.

## 2016-01-28 NOTE — Progress Notes (Signed)
[place] OFFICE PROGRESS NOTE  Patient Care Team: Pcp Not In System as PCP - General  SUMMARY OF ONCOLOGIC HISTORY: Oncology History   Diffuse large B cell lymphoma   Primary site: Lymphoid Neoplasms   Staging method: AJCC 6th Edition   Clinical: Stage IV signed by Heath Lark, MD on 09/03/2014  8:18 PM   Summary: Stage IV IPI of 3: high LDH, >2 extranodal disease and stage IV at presentation       History of B-cell lymphoma   07/30/2014 Imaging She had a bilateral mammogram which came back abnormal. Ultrasound-guided biopsy was performed, suspicious for lymphoma   08/25/2014 Imaging PET/CT scan showed diffuse metastatic cancer involving bilateral breasts, bone, and large abdominal mass   08/28/2014 Surgery She had excisional surgery and removal of right breast mass   08/28/2014 Pathology Results Accession: [id number] right breast mass is consistent with diffuse large B-cell lymphoma with high proliferation index.   09/04/2014 - 12/26/2014 Hospital Admission she ad 6 cycles of rituximab-EPOCH plus 4 cycles of CNS prophylaxis with IT MTX   09/04/2014 Imaging echocardiogram is normal.   09/04/2014 Procedure she has placement of PICC line.   10/14/2014 Imaging PET/CT scan showed near complete response to treatment.   12/17/2014 Imaging Repeat PET CT scan showed near complete response to treatment.   03/20/2015 Imaging CT chest, abdomen and pelvis showed no residual lymphoma but presence of bilateral enlarged ovaries.    INTERVAL HISTORY: Please see below for problem oriented charting. She feels well. Denies recent infection. No new lymphadenopathy.  REVIEW OF SYSTEMS:   Constitutional: Denies fevers, chills or abnormal weight loss Eyes: Denies blurriness of vision Ears, nose, mouth, throat, and face: Denies mucositis or sore throat Respiratory: Denies cough, dyspnea or wheezes Cardiovascular: Denies palpitation, chest discomfort or lower extremity  swelling Gastrointestinal:  Denies nausea, heartburn or change in bowel habits Skin: Denies abnormal skin rashes Lymphatics: Denies new lymphadenopathy or easy bruising Neurological:Denies numbness, tingling or new weaknesses Behavioral/Psych: Mood is stable, no new changes  All other systems were reviewed with the patient and are negative.  I have reviewed the past medical history, past surgical history, social history and family history with the patient and they are unchanged from previous note.  ALLERGIES:  is allergic to other and soap.  MEDICATIONS:  Current Outpatient Prescriptions  Medication Sig Dispense Refill  . Fish Oil OIL Take by mouth daily.     Marland Kitchen gabapentin (NEURONTIN) 300 MG capsule Take 1 capsule (300 mg total) by mouth 3 (three) times daily. 90 capsule 0  . Multiple Vitamin (MULTIVITAMIN) tablet Take 1 tablet by mouth daily.    . ST JOHNS WORT PO Take by mouth daily.      No current facility-administered medications for this visit.    PHYSICAL EXAMINATION: ECOG PERFORMANCE STATUS: 0 - Asymptomatic  Filed Vitals:   01/28/16 1427  BP: 132/68  Pulse: 75  Temp: 98.5 F (36.9 C)  Resp: 19   Filed Weights   01/28/16 1427  Weight: 245 lb 8 oz (111.358 kg)    GENERAL:alert, no distress and comfortable SKIN: skin color, texture, turgor are normal, no rashes or significant lesions EYES: normal, Conjunctiva are pink and non-injected, sclera clear OROPHARYNX:no exudate, no erythema and lips, buccal mucosa, and tongue normal  NECK: supple, thyroid normal size, non-tender, without nodularity LYMPH:  no palpable lymphadenopathy in the cervical, axillary or inguinal LUNGS: clear to auscultation and percussion with normal breathing effort HEART: regular rate & rhythm  and no murmurs and no lower extremity edema ABDOMEN:abdomen soft, non-tender and normal bowel sounds Musculoskeletal:no cyanosis of digits and no clubbing  NEURO: alert & oriented x 3 with fluent speech,  no focal motor/sensory deficits  LABORATORY DATA:  I have reviewed the data as listed    Component Value Date/Time   NA 141 10/30/2015 1244   NA 133* 12/26/2014 0530   K 4.2 10/30/2015 1244   K 3.5 12/26/2014 0530   CL 100 12/26/2014 0530   CO2 24 10/30/2015 1244   CO2 27 12/26/2014 0530   GLUCOSE 91 10/30/2015 1244   GLUCOSE 101* 12/26/2014 0530   BUN 11.0 10/30/2015 1244   BUN 12 12/26/2014 0530   CREATININE 0.8 10/30/2015 1244   CREATININE 0.52 12/26/2014 0530   CALCIUM 9.3 10/30/2015 1244   CALCIUM 9.2 12/26/2014 0530   PROT 6.7 10/30/2015 1244   PROT 6.7 12/26/2014 0530   ALBUMIN 3.9 10/30/2015 1244   ALBUMIN 3.9 12/26/2014 0530   AST 10 10/30/2015 1244   AST 13 12/26/2014 0530   ALT <9 10/30/2015 1244   ALT 15 12/26/2014 0530   ALKPHOS 84 10/30/2015 1244   ALKPHOS 71 12/26/2014 0530   BILITOT 0.30 10/30/2015 1244   BILITOT 0.7 12/26/2014 0530   GFRNONAA >90 12/26/2014 0530   GFRAA >90 12/26/2014 0530    No results found for: SPEP, UPEP  Lab Results  Component Value Date   WBC 8.7 01/28/2016   NEUTROABS 5.9 01/28/2016   HGB 13.3 01/28/2016   HCT 38.9 01/28/2016   MCV 86.3 01/28/2016   PLT 337 01/28/2016      Chemistry      Component Value Date/Time   NA 141 10/30/2015 1244   NA 133* 12/26/2014 0530   K 4.2 10/30/2015 1244   K 3.5 12/26/2014 0530   CL 100 12/26/2014 0530   CO2 24 10/30/2015 1244   CO2 27 12/26/2014 0530   BUN 11.0 10/30/2015 1244   BUN 12 12/26/2014 0530   CREATININE 0.8 10/30/2015 1244   CREATININE 0.52 12/26/2014 0530      Component Value Date/Time   CALCIUM 9.3 10/30/2015 1244   CALCIUM 9.2 12/26/2014 0530   ALKPHOS 84 10/30/2015 1244   ALKPHOS 71 12/26/2014 0530   AST 10 10/30/2015 1244   AST 13 12/26/2014 0530   ALT <9 10/30/2015 1244   ALT 15 12/26/2014 0530   BILITOT 0.30 10/30/2015 1244   BILITOT 0.7 12/26/2014 0530      ASSESSMENT & PLAN:  History of B-cell lymphoma  Her blood work is normal and she denies  symptoms. She has no palpable lymphadenopathy on examination today. However, the patient is at high risk of disease recurrence. I will see her back in 3 months with repeat history, physical examination, blood work and CT scan.    Pelvic mass in female The patient was noted to have pelvic mass at the initial diagnosis. The uterine mass has shrunk but she have persistent enlarged bilateral ovaries. Ultrasound in 2016 show regression in the size and the patient have return of menses. I recommend observation only for now I recommend repeat imaging study in 3 months for further assessment and she agreed    Orders Placed This Encounter  Procedures  . CT Chest W Contrast    Standing Status: Future     Number of Occurrences:      Standing Expiration Date: 04/29/2017    Order Specific Question:  Reason for Exam (SYMPTOM  OR DIAGNOSIS REQUIRED)  Answer:  staging lymphoma, enlarged ovaries, exclude recurrence    Order Specific Question:  Is the patient pregnant?    Answer:  No    Order Specific Question:  Preferred imaging location?    Answer:  [facility]  . CT Abdomen Pelvis W Contrast    Standing Status: Future     Number of Occurrences:      Standing Expiration Date: 04/29/2017    Order Specific Question:  Reason for Exam (SYMPTOM  OR DIAGNOSIS REQUIRED)    Answer:  staging lymphoma, enlarged ovaries, exclude recurrence    Order Specific Question:  Is the patient pregnant?    Answer:  No    Order Specific Question:  Preferred imaging location?    Answer:  [facility]  . CBC with Differential/Platelet    Standing Status: Future     Number of Occurrences:      Standing Expiration Date: 04/29/2017  . Comprehensive metabolic panel    Standing Status: Future     Number of Occurrences:      Standing Expiration Date: 04/29/2017   All questions were answered. The patient knows to call the clinic with any problems, questions or concerns. No barriers to learning was  detected. I spent 15 minutes counseling the patient face to face. The total time spent in the appointment was 20 minutes and more than 50% was on counseling and review of test results     [facility], [place], MD 01/28/2016 3:02 PM

## 2016-01-28 NOTE — Assessment & Plan Note (Signed)
The patient was noted to have pelvic mass at the initial diagnosis. The uterine mass has shrunk but she have persistent enlarged bilateral ovaries. Ultrasound in 2016 show regression in the size and the patient have return of menses. I recommend observation only for now I recommend repeat imaging study in 3 months for further assessment and she agreed

## 2016-01-28 NOTE — Telephone Encounter (Signed)
per pof to sch pt appt-adv pt central sch willc all to sch scan °

## 2016-02-18 ENCOUNTER — Other Ambulatory Visit: Payer: Self-pay | Admitting: Hematology and Oncology

## 2016-02-22 ENCOUNTER — Other Ambulatory Visit: Payer: Self-pay | Admitting: Hematology and Oncology

## 2016-02-24 ENCOUNTER — Other Ambulatory Visit: Payer: Self-pay | Admitting: *Deleted

## 2016-02-24 MED ORDER — GABAPENTIN 300 MG PO CAPS: 300.0000 mg | ORAL_CAPSULE | Freq: Three times a day (TID) | ORAL | Status: DC

## 2016-04-28 ENCOUNTER — Encounter: Payer: Self-pay | Admitting: *Deleted

## 2016-04-28 ENCOUNTER — Other Ambulatory Visit (HOSPITAL_BASED_OUTPATIENT_CLINIC_OR_DEPARTMENT_OTHER): Payer: Self-pay

## 2016-04-28 ENCOUNTER — Ambulatory Visit (HOSPITAL_COMMUNITY): Payer: PRIVATE HEALTH INSURANCE

## 2016-04-28 ENCOUNTER — Other Ambulatory Visit: Payer: PRIVATE HEALTH INSURANCE

## 2016-04-28 DIAGNOSIS — Z8572 Personal history of non-Hodgkin lymphomas: Secondary | ICD-10-CM

## 2016-04-28 LAB — CBC WITH DIFFERENTIAL/PLATELET
BASO%: 0.7 % (ref 0.0–2.0)
BASOS ABS: 0 10*3/uL (ref 0.0–0.1)
EOS%: 1.8 % (ref 0.0–7.0)
Eosinophils Absolute: 0.1 10*3/uL (ref 0.0–0.5)
HEMATOCRIT: 41.3 % (ref 34.8–46.6)
HGB: 13.9 g/dL (ref 11.6–15.9)
LYMPH#: 1.8 10*3/uL (ref 0.9–3.3)
LYMPH%: 28.8 % (ref 14.0–49.7)
MCH: 28.8 pg (ref 25.1–34.0)
MCHC: 33.7 g/dL (ref 31.5–36.0)
MCV: 85.7 fL (ref 79.5–101.0)
MONO#: 0.3 10*3/uL (ref 0.1–0.9)
MONO%: 4.6 % (ref 0.0–14.0)
NEUT#: 3.9 10*3/uL (ref 1.5–6.5)
NEUT%: 64.1 % (ref 38.4–76.8)
PLATELETS: 302 10*3/uL (ref 145–400)
RBC: 4.82 10*6/uL (ref 3.70–5.45)
RDW: 13.3 % (ref 11.2–14.5)
WBC: 6.1 10*3/uL (ref 3.9–10.3)

## 2016-04-28 LAB — COMPREHENSIVE METABOLIC PANEL
ALBUMIN: 3.7 g/dL (ref 3.5–5.0)
ALK PHOS: 62 U/L (ref 40–150)
ALT: 10 U/L (ref 0–55)
ANION GAP: 8 meq/L (ref 3–11)
AST: 11 U/L (ref 5–34)
BUN: 10 mg/dL (ref 7.0–26.0)
CALCIUM: 9.1 mg/dL (ref 8.4–10.4)
CHLORIDE: 110 meq/L — AB (ref 98–109)
CO2: 24 mEq/L (ref 22–29)
CREATININE: 0.8 mg/dL (ref 0.6–1.1)
EGFR: >90 mL/min/{1.73_m2} (ref >90)
Glucose: 88 mg/dl (ref 70–140)
Potassium: 4 mEq/L (ref 3.5–5.1)
Sodium: 141 mEq/L (ref 136–145)
Total Bilirubin: 0.4 mg/dL (ref 0.20–1.20)
Total Protein: 6.7 g/dL (ref 6.4–8.3)

## 2016-04-28 NOTE — Progress Notes (Unsigned)
Lm on VM for Lindsey Irwin, per dr Alvy Bimler patient's lab and CT scan not scheduled for today and patient has a visit with dr Alvy Bimler tomorrow, 04/29/16 to review scan. Please advise

## 2016-04-29 ENCOUNTER — Ambulatory Visit (HOSPITAL_BASED_OUTPATIENT_CLINIC_OR_DEPARTMENT_OTHER): Payer: PRIVATE HEALTH INSURANCE | Admitting: Hematology and Oncology

## 2016-04-29 ENCOUNTER — Telehealth: Payer: Self-pay | Admitting: Hematology and Oncology

## 2016-04-29 ENCOUNTER — Encounter: Payer: Self-pay | Admitting: Hematology and Oncology

## 2016-04-29 VITALS — BP 114/63 | HR 66 | Temp 98.5°F | Resp 20 | Ht 66.0 in | Wt 254.2 lb

## 2016-04-29 DIAGNOSIS — Z8572 Personal history of non-Hodgkin lymphomas: Secondary | ICD-10-CM | POA: Diagnosis not present

## 2016-04-29 NOTE — Assessment & Plan Note (Signed)
Her blood work is normal and she denies symptoms. She has no palpable lymphadenopathy on examination today. She has declined imaging study because of insurance issue. I think is reasonable to not do imaging study now I will see her back in 6 months with repeat history, physical examination, blood work and defer imaging study till next year.

## 2016-04-29 NOTE — Telephone Encounter (Signed)
Gave and printed appt sched and avs for pt for DEc °

## 2016-04-29 NOTE — Progress Notes (Signed)
[place] OFFICE PROGRESS NOTE  Patient Care Team: Pcp Not In System as PCP - General  SUMMARY OF ONCOLOGIC HISTORY: Oncology History   Diffuse large B cell lymphoma   Primary site: Lymphoid Neoplasms   Staging method: AJCC 6th Edition   Clinical: Stage IV signed by Heath Lark, MD on 09/03/2014  8:18 PM   Summary: Stage IV IPI of 3: high LDH, >2 extranodal disease and stage IV at presentation       History of B-cell lymphoma   07/30/2014 Imaging She had a bilateral mammogram which came back abnormal. Ultrasound-guided biopsy was performed, suspicious for lymphoma   08/25/2014 Imaging PET/CT scan showed diffuse metastatic cancer involving bilateral breasts, bone, and large abdominal mass   08/28/2014 Surgery She had excisional surgery and removal of right breast mass   08/28/2014 Pathology Results Accession: [id number] right breast mass is consistent with diffuse large B-cell lymphoma with high proliferation index.   09/04/2014 - 12/26/2014 Hospital Admission she ad 6 cycles of rituximab-EPOCH plus 4 cycles of CNS prophylaxis with IT MTX   09/04/2014 Imaging echocardiogram is normal.   09/04/2014 Procedure she has placement of PICC line.   10/14/2014 Imaging PET/CT scan showed near complete response to treatment.   12/17/2014 Imaging Repeat PET CT scan showed near complete response to treatment.   03/20/2015 Imaging CT chest, abdomen and pelvis showed no residual lymphoma but presence of bilateral enlarged ovaries.    INTERVAL HISTORY: Please see below for problem oriented charting. She feels well. She declined imaging study due to lack of symptoms and concern for financial payments for imaging study She denies lymphadenopathy She denies changes to her menstrual cycle She denies recent infection She denies anorexia, abnormal night sweats or weight loss  REVIEW OF SYSTEMS:   Constitutional: Denies fevers, chills or abnormal weight loss Eyes: Denies blurriness of  vision Ears, nose, mouth, throat, and face: Denies mucositis or sore throat Respiratory: Denies cough, dyspnea or wheezes Cardiovascular: Denies palpitation, chest discomfort or lower extremity swelling Gastrointestinal:  Denies nausea, heartburn or change in bowel habits Skin: Denies abnormal skin rashes Lymphatics: Denies new lymphadenopathy or easy bruising Neurological:Denies numbness, tingling or new weaknesses Behavioral/Psych: Mood is stable, no new changes  All other systems were reviewed with the patient and are negative.  I have reviewed the past medical history, past surgical history, social history and family history with the patient and they are unchanged from previous note.  ALLERGIES:  is allergic to other and soap.  MEDICATIONS:  Current Outpatient Prescriptions  Medication Sig Dispense Refill  . Fish Oil OIL Take by mouth daily.     . Multiple Vitamin (MULTIVITAMIN) tablet Take 1 tablet by mouth daily.    . ST JOHNS WORT PO Take by mouth daily.     Marland Kitchen gabapentin (NEURONTIN) 300 MG capsule Take 1 capsule (300 mg total) by mouth 3 (three) times daily. (Patient not taking: Reported on 04/29/2016) 90 capsule 0   No current facility-administered medications for this visit.    PHYSICAL EXAMINATION: ECOG PERFORMANCE STATUS: 0 - Asymptomatic  Filed Vitals:   04/29/16 0818  BP: 114/63  Pulse: 66  Temp: 98.5 F (36.9 C)  Resp: 20   Filed Weights   04/29/16 0818  Weight: 254 lb 3.2 oz (115.304 kg)    GENERAL:alert, no distress and comfortable. She is obese SKIN: skin color, texture, turgor are normal, no rashes or significant lesions EYES: normal, Conjunctiva are pink and non-injected, sclera clear OROPHARYNX:no exudate, no  erythema and lips, buccal mucosa, and tongue normal  NECK: supple, thyroid normal size, non-tender, without nodularity LYMPH:  no palpable lymphadenopathy in the cervical, axillary or inguinal LUNGS: clear to auscultation and percussion with  normal breathing effort HEART: regular rate & rhythm and no murmurs and no lower extremity edema ABDOMEN:abdomen soft, non-tender and normal bowel sounds Musculoskeletal:no cyanosis of digits and no clubbing  NEURO: alert & oriented x 3 with fluent speech, no focal motor/sensory deficits  LABORATORY DATA:  I have reviewed the data as listed    Component Value Date/Time   NA 141 04/28/2016 0823   NA 133* 12/26/2014 0530   K 4.0 04/28/2016 0823   K 3.5 12/26/2014 0530   CL 100 12/26/2014 0530   CO2 24 04/28/2016 0823   CO2 27 12/26/2014 0530   GLUCOSE 88 04/28/2016 0823   GLUCOSE 101* 12/26/2014 0530   BUN 10.0 04/28/2016 0823   BUN 12 12/26/2014 0530   CREATININE 0.8 04/28/2016 0823   CREATININE 0.52 12/26/2014 0530   CALCIUM 9.1 04/28/2016 0823   CALCIUM 9.2 12/26/2014 0530   PROT 6.7 04/28/2016 0823   PROT 6.7 12/26/2014 0530   ALBUMIN 3.7 04/28/2016 0823   ALBUMIN 3.9 12/26/2014 0530   AST 11 04/28/2016 0823   AST 13 12/26/2014 0530   ALT 10 04/28/2016 0823   ALT 15 12/26/2014 0530   ALKPHOS 62 04/28/2016 0823   ALKPHOS 71 12/26/2014 0530   BILITOT 0.40 04/28/2016 0823   BILITOT 0.7 12/26/2014 0530   GFRNONAA >90 12/26/2014 0530   GFRAA >90 12/26/2014 0530    No results found for: SPEP, UPEP  Lab Results  Component Value Date   WBC 6.1 04/28/2016   NEUTROABS 3.9 04/28/2016   HGB 13.9 04/28/2016   HCT 41.3 04/28/2016   MCV 85.7 04/28/2016   PLT 302 04/28/2016      Chemistry      Component Value Date/Time   NA 141 04/28/2016 0823   NA 133* 12/26/2014 0530   K 4.0 04/28/2016 0823   K 3.5 12/26/2014 0530   CL 100 12/26/2014 0530   CO2 24 04/28/2016 0823   CO2 27 12/26/2014 0530   BUN 10.0 04/28/2016 0823   BUN 12 12/26/2014 0530   CREATININE 0.8 04/28/2016 0823   CREATININE 0.52 12/26/2014 0530      Component Value Date/Time   CALCIUM 9.1 04/28/2016 0823   CALCIUM 9.2 12/26/2014 0530   ALKPHOS 62 04/28/2016 0823   ALKPHOS 71 12/26/2014 0530   AST  11 04/28/2016 0823   AST 13 12/26/2014 0530   ALT 10 04/28/2016 0823   ALT 15 12/26/2014 0530   BILITOT 0.40 04/28/2016 0823   BILITOT 0.7 12/26/2014 0530     ASSESSMENT & PLAN:  History of B-cell lymphoma Her blood work is normal and she denies symptoms. She has no palpable lymphadenopathy on examination today. She has declined imaging study because of insurance issue. I think is reasonable to not do imaging study now I will see her back in 6 months with repeat history, physical examination, blood work and defer imaging study till next year.   Orders Placed This Encounter  Procedures  . CBC with Differential/Platelet    Standing Status: Future     Number of Occurrences:      Standing Expiration Date: 06/03/2017  . Comprehensive metabolic panel    Standing Status: Future     Number of Occurrences:      Standing Expiration Date: 06/03/2017  .  Lactate dehydrogenase (LDH)    Standing Status: Future     Number of Occurrences:      Standing Expiration Date: 06/03/2017   All questions were answered. The patient knows to call the clinic with any problems, questions or concerns. No barriers to learning was detected. I spent 10 minutes counseling the patient face to face. The total time spent in the appointment was 15 minutes and more than 50% was on counseling and review of test results     [facility],  [place], MD 04/29/2016 9:00 AM

## 2016-05-04 IMAGING — RF DG FLUORO GUIDE SPINAL/SI JT INJ*L*
1 series · 1 of 1 positions shown · non-contrast
Comparison: none

CLINICAL DATA: Diffuse large B-cell lymphoma

EXAM:
FLUOROSCOPICALLY GUIDED LUMBAR PUNCTURE FOR INTRATHECAL
CHEMOTHERAPY
TECHNIQUE: Informed consent was obtained from the patient prior to the
procedure, including potential complications of headache, allergy,
and pain. A 'time out' was performed. With the patient prone, the
lower back was prepped with Betadine. 1% Lidocaine was used for
local anesthesia. Lumbar puncture was performed at the L4-L5 level
using a 22 gauge needle with return of clear CSF. 12 mg of
methotrexate was injected into the subarachnoid space. The patient
tolerated the procedure well without apparent complication.
FLUOROSCOPY TIME:  18 seconds

[Series 1: run · 1 of 1 slices shown]
[im 1/1]
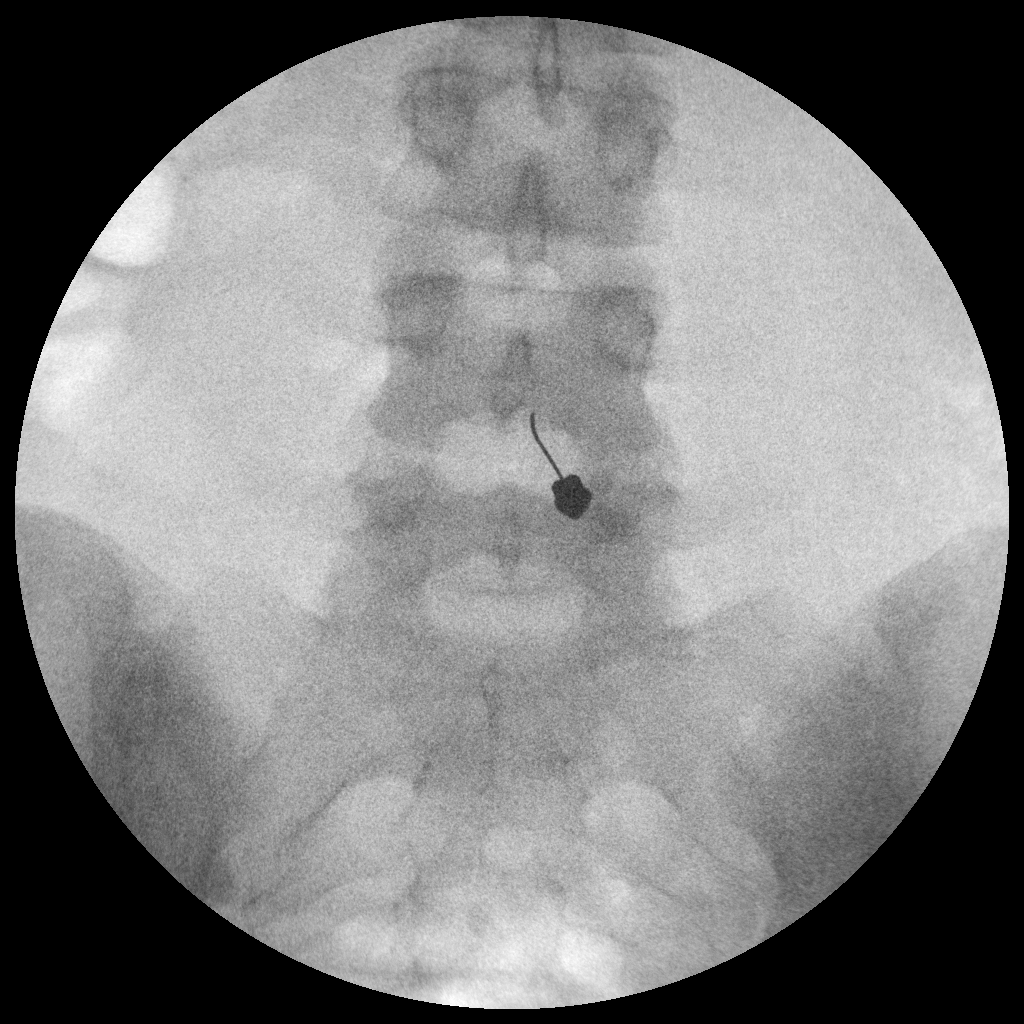

[1 of 1 positions shown; findings below may reference images not displayed]

IMPRESSION: Intrathecal injection of chemotherapy without complication

## 2016-05-24 IMAGING — DX DG FOOT COMPLETE 3+V*R*
3 series · 3 of 3 positions shown · non-contrast
Comparison: None.

CLINICAL DATA: Twisted ankle post jumping on trampoline in
12/01/2014

EXAM:
RIGHT FOOT COMPLETE - 3+ VIEW

[foot ap]
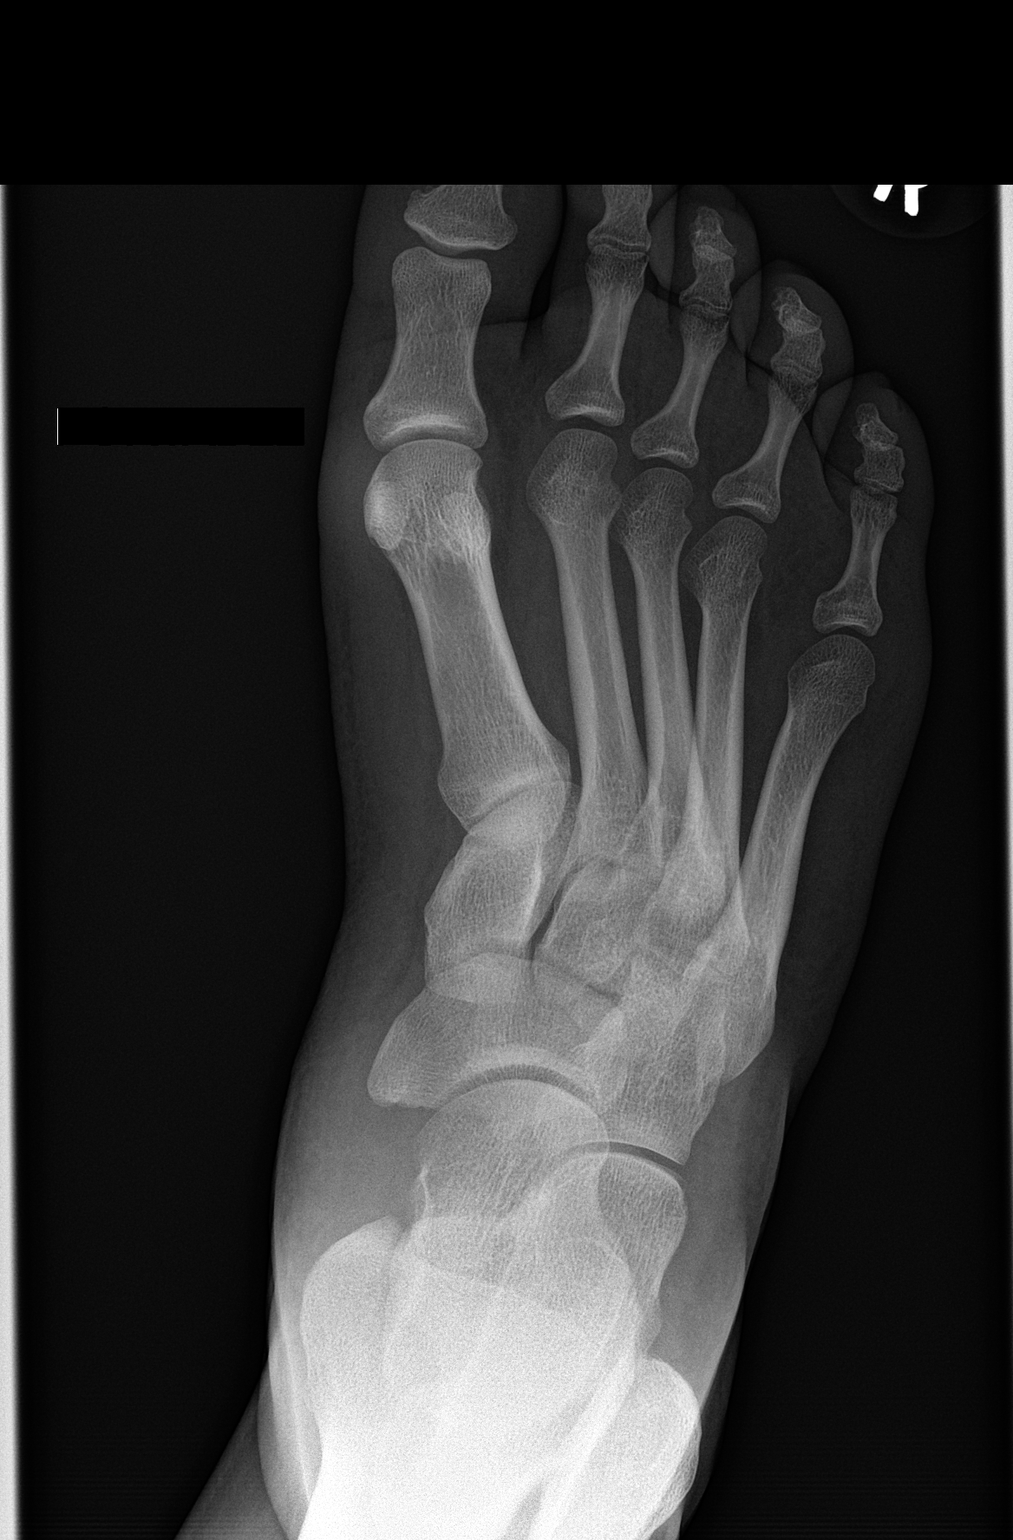

[foot obl]
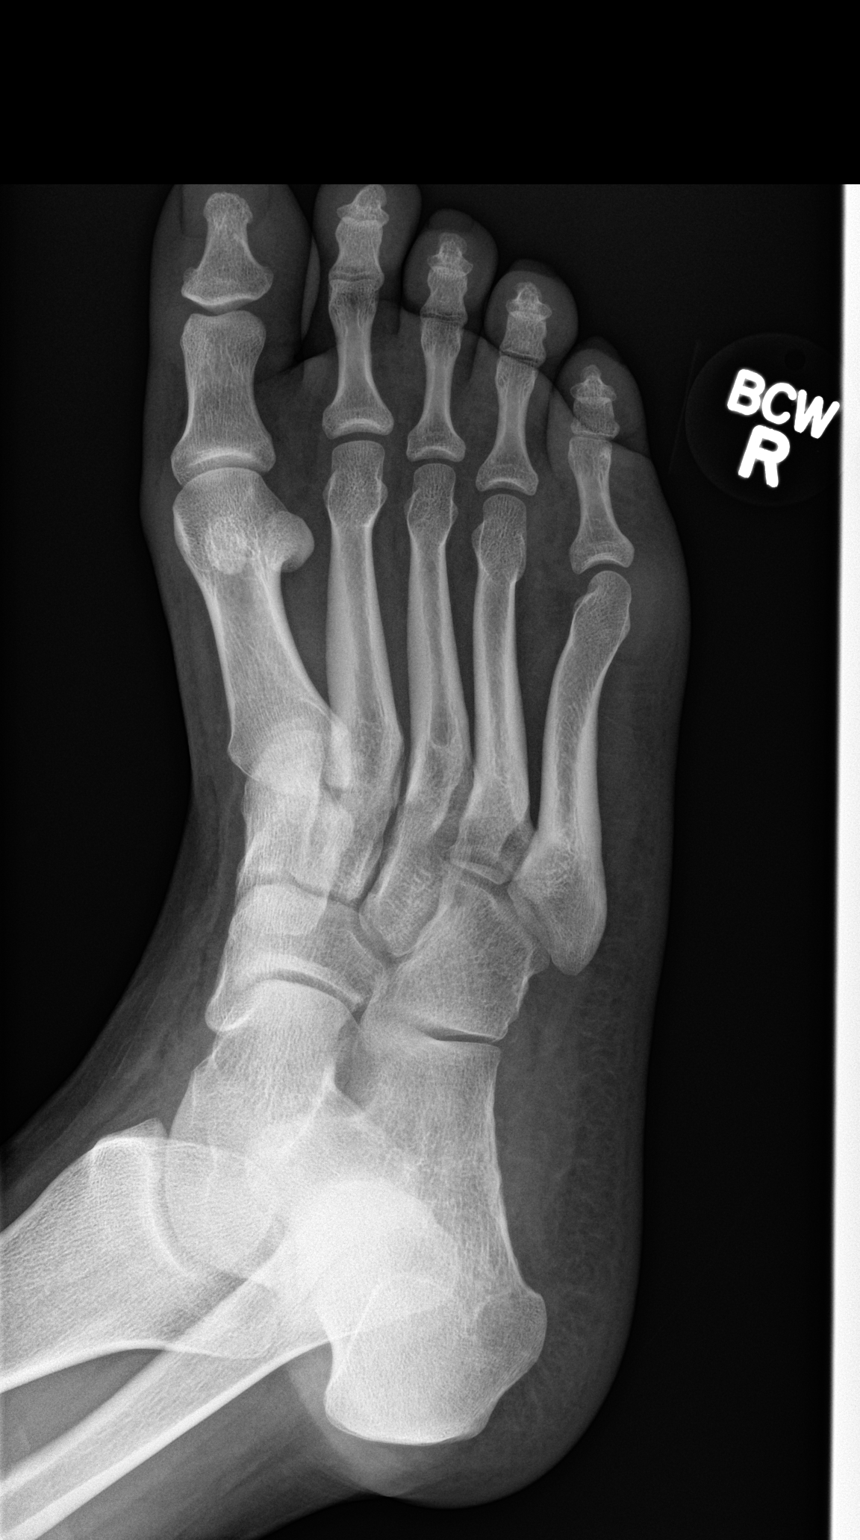

[foot lat]
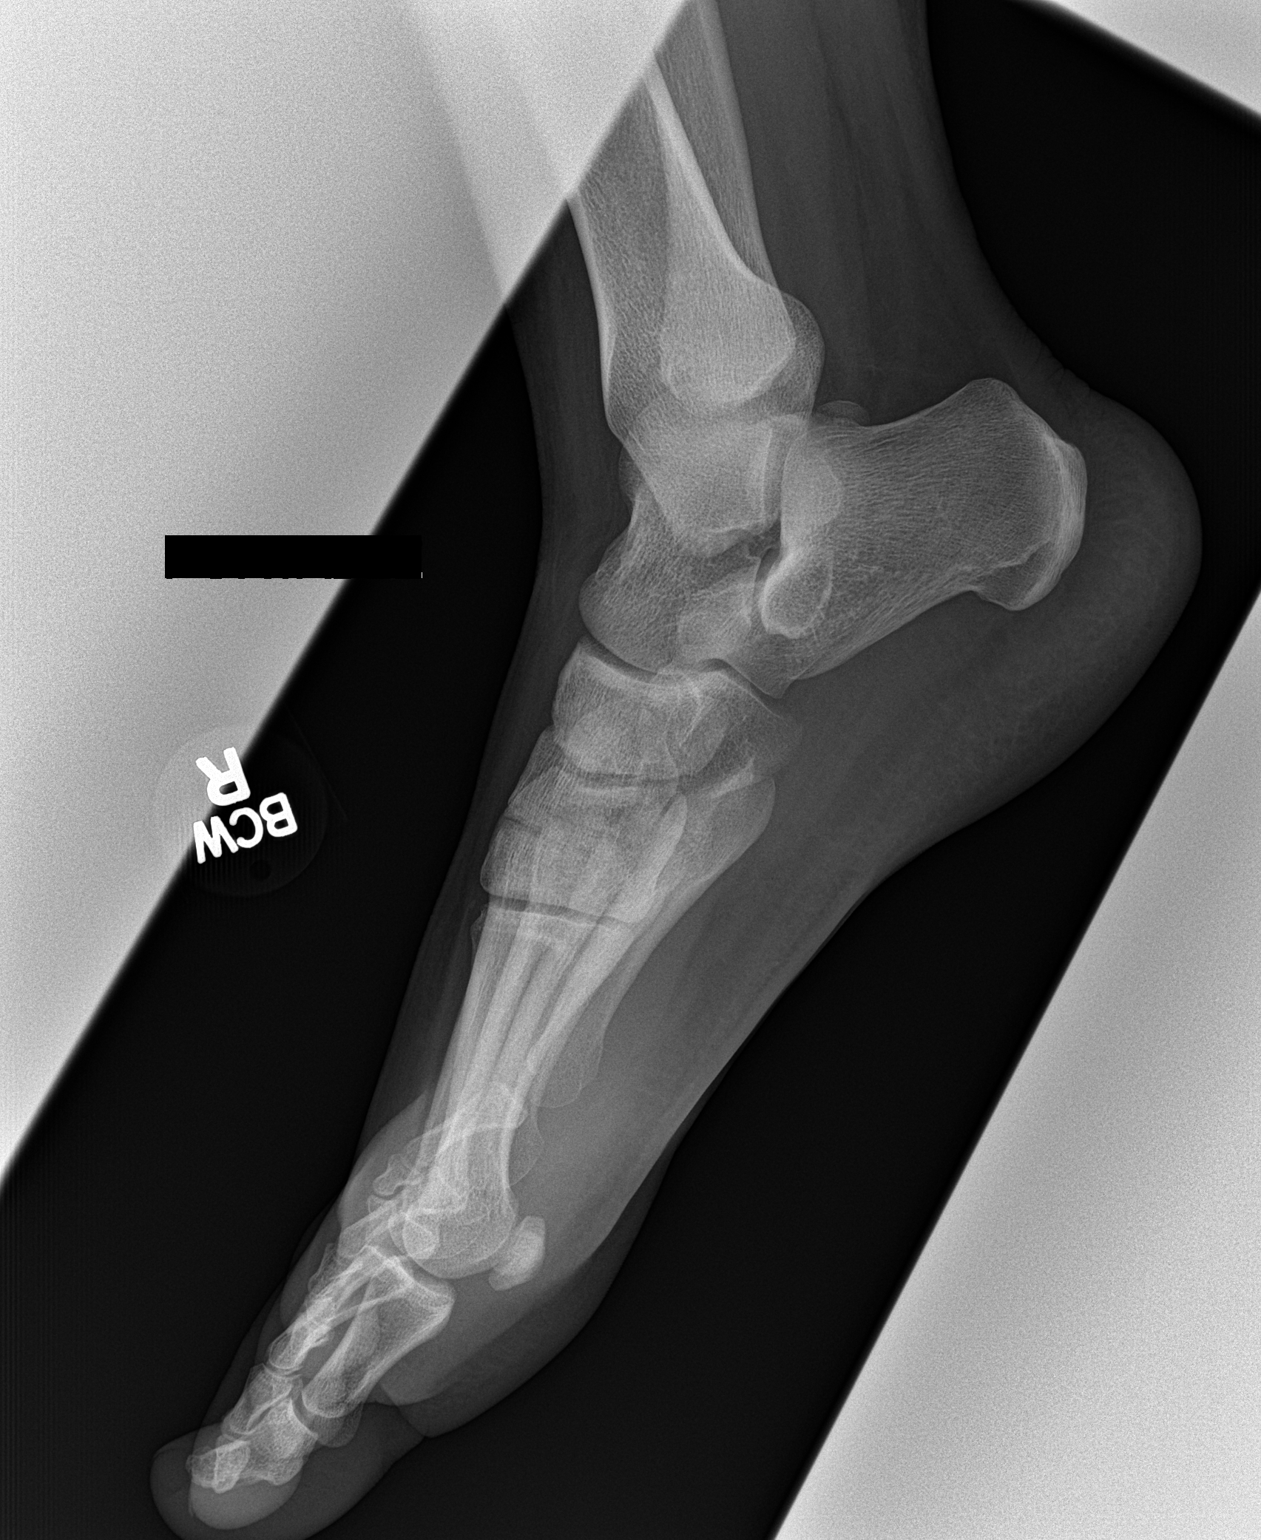

[3 of 3 positions shown; findings below may reference images not displayed]

FINDINGS: Three views of the right foot submitted. No acute fracture or
subluxation. No radiopaque foreign body.
IMPRESSION: Negative.

## 2016-10-28 ENCOUNTER — Other Ambulatory Visit (HOSPITAL_BASED_OUTPATIENT_CLINIC_OR_DEPARTMENT_OTHER): Payer: Self-pay

## 2016-10-28 ENCOUNTER — Encounter: Payer: Self-pay | Admitting: Hematology and Oncology

## 2016-10-28 ENCOUNTER — Ambulatory Visit (HOSPITAL_BASED_OUTPATIENT_CLINIC_OR_DEPARTMENT_OTHER): Payer: Self-pay | Admitting: Hematology and Oncology

## 2016-10-28 ENCOUNTER — Telehealth: Payer: Self-pay | Admitting: Hematology and Oncology

## 2016-10-28 VITALS — BP 123/66 | HR 83 | Temp 97.9°F | Resp 19 | Ht 66.0 in | Wt 276.5 lb

## 2016-10-28 DIAGNOSIS — Z8572 Personal history of non-Hodgkin lymphomas: Secondary | ICD-10-CM

## 2016-10-28 LAB — COMPREHENSIVE METABOLIC PANEL
ALT: 11 U/L (ref 0–55)
ANION GAP: 9 meq/L (ref 3–11)
AST: 11 U/L (ref 5–34)
Albumin: 3.6 g/dL (ref 3.5–5.0)
Alkaline Phosphatase: 85 U/L (ref 40–150)
BILIRUBIN TOTAL: 0.29 mg/dL (ref 0.20–1.20)
BUN: 11.5 mg/dL (ref 7.0–26.0)
CHLORIDE: 108 meq/L (ref 98–109)
CO2: 26 meq/L (ref 22–29)
Calcium: 9.5 mg/dL (ref 8.4–10.4)
Creatinine: 0.8 mg/dL (ref 0.6–1.1)
GLUCOSE: 102 mg/dL (ref 70–140)
POTASSIUM: 4.9 meq/L (ref 3.5–5.1)
SODIUM: 143 meq/L (ref 136–145)
Total Protein: 7.2 g/dL (ref 6.4–8.3)

## 2016-10-28 LAB — CBC WITH DIFFERENTIAL/PLATELET
BASO%: 0.5 % (ref 0.0–2.0)
Basophils Absolute: 0 10*3/uL (ref 0.0–0.1)
EOS ABS: 0.1 10*3/uL (ref 0.0–0.5)
EOS%: 1.6 % (ref 0.0–7.0)
HCT: 41.8 % (ref 34.8–46.6)
HGB: 13.8 g/dL (ref 11.6–15.9)
LYMPH%: 24 % (ref 14.0–49.7)
MCH: 28.4 pg (ref 25.1–34.0)
MCHC: 33 g/dL (ref 31.5–36.0)
MCV: 86 fL (ref 79.5–101.0)
MONO#: 0.3 10*3/uL (ref 0.1–0.9)
MONO%: 3.8 % (ref 0.0–14.0)
NEUT#: 6.1 10*3/uL (ref 1.5–6.5)
NEUT%: 70.1 % (ref 38.4–76.8)
PLATELETS: 389 10*3/uL (ref 145–400)
RBC: 4.86 10*6/uL (ref 3.70–5.45)
RDW: 13.3 % (ref 11.2–14.5)
WBC: 8.7 10*3/uL (ref 3.9–10.3)
lymph#: 2.1 10*3/uL (ref 0.9–3.3)

## 2016-10-28 LAB — LACTATE DEHYDROGENASE: LDH: 161 U/L (ref 125–245)

## 2016-10-28 NOTE — Assessment & Plan Note (Signed)
The patient is morbidly obese and has gained over 20 pounds in 6 months. We discussed healthy living, exercise and dietary modification. She appears motivated to lose some weight

## 2016-10-28 NOTE — Assessment & Plan Note (Signed)
Her blood work is normal and she denies symptoms. She has no palpable lymphadenopathy on examination today. She has declined imaging study because of insurance issue. I think is reasonable not to do imaging study now She is educated to watch out for signs and symptoms of cancer recurrence I will see her back in 6 months with repeat history, physical examination, blood work  We discussed influenza vaccination but she declined

## 2016-10-28 NOTE — Progress Notes (Signed)
[facility] OFFICE PROGRESS NOTE  Patient Care Team: Pcp Not In System as PCP - General  SUMMARY OF ONCOLOGIC HISTORY: Oncology History   Diffuse large B cell lymphoma   Primary site: Lymphoid Neoplasms   Staging method: AJCC 6th Edition   Clinical: Stage IV signed by Heath Lark, MD on 09/03/2014  8:18 PM   Summary: Stage IV IPI of 3: high LDH, >2 extranodal disease and stage IV at presentation       History of B-cell lymphoma   07/30/2014 Imaging    She had a bilateral mammogram which came back abnormal. Ultrasound-guided biopsy was performed, suspicious for lymphoma      08/25/2014 Imaging    PET/CT scan showed diffuse metastatic cancer involving bilateral breasts, bone, and large abdominal mass      08/28/2014 Surgery    She had excisional surgery and removal of right breast mass      08/28/2014 Pathology Results    Accession: [id number] right breast mass is consistent with diffuse large B-cell lymphoma with high proliferation index.      09/04/2014 - 12/26/2014 Hospital Admission    she ad 6 cycles of rituximab-EPOCH plus 4 cycles of CNS prophylaxis with IT MTX      09/04/2014 Imaging    echocardiogram is normal.      09/04/2014 Procedure    she has placement of PICC line.      10/14/2014 Imaging    PET/CT scan showed near complete response to treatment.      12/17/2014 Imaging    Repeat PET CT scan showed near complete response to treatment.      03/20/2015 Imaging    CT chest, abdomen and pelvis showed no residual lymphoma but presence of bilateral enlarged ovaries.       INTERVAL HISTORY: Please see below for problem oriented charting. She returns for follow-up. She feels well. No new lymphadenopathy. Denies recent infection. She has excessive waking in the last 6 months  REVIEW OF SYSTEMS:   Constitutional: Denies fevers, chills or abnormal weight loss Eyes: Denies blurriness of vision Ears, nose, mouth, throat, and face: Denies  mucositis or sore throat Respiratory: Denies cough, dyspnea or wheezes Cardiovascular: Denies palpitation, chest discomfort or lower extremity swelling Gastrointestinal:  Denies nausea, heartburn or change in bowel habits Skin: Denies abnormal skin rashes Lymphatics: Denies new lymphadenopathy or easy bruising Neurological:Denies numbness, tingling or new weaknesses Behavioral/Psych: Mood is stable, no new changes  All other systems were reviewed with the patient and are negative.  I have reviewed the past medical history, past surgical history, social history and family history with the patient and they are unchanged from previous note.  ALLERGIES:  is allergic to other and soap.  MEDICATIONS:  No current outpatient prescriptions on file.   No current facility-administered medications for this visit.     PHYSICAL EXAMINATION: ECOG PERFORMANCE STATUS: 0 - Asymptomatic  Vitals:   10/28/16 0850  BP: 123/66  Pulse: 83  Resp: 19  Temp: 97.9 F (36.6 C)   Filed Weights   10/28/16 0850  Weight: 276 lb 8 oz (125.4 kg)    GENERAL:alert, no distress and comfortable. She is morbidly obese SKIN: skin color, texture, turgor are normal, no rashes or significant lesions EYES: normal, Conjunctiva are pink and non-injected, sclera clear OROPHARYNX:no exudate, no erythema and lips, buccal mucosa, and tongue normal  NECK: supple, thyroid normal size, non-tender, without nodularity LYMPH:  no palpable lymphadenopathy in the cervical, axillary or inguinal LUNGS:  clear to auscultation and percussion with normal breathing effort HEART: regular rate & rhythm and no murmurs and no lower extremity edema ABDOMEN:abdomen soft, non-tender and normal bowel sounds Musculoskeletal:no cyanosis of digits and no clubbing  NEURO: alert & oriented x 3 with fluent speech, no focal motor/sensory deficits  LABORATORY DATA:  I have reviewed the data as listed    Component Value Date/Time   NA 141  04/28/2016 0823   K 4.0 04/28/2016 0823   CL 100 12/26/2014 0530   CO2 24 04/28/2016 0823   GLUCOSE 88 04/28/2016 0823   BUN 10.0 04/28/2016 0823   CREATININE 0.8 04/28/2016 0823   CALCIUM 9.1 04/28/2016 0823   PROT 6.7 04/28/2016 0823   ALBUMIN 3.7 04/28/2016 0823   AST 11 04/28/2016 0823   ALT 10 04/28/2016 0823   ALKPHOS 62 04/28/2016 0823   BILITOT 0.40 04/28/2016 0823   GFRNONAA >90 12/26/2014 0530   GFRAA >90 12/26/2014 0530    No results found for: SPEP, UPEP  Lab Results  Component Value Date   WBC 8.7 10/28/2016   NEUTROABS 6.1 10/28/2016   HGB 13.8 10/28/2016   HCT 41.8 10/28/2016   MCV 86.0 10/28/2016   PLT 389 10/28/2016      Chemistry      Component Value Date/Time   NA 141 04/28/2016 0823   K 4.0 04/28/2016 0823   CL 100 12/26/2014 0530   CO2 24 04/28/2016 0823   BUN 10.0 04/28/2016 0823   CREATININE 0.8 04/28/2016 0823      Component Value Date/Time   CALCIUM 9.1 04/28/2016 0823   ALKPHOS 62 04/28/2016 0823   AST 11 04/28/2016 0823   ALT 10 04/28/2016 0823   BILITOT 0.40 04/28/2016 0823      ASSESSMENT & PLAN:  History of B-cell lymphoma Her blood work is normal and she denies symptoms. She has no palpable lymphadenopathy on examination today. She has declined imaging study because of insurance issue. I think is reasonable not to do imaging study now She is educated to watch out for signs and symptoms of cancer recurrence I will see her back in 6 months with repeat history, physical examination, blood work  We discussed influenza vaccination but she declined  Morbid obesity due to excess calories ([place]) The patient is morbidly obese and has gained over 20 pounds in 6 months. We discussed healthy living, exercise and dietary modification. She appears motivated to lose some weight   Orders Placed This Encounter  Procedures  . Comprehensive metabolic panel    Standing Status:   Future    Standing Expiration Date:   12/02/2017  . CBC with  Differential/Platelet    Standing Status:   Future    Standing Expiration Date:   12/02/2017  . Lactate dehydrogenase    Standing Status:   Future    Standing Expiration Date:   12/02/2017   All questions were answered. The patient knows to call the clinic with any problems, questions or concerns. No barriers to learning was detected. I spent 15 minutes counseling the patient face to face. The total time spent in the appointment was 20 minutes and more than 50% was on counseling and review of test results     Heath Lark, MD 10/28/2016 9:06 AM

## 2016-10-28 NOTE — Telephone Encounter (Signed)
Appointments scheduled per 12/15 LOS

## 2017-05-01 ENCOUNTER — Ambulatory Visit (HOSPITAL_BASED_OUTPATIENT_CLINIC_OR_DEPARTMENT_OTHER): Payer: Self-pay | Admitting: Hematology and Oncology

## 2017-05-01 ENCOUNTER — Encounter: Payer: Self-pay | Admitting: Hematology and Oncology

## 2017-05-01 ENCOUNTER — Telehealth: Payer: Self-pay | Admitting: Hematology and Oncology

## 2017-05-01 ENCOUNTER — Other Ambulatory Visit (HOSPITAL_BASED_OUTPATIENT_CLINIC_OR_DEPARTMENT_OTHER): Payer: Self-pay

## 2017-05-01 DIAGNOSIS — Z8572 Personal history of non-Hodgkin lymphomas: Secondary | ICD-10-CM

## 2017-05-01 LAB — CBC WITH DIFFERENTIAL/PLATELET
BASO%: 0.4 % (ref 0.0–2.0)
BASOS ABS: 0 10*3/uL (ref 0.0–0.1)
EOS%: 1 % (ref 0.0–7.0)
Eosinophils Absolute: 0.1 10*3/uL (ref 0.0–0.5)
HEMATOCRIT: 41.7 % (ref 34.8–46.6)
HGB: 13.8 g/dL (ref 11.6–15.9)
LYMPH#: 2.6 10*3/uL (ref 0.9–3.3)
LYMPH%: 25.9 % (ref 14.0–49.7)
MCH: 28.5 pg (ref 25.1–34.0)
MCHC: 33.1 g/dL (ref 31.5–36.0)
MCV: 86 fL (ref 79.5–101.0)
MONO#: 0.4 10*3/uL (ref 0.1–0.9)
MONO%: 3.7 % (ref 0.0–14.0)
NEUT#: 6.9 10*3/uL — ABNORMAL HIGH (ref 1.5–6.5)
NEUT%: 69 % (ref 38.4–76.8)
PLATELETS: 359 10*3/uL (ref 145–400)
RBC: 4.85 10*6/uL (ref 3.70–5.45)
RDW: 13.4 % (ref 11.2–14.5)
WBC: 10 10*3/uL (ref 3.9–10.3)

## 2017-05-01 LAB — COMPREHENSIVE METABOLIC PANEL
ALT: 12 U/L (ref 0–55)
AST: 12 U/L (ref 5–34)
Albumin: 3.7 g/dL (ref 3.5–5.0)
Alkaline Phosphatase: 79 U/L (ref 40–150)
Anion Gap: 9 mEq/L (ref 3–11)
BUN: 8.7 mg/dL (ref 7.0–26.0)
CO2: 25 meq/L (ref 22–29)
Calcium: 9.6 mg/dL (ref 8.4–10.4)
Chloride: 108 mEq/L (ref 98–109)
Creatinine: 0.9 mg/dL (ref 0.6–1.1)
GLUCOSE: 99 mg/dL (ref 70–140)
Potassium: 3.9 mEq/L (ref 3.5–5.1)
SODIUM: 142 meq/L (ref 136–145)
TOTAL PROTEIN: 7.2 g/dL (ref 6.4–8.3)
Total Bilirubin: 0.25 mg/dL (ref 0.20–1.20)

## 2017-05-01 NOTE — Assessment & Plan Note (Signed)
Her blood work is normal and she denies symptoms. She has no palpable lymphadenopathy on examination today. She has declined imaging study because of insurance issue. I think is reasonable not to do imaging study now Clinically, she has no signs or symptoms to suggest disease recurrence She is educated to watch out for signs and symptoms of cancer recurrence I will see her back in 6 months with repeat history, physical examination, blood work

## 2017-05-01 NOTE — Telephone Encounter (Signed)
Appointments scheduled per 05/01/17 los. °Patient was given a copy of the AVS report and appointment schedule, per 05/01/17 los. °

## 2017-05-01 NOTE — Progress Notes (Signed)
[facility] OFFICE PROGRESS NOTE  Patient Care Team: System, Pcp Not In as PCP - General  SUMMARY OF ONCOLOGIC HISTORY: Oncology History   Diffuse large B cell lymphoma   Primary site: Lymphoid Neoplasms   Staging method: AJCC 6th Edition   Clinical: Stage IV signed by Heath Lark, MD on 09/03/2014  8:18 PM   Summary: Stage IV IPI of 3: high LDH, >2 extranodal disease and stage IV at presentation       History of B-cell lymphoma   07/30/2014 Imaging    She had a bilateral mammogram which came back abnormal. Ultrasound-guided biopsy was performed, suspicious for lymphoma      08/25/2014 Imaging    PET/CT scan showed diffuse metastatic cancer involving bilateral breasts, bone, and large abdominal mass      08/28/2014 Surgery    She had excisional surgery and removal of right breast mass      08/28/2014 Pathology Results    Accession: [id number] right breast mass is consistent with diffuse large B-cell lymphoma with high proliferation index.      09/04/2014 - 12/26/2014 Hospital Admission    she ad 6 cycles of rituximab-EPOCH plus 4 cycles of CNS prophylaxis with IT MTX      09/04/2014 Imaging    echocardiogram is normal.      09/04/2014 Procedure    she has placement of PICC line.      10/14/2014 Imaging    PET/CT scan showed near complete response to treatment.      12/17/2014 Imaging    Repeat PET CT scan showed near complete response to treatment.      03/20/2015 Imaging    CT chest, abdomen and pelvis showed no residual lymphoma but presence of bilateral enlarged ovaries.       INTERVAL HISTORY: Please see below for problem oriented charting. She returns for further follow-up She feels well She denies new lymphadenopathy No recent infection She has some mild weight gain since the last time I saw her She had very mild peripheral and residual neuropathy from prior exam, stable  REVIEW OF SYSTEMS:   Constitutional: Denies fevers, chills or  abnormal weight loss Eyes: Denies blurriness of vision Ears, nose, mouth, throat, and face: Denies mucositis or sore throat Respiratory: Denies cough, dyspnea or wheezes Cardiovascular: Denies palpitation, chest discomfort or lower extremity swelling Gastrointestinal:  Denies nausea, heartburn or change in bowel habits Skin: Denies abnormal skin rashes Lymphatics: Denies new lymphadenopathy or easy bruising Neurological:Denies numbness, tingling or new weaknesses Behavioral/Psych: Mood is stable, no new changes  All other systems were reviewed with the patient and are negative.  I have reviewed the past medical history, past surgical history, social history and family history with the patient and they are unchanged from previous note.  ALLERGIES:  is allergic to other and soap.  MEDICATIONS:  No current outpatient prescriptions on file.   No current facility-administered medications for this visit.     PHYSICAL EXAMINATION: ECOG PERFORMANCE STATUS: 0 - Asymptomatic  Vitals:   05/01/17 0832  BP: 128/77  Pulse: 85  Resp: 18  Temp: 98.6 F (37 C)   Filed Weights   05/01/17 0832  Weight: 290 lb (131.5 kg)    GENERAL:alert, no distress and comfortable SKIN: skin color, texture, turgor are normal, no rashes or significant lesions EYES: normal, Conjunctiva are pink and non-injected, sclera clear OROPHARYNX:no exudate, no erythema and lips, buccal mucosa, and tongue normal  NECK: supple, thyroid normal size, non-tender, without nodularity  LYMPH:  no palpable lymphadenopathy in the cervical, axillary or inguinal LUNGS: clear to auscultation and percussion with normal breathing effort HEART: regular rate & rhythm and no murmurs and no lower extremity edema ABDOMEN:abdomen soft, non-tender and normal bowel sounds Musculoskeletal:no cyanosis of digits and no clubbing  NEURO: alert & oriented x 3 with fluent speech, no focal motor/sensory deficits  LABORATORY DATA:  I have  reviewed the data as listed    Component Value Date/Time   NA 143 10/28/2016 0836   K 4.9 10/28/2016 0836   CL 100 12/26/2014 0530   CO2 26 10/28/2016 0836   GLUCOSE 102 10/28/2016 0836   BUN 11.5 10/28/2016 0836   CREATININE 0.8 10/28/2016 0836   CALCIUM 9.5 10/28/2016 0836   PROT 7.2 10/28/2016 0836   ALBUMIN 3.6 10/28/2016 0836   AST 11 10/28/2016 0836   ALT 11 10/28/2016 0836   ALKPHOS 85 10/28/2016 0836   BILITOT 0.29 10/28/2016 0836   GFRNONAA >90 12/26/2014 0530   GFRAA >90 12/26/2014 0530    No results found for: SPEP, UPEP  Lab Results  Component Value Date   WBC 10.0 05/01/2017   NEUTROABS 6.9 (H) 05/01/2017   HGB 13.8 05/01/2017   HCT 41.7 05/01/2017   MCV 86.0 05/01/2017   PLT 359 05/01/2017      Chemistry      Component Value Date/Time   NA 143 10/28/2016 0836   K 4.9 10/28/2016 0836   CL 100 12/26/2014 0530   CO2 26 10/28/2016 0836   BUN 11.5 10/28/2016 0836   CREATININE 0.8 10/28/2016 0836      Component Value Date/Time   CALCIUM 9.5 10/28/2016 0836   ALKPHOS 85 10/28/2016 0836   AST 11 10/28/2016 0836   ALT 11 10/28/2016 0836   BILITOT 0.29 10/28/2016 0836      ASSESSMENT & PLAN:  History of B-cell lymphoma Her blood work is normal and she denies symptoms. She has no palpable lymphadenopathy on examination today. She has declined imaging study because of insurance issue. I think is reasonable not to do imaging study now Clinically, she has no signs or symptoms to suggest disease recurrence She is educated to watch out for signs and symptoms of cancer recurrence I will see her back in 6 months with repeat history, physical examination, blood work    No orders of the defined types were placed in this encounter.  All questions were answered. The patient knows to call the clinic with any problems, questions or concerns. No barriers to learning was detected. I spent 10 minutes counseling the patient face to face. The total time spent in the  appointment was 15 minutes and more than 50% was on counseling and review of test results     Heath Lark, MD 05/01/2017 9:02 AM

## 2017-11-02 ENCOUNTER — Other Ambulatory Visit: Payer: Self-pay | Admitting: Hematology and Oncology

## 2017-11-02 ENCOUNTER — Ambulatory Visit: Payer: Self-pay | Admitting: Hematology and Oncology

## 2017-11-02 ENCOUNTER — Other Ambulatory Visit: Payer: Self-pay

## 2017-11-02 DIAGNOSIS — Z8572 Personal history of non-Hodgkin lymphomas: Secondary | ICD-10-CM

## 2020-12-16 ENCOUNTER — Other Ambulatory Visit: Payer: Self-pay

## 2020-12-16 ENCOUNTER — Encounter: Payer: Self-pay | Admitting: Medical

## 2020-12-16 ENCOUNTER — Ambulatory Visit (INDEPENDENT_AMBULATORY_CARE_PROVIDER_SITE_OTHER): Payer: No Typology Code available for payment source | Admitting: Medical

## 2020-12-16 VITALS — BP 136/67 | HR 86 | Resp 20 | Ht 65.0 in | Wt 344.8 lb

## 2020-12-16 DIAGNOSIS — F419 Anxiety disorder, unspecified: Secondary | ICD-10-CM

## 2020-12-16 DIAGNOSIS — Z0000 Encounter for general adult medical examination without abnormal findings: Secondary | ICD-10-CM

## 2020-12-16 DIAGNOSIS — Z8572 Personal history of non-Hodgkin lymphomas: Secondary | ICD-10-CM

## 2020-12-16 DIAGNOSIS — F32A Depression, unspecified: Secondary | ICD-10-CM

## 2020-12-16 LAB — CBC WITH DIFFERENTIAL/PLATELET
Basophils Absolute: 0 10*3/uL (ref 0.0–0.1)
Basophils Relative: 0.4 % (ref 0.0–3.0)
Eosinophils Absolute: 0.2 10*3/uL (ref 0.0–0.7)
Eosinophils Relative: 1.4 % (ref 0.0–5.0)
HCT: 40.8 % (ref 36.0–46.0)
Hemoglobin: 13.3 g/dL (ref 12.0–15.0)
Lymphocytes Relative: 18.2 % (ref 12.0–46.0)
Lymphs Abs: 2.1 10*3/uL (ref 0.7–4.0)
MCHC: 32.6 g/dL (ref 30.0–36.0)
MCV: 84.2 fl (ref 78.0–100.0)
Monocytes Absolute: 0.4 10*3/uL (ref 0.1–1.0)
Monocytes Relative: 3.8 % (ref 3.0–12.0)
Neutro Abs: 9 10*3/uL — ABNORMAL HIGH (ref 1.4–7.7)
Neutrophils Relative %: 76.2 % (ref 43.0–77.0)
Platelets: 411 10*3/uL — ABNORMAL HIGH (ref 150.0–400.0)
RBC: 4.85 Mil/uL (ref 3.87–5.11)
RDW: 14.8 % (ref 11.5–15.5)
WBC: 11.8 10*3/uL — ABNORMAL HIGH (ref 4.0–10.5)

## 2020-12-16 LAB — LIPID PANEL
Cholesterol: 191 mg/dL (ref 0–200)
HDL: 45.8 mg/dL (ref >39.00)
LDL Cholesterol: 124 mg/dL — ABNORMAL HIGH (ref 0–99)
NonHDL: 145.25
Total CHOL/HDL Ratio: 4
Triglycerides: 108 mg/dL (ref 0.0–149.0)
VLDL: 21.6 mg/dL (ref 0.0–40.0)

## 2020-12-16 LAB — COMPREHENSIVE METABOLIC PANEL
ALT: 11 U/L (ref 0–35)
AST: 10 U/L (ref 0–37)
Albumin: 4 g/dL (ref 3.5–5.2)
Alkaline Phosphatase: 82 U/L (ref 39–117)
BUN: 8 mg/dL (ref 6–23)
CO2: 30 mEq/L (ref 19–32)
Calcium: 9.5 mg/dL (ref 8.4–10.5)
Chloride: 104 mEq/L (ref 96–112)
Creatinine, Ser: 0.77 mg/dL (ref 0.40–1.20)
GFR: 106.94 mL/min (ref >60.00)
Glucose, Bld: 114 mg/dL — ABNORMAL HIGH (ref 70–99)
Potassium: 4.3 mEq/L (ref 3.5–5.1)
Sodium: 139 mEq/L (ref 135–145)
Total Bilirubin: 0.3 mg/dL (ref 0.2–1.2)
Total Protein: 6.8 g/dL (ref 6.0–8.3)

## 2020-12-16 MED ORDER — VENLAFAXINE HCL ER 37.5 MG PO CP24: 37.5000 mg | ORAL_CAPSULE | Freq: Every day | ORAL | 0 refills | Status: DC

## 2020-12-16 NOTE — Patient Instructions (Addendum)
For you wellness exam today I have ordered cbc, cmp and and  lipid panel.  Flu vaccine declined. Thinking about covid vaccine.  Recommend exercise and healthy diet.  We will let you know lab results as they come in.  Follow up date 2 week or as needed  Refer to your former oncologist Dr. Tennis Ship.  For anxiety and depression rx effexor low dose. Discussed in event of thought of harm self or others then evaluation at [facility] long ED. If not responding to treatment consider psychiatrist referral. Rx advisement.   Preventive Care 60-50 Years Old, Female Preventive care refers to lifestyle choices and visits with your health care provider that can promote health and wellness. This includes:  A yearly physical exam. This is also called an annual wellness visit.  Regular dental and eye exams.  Immunizations.  Screening for certain conditions.  Healthy lifestyle choices, such as: ? Eating a healthy diet. ? Getting regular exercise. ? Not using drugs or products that contain nicotine and tobacco. ? Limiting alcohol use. What can I expect for my preventive care visit? Physical exam Your health care provider may check your:  Height and weight. These may be used to calculate your BMI (body mass index). BMI is a measurement that tells if you are at a healthy weight.  Heart rate and blood pressure.  Body temperature.  Skin for abnormal spots. Counseling Your health care provider may ask you questions about your:  Past medical problems.  Family's medical history.  Alcohol, tobacco, and drug use.  Emotional well-being.  Home life and relationship well-being.  Sexual activity.  Diet, exercise, and sleep habits.  Work and work Statistician.  Access to firearms.  Method of birth control.  Menstrual cycle.  Pregnancy history. What immunizations do I need? Vaccines are usually given at various ages, according to a schedule. Your health care provider will recommend  vaccines for you based on your age, medical history, and lifestyle or other factors, such as travel or where you work.   What tests do I need? Blood tests  Lipid and cholesterol levels. These may be checked every 5 years starting at age 24.  Hepatitis C test.  Hepatitis B test. Screening  Diabetes screening. This is done by checking your blood sugar (glucose) after you have not eaten for a while (fasting).  STD (sexually transmitted disease) testing, if you are at risk.  BRCA-related cancer screening. This may be done if you have a family history of breast, ovarian, tubal, or peritoneal cancers.  Pelvic exam and Pap test. This may be done every 3 years starting at age 50. Starting at age 17, this may be done every 5 years if you have a Pap test in combination with an HPV test. Talk with your health care provider about your test results, treatment options, and if necessary, the need for more tests.   Follow these instructions at home: Eating and drinking  Eat a healthy diet that includes fresh fruits and vegetables, whole grains, lean protein, and low-fat dairy products.  Take vitamin and mineral supplements as recommended by your health care provider.  Do not drink alcohol if: ? Your health care provider tells you not to drink. ? You are pregnant, may be pregnant, or are planning to become pregnant.  If you drink alcohol: ? Limit how much you have to 0-1 drink a day. ? Be aware of how much alcohol is in your drink. In the [place] one drink equals one 12 oz bottle  of beer (355 mL), one 5 oz glass of wine (148 mL), or one 1 oz glass of hard liquor (44 mL).   Lifestyle  Take daily care of your teeth and gums. Brush your teeth every morning and night with fluoride toothpaste. Floss one time each day.  Stay active. Exercise for at least 30 minutes 5 or more days each week.  Do not use any products that contain nicotine or tobacco, such as cigarettes, e-cigarettes, and chewing  tobacco. If you need help quitting, ask your health care provider.  Do not use drugs.  If you are sexually active, practice safe sex. Use a condom or other form of protection to prevent STIs (sexually transmitted infections).  If you do not wish to become pregnant, use a form of birth control. If you plan to become pregnant, see your health care provider for a prepregnancy visit.  Find healthy ways to cope with stress, such as: ? Meditation, yoga, or listening to music. ? Journaling. ? Talking to a trusted person. ? Spending time with friends and family. Safety  Always wear your seat belt while driving or riding in a vehicle.  Do not drive: ? If you have been drinking alcohol. Do not ride with someone who has been drinking. ? When you are tired or distracted. ? While texting.  Wear a helmet and other protective equipment during sports activities.  If you have firearms in your house, make sure you follow all gun safety procedures.  Seek help if you have been physically or sexually abused. What's next?  Go to your health care provider once a year for an annual wellness visit.  Ask your health care provider how often you should have your eyes and teeth checked.  Stay up to date on all vaccines. This information is not intended to replace advice given to you by your health care provider. Make sure you discuss any questions you have with your health care provider. Document Revised: 06/28/2020 Document Reviewed: 07/12/2018 Elsevier Patient Education  2021 [employer].

## 2020-12-16 NOTE — Progress Notes (Signed)
Subjective:    Patient ID: Lindsey Irwin, female    DOB: 1995/12/28, 25 y.o.   MRN: [id number]  HPI  Pt in for first time.  Pt works at [employer]. Pt does not exercise regularly. Diet not good. Eats a lot of fast foods. Drinks soda about 3-4 days a week. None smoker. Rare alcohol. Once every couple of months.   Hx of diffuse large  b cell  lymphoma. Treated for one year and finished. Pt was supposed to follow up annually but has not been evaluated in 3 years.  Anxiety and depression history. Depression is less than anxiety. Pt states in past medication was too expensive and she did not have insurance. PHQ-9 score 12. GAD 7 score 9.   Pt still considering getting covid vaccine.  Pt thinks had tetanus one year ago. She is not sure if was tdap.  Declines gyn referral.    Review of Systems  Constitutional: Negative for chills, fatigue and fever.  HENT: Negative for congestion and drooling.   Respiratory: Negative for cough, chest tightness, shortness of breath and wheezing.   Cardiovascular: Negative for chest pain and palpitations.  Gastrointestinal: Negative for abdominal pain, blood in stool, nausea and vomiting.  Genitourinary: Negative for difficulty urinating, dysuria, flank pain, frequency, hematuria, pelvic pain and urgency.  Musculoskeletal: Negative for back pain, joint swelling and myalgias.  Skin: Negative for rash.  Neurological: Negative for dizziness, seizures, light-headedness and headaches.  Hematological: Negative for adenopathy. Does not bruise/bleed easily.  Psychiatric/Behavioral: Positive for dysphoric mood. Negative for behavioral problems, decreased concentration, sleep disturbance and suicidal ideas. The patient is nervous/anxious.        No current suicide ideations. But notes several days thoughts better off dead. No active plans.    Past Medical History:  Diagnosis Date  . Breast mass 08/2014  . Cough 08/2014  . Diffuse large B cell lymphoma ([place])  09/02/2014  . Insomnia 09/24/2014  . Nasal congestion 08/2014  . Sore throat 08/25/2014     Social History   Socioeconomic History  . Marital status: Single    Spouse name: Not on file  . Number of children: Not on file  . Years of education: Not on file  . Highest education level: Not on file  Occupational History  . Not on file  Tobacco Use  . Smoking status: Never Smoker  . Smokeless tobacco: Never Used  Substance and Sexual Activity  . Alcohol use: No  . Drug use: No  . Sexual activity: Never  Other Topics Concern  . Not on file  Social History Narrative  . Not on file   Social Determinants of Health   Financial Resource Strain: Not on file  Food Insecurity: Not on file  Transportation Needs: Not on file  Physical Activity: Not on file  Stress: Not on file  Social Connections: Not on file  Intimate Partner Violence: Not on file    Past Surgical History:  Procedure Laterality Date  . BREAST BIOPSY Right 08/28/2014   Procedure: RIGHT EXCISIONAL BREAST BIOPSY;  Surgeon: Donnie Mesa, MD;  Location: [facility];  Service: General;  Laterality: Right;  . NO PAST SURGERIES      No family history on file.  Allergies  Allergen Reactions  . Other     NO BLOOD PRODUCTS. PT IS A JEHOVAH WITNESS.  Marland Kitchen Soap Itching    GAIN LAUNDRY DETERGENT Allergic to tide and surf also    No current outpatient medications on  file prior to visit.   No current facility-administered medications on file prior to visit.    BP 136/67   Pulse 86   Resp 20   Ht 5\' 5"  (1.651 m)   Wt (!) 344 lb 12.8 oz (156.4 kg)   LMP 11/12/2020   SpO2 97%   BMI 57.38 kg/m       Objective:   Physical Exam  General Mental Status- Alert. General Appearance- Not in acute distress.   Skin General: Color- Normal Color. Moisture- Normal Moisture.  Neck Carotid Arteries- Normal color. Moisture- Normal Moisture. No carotid bruits. No JVD.  Chest and Lung  Exam Auscultation: Breath Sounds:-Normal.  Cardiovascular Auscultation:Rythm- Regular. Murmurs & Other Heart Sounds:Auscultation of the heart reveals- No Murmurs.  Abdomen Inspection:-Inspeection Normal. Palpation/Percussion:Note:No mass. Palpation and Percussion of the abdomen reveal- Non Tender, Non Distended + BS, no rebound or guarding.   Neurologic Cranial Nerve exam:- CN III-XII intact(No nystagmus), symmetric smile. Strength:- 5/5 equal and symmetric strength both upper and lower extremities.      Assessment & Plan:  For you wellness exam today I have ordered cbc, cmp and  lipid panel  Flu vaccine declined. Thinking about covid vaccine.  Recommend exercise and healthy diet.  We will let you know lab results as they come in.  Follow up date 2 weeks or as needed,   Refer to your former oncologist Dr. Tennis Ship.  For anxiety and depression rx effexor low dose. Discussed in event of thought of harm self or others then evaluation at [facility] long ED. If not responding to treatment consider psychiatrist referral. Rx advisement.  Mackie Pai, PA-C

## 2021-01-11 ENCOUNTER — Other Ambulatory Visit: Payer: Self-pay | Admitting: Medical

## 2021-01-12 MED ORDER — VENLAFAXINE HCL ER 37.5 MG PO CP24: 37.5000 mg | ORAL_CAPSULE | Freq: Every day | ORAL | 1 refills | Status: DC

## 2021-04-01 ENCOUNTER — Telehealth: Payer: No Typology Code available for payment source | Admitting: Physician Assistant

## 2021-04-01 DIAGNOSIS — U071 COVID-19: Secondary | ICD-10-CM

## 2021-04-01 MED ORDER — BENZONATATE 100 MG PO CAPS: 100.0000 mg | ORAL_CAPSULE | Freq: Three times a day (TID) | ORAL | 0 refills | Status: DC | PRN

## 2021-04-01 MED ORDER — FLUTICASONE PROPIONATE 50 MCG/ACT NA SUSP: 2.0000 | Freq: Every day | NASAL | 0 refills | Status: DC

## 2021-04-01 NOTE — Progress Notes (Signed)
  E-Visit  for Positive Covid Test Result  We are sorry you are not feeling well. We are here to help!  You have tested positive for COVID-19, meaning that you were infected with the novel coronavirus and could give the virus to others.  It is vitally important that you stay home so you do not spread it to others.      Please continue isolation at home, for at least 10 days since the start of your symptoms and until you have had 24 hours with no fever (without taking a fever reducer) and with improving of symptoms.  If you have no symptoms but tested positive (or all symptoms resolve after 5 days and you have no fever) you can leave your house but continue to wear a mask around others for an additional 5 days. If you have a fever,continue to stay home until you have had 24 hours of no fever. Most cases improve 5-10 days from onset but we have seen a small number of patients who have gotten worse after the 10 days.  Please be sure to watch for worsening symptoms and remain taking the proper precautions.   Go to the nearest hospital ED for assessment if fever/cough/breathlessness are severe or illness seems like a threat to life.    The following symptoms may appear 2-14 days after exposure: Fever Cough Shortness of breath or difficulty breathing Chills Repeated shaking with chills Muscle pain Headache Sore throat New loss of taste or smell Fatigue Congestion or runny nose Nausea or vomiting Diarrhea  You have been enrolled in MyChart Home Monitoring for COVID-19. Daily you will receive a questionnaire within the MyChart website. Our COVID-19 response team will be monitoring your responses daily.  You can use medication such as prescription cough medication called Tessalon Perles 100 mg. You may take 1-2 capsules every 8 hours as needed for cough and prescription for Fluticasone nasal spray 2 sprays in each nostril one time per day  You may also take acetaminophen (Tylenol) as needed  for fever.  HOME CARE: Only take medications as instructed by your medical team. Drink plenty of fluids and get plenty of rest. A steam or ultrasonic humidifier can help if you have congestion.   GET HELP RIGHT AWAY IF YOU HAVE EMERGENCY WARNING SIGNS.  Call 911 or proceed to your closest emergency facility if: You develop worsening high fever. Trouble breathing Bluish lips or face Persistent pain or pressure in the chest New confusion Inability to wake or stay awake You cough up blood. Your symptoms become more severe Inability to hold down food or fluids  This list is not all possible symptoms. Contact your medical provider for any symptoms that are severe or concerning to you.    Your e-visit answers were reviewed by a board certified advanced clinical practitioner to complete your personal care plan.  Depending on the condition, your plan could have included both over the counter or prescription medications.  If there is a problem please reply once you have received a response from your provider.  Your safety is important to us.  If you have drug allergies check your prescription carefully.    You can use MyChart to ask questions about today's visit, request a non-urgent call back, or ask for a work or school excuse for 24 hours related to this e-Visit. If it has been greater than 24 hours you will need to follow up with your provider, or enter a new e-Visit to address those   concerns. You will get an e-mail in the next two days asking about your experience.  I hope that your e-visit has been valuable and will speed your recovery. Thank you for using e-visits.  I provided 5 minutes of non face-to-face time during this encounter for chart review and documentation.   

## 2023-05-22 ENCOUNTER — Ambulatory Visit: Payer: Managed Care, Other (non HMO) | Admitting: Medical

## 2023-05-22 ENCOUNTER — Ambulatory Visit: Payer: 59 | Admitting: Family Medicine

## 2023-05-22 VITALS — BP 104/67 | HR 88 | Temp 98.0°F | Resp 16 | Ht 65.0 in | Wt 293.2 lb

## 2023-05-22 DIAGNOSIS — F419 Anxiety disorder, unspecified: Secondary | ICD-10-CM | POA: Diagnosis not present

## 2023-05-22 DIAGNOSIS — R195 Other fecal abnormalities: Secondary | ICD-10-CM

## 2023-05-22 DIAGNOSIS — F32A Depression, unspecified: Secondary | ICD-10-CM

## 2023-05-22 MED ORDER — VENLAFAXINE HCL ER 37.5 MG PO CP24: 37.5000 mg | ORAL_CAPSULE | Freq: Every day | ORAL | 0 refills | Status: DC

## 2023-05-22 MED ORDER — HYOSCYAMINE SULFATE ER 0.375 MG PO TB12: 0.3750 mg | ORAL_TABLET | Freq: Two times a day (BID) | ORAL | 0 refills | Status: AC

## 2023-05-22 NOTE — Progress Notes (Signed)
Subjective:    Patient ID: Lindsey Irwin, female    DOB: 1996-03-25, 27 y.o.   MRN: [id number]  HPI  Discussed the use of AI scribe software for clinical note transcription with the patient, who gave verbal consent to proceed.  History of Present Illness   The patient has a longstanding history of gastrointestinal issues, characterized by frequent, loose bowel movements, often immediately following meals. This pattern has been present since childhood, but has worsened over the past couple of years. The patient reports having several bowel movements a day, sometimes even before eating. The stools are described as watery and the frequency of bowel movements has led to the patient planning around bathroom availability when going out. Despite these symptoms, the patient denies any recent fevers, chills, or sweats.  In addition to the gastrointestinal issues, the patient also reports a history of depression and anxiety, with recent worsening. She experiences suicidal thoughts approximately once a week, which can persist for a couple of days. This pattern has been ongoing for several years, but there have been no suicide attempts. Previous trials of Wellbutrin and Effexor at 37.5 mg did not provide noticeable relief.  The patient also recently started taking birth control in April, but plans to discontinue it. She has not started any other new medications. She has attempted to manage her gastrointestinal symptoms by increasing fiber intake, and recently ordered pre and post biotics, but has not yet started taking them.        Lmp-  June 17 , 2024. Was on otc ocp for 2 months. She states she got off the ocp since she thought her mood was worse.   Review of Systems  Constitutional:  Negative for chills, fatigue and fever.  Respiratory:  Negative for cough, chest tightness, shortness of breath and wheezing.   Cardiovascular:  Negative for chest pain and palpitations.  Gastrointestinal:  Negative for  abdominal pain, anal bleeding and blood in stool.  Neurological:  Negative for dizziness, numbness and headaches.       Objective:   Physical Exam   General Mental Status- Alert. General Appearance- Not in acute distress.   Skin General: Color- Normal Color. Moisture- Normal Moisture.  Neck Carotid Arteries- Normal color. Moisture- Normal Moisture. No carotid bruits. No JVD.  Chest and Lung Exam Auscultation: Breath Sounds:-Normal.  Cardiovascular Auscultation:Rythm- Regular. Murmurs & Other Heart Sounds:Auscultation of the heart reveals- No Murmurs.  Abdomen Inspection:-Inspeection Normal. Palpation/Percussion:Note:No mass. Palpation and Percussion of the abdomen reveal- Non Tender, Non Distended + BS, no rebound or guarding.    Neurologic Cranial Nerve exam:- CN III-XII intact(No nystagmus), symmetric smile. Drift Test:- No drift. Romberg Exam:- Negative.  Heal to Toe Gait exam:-Normal. Finger to Nose:- Normal/Intact Strength:- 5/5 equal and symmetric strength both upper and lower extremities.      Assessment & Plan:  Assessment and Plan    Irritable Bowel Syndrome (IBS): Chronic loose stools and abdominal cramping, worsened over the past couple of years. No recent antibiotic use. Likely IBS-Diarrhea subtype. -Order stool culture, ova and parasites, and C. diff test. -Start Levbid after stool samples are submitted. -Consider referral to gastroenterology if no response to Levbid. -Increase dietary fiber intake.  Depression and Anxiety: Chronic depression and anxiety with weekly suicidal ideation, no prior suicide attempts. Previous trials of Wellbutrin and Effexor at low doses with minimal effect. -Start Effexor 37.5mg  daily, with plan to increase dose after 2 weeks if tolerated. -Follow-up in 2 weeks to assess response and adjust medication as  needed. -Provide patient with contact information for behavioral health urgent care and instruct to go to [facility]  emergency department if severe or persistent suicidal thoughts occur.        Follow up in 2 weeks or sooner if needed.   I got to pt room by around 4:30. Was not aware she wanted wellness exam. On schedule stated lab work, gi issues and mood.  I thought initially was associated with gastro issues. Labs was closed by the time she expressed wanted wellness exam.

## 2023-05-22 NOTE — Patient Instructions (Addendum)
Irritable Bowel Syndrome (IBS): Chronic loose stools and abdominal cramping, worsened over the past couple of years. No recent antibiotic use. Likely IBS-Diarrhea subtype. -Order stool culture, ova and parasites, and C. diff test. -Start Levbid after stool samples are submitted. -Consider referral to gastroenterology if no response to Levbid. -Increase dietary fiber intake.  Depression and Anxiety: Chronic depression and anxiety with weekly suicidal ideation, no prior suicide attempts. Previous trials of Wellbutrin and Effexor at low doses with minimal effect. -Start Effexor 37.5mg  daily, with plan to increase dose after 2 weeks if tolerated. -Follow-up in 2 weeks to assess response and adjust medication as needed. -Provide patient with contact information for behavioral health urgent care and instruct to go to [facility] emergency department if severe or persistent suicidal thoughts occur.  Follow up in 2 weeks or sooner if needed.

## 2023-06-01 ENCOUNTER — Other Ambulatory Visit: Payer: Managed Care, Other (non HMO)

## 2023-06-01 DIAGNOSIS — R195 Other fecal abnormalities: Secondary | ICD-10-CM

## 2023-06-01 NOTE — Progress Notes (Signed)
Specimen drop off

## 2023-06-02 LAB — OVA AND PARASITE EXAMINATION
CONCENTRATE RESULT:: NONE SEEN
MICRO NUMBER:: 15217699
SPECIMEN QUALITY:: ADEQUATE
TRICHROME RESULT:: NONE SEEN

## 2023-06-02 LAB — CLOSTRIDIUM DIFFICILE BY PCR: Toxigenic C. Difficile by PCR: NEGATIVE

## 2023-06-03 LAB — STOOL CULTURE: E coli, Shiga toxin Assay: NEGATIVE

## 2023-06-04 LAB — STOOL CULTURE: E coli, Shiga toxin Assay: NEGATIVE

## 2023-06-05 ENCOUNTER — Ambulatory Visit: Payer: Managed Care, Other (non HMO) | Admitting: Medical

## 2023-06-05 VITALS — BP 108/66 | HR 60 | Resp 18 | Ht 65.0 in | Wt 290.0 lb

## 2023-06-05 DIAGNOSIS — Z0000 Encounter for general adult medical examination without abnormal findings: Secondary | ICD-10-CM | POA: Diagnosis not present

## 2023-06-05 DIAGNOSIS — F32A Depression, unspecified: Secondary | ICD-10-CM | POA: Diagnosis not present

## 2023-06-05 DIAGNOSIS — C8330 Diffuse large B-cell lymphoma, unspecified site: Secondary | ICD-10-CM

## 2023-06-05 DIAGNOSIS — R195 Other fecal abnormalities: Secondary | ICD-10-CM | POA: Diagnosis not present

## 2023-06-05 DIAGNOSIS — F419 Anxiety disorder, unspecified: Secondary | ICD-10-CM

## 2023-06-05 DIAGNOSIS — R5383 Other fatigue: Secondary | ICD-10-CM | POA: Diagnosis not present

## 2023-06-05 LAB — CBC WITH DIFFERENTIAL/PLATELET
Basophils Absolute: 0.1 10*3/uL (ref 0.0–0.1)
Basophils Relative: 0.6 % (ref 0.0–3.0)
Eosinophils Absolute: 0.1 10*3/uL (ref 0.0–0.7)
Eosinophils Relative: 0.7 % (ref 0.0–5.0)
HCT: 42.3 % (ref 36.0–46.0)
Hemoglobin: 13.4 g/dL (ref 12.0–15.0)
Lymphocytes Relative: 19.1 % (ref 12.0–46.0)
Lymphs Abs: 2.2 10*3/uL (ref 0.7–4.0)
MCHC: 31.8 g/dL (ref 30.0–36.0)
MCV: 87.9 fl (ref 78.0–100.0)
Monocytes Absolute: 0.3 10*3/uL (ref 0.1–1.0)
Monocytes Relative: 2.4 % — ABNORMAL LOW (ref 3.0–12.0)
Neutro Abs: 9 10*3/uL — ABNORMAL HIGH (ref 1.4–7.7)
Neutrophils Relative %: 77.2 % — ABNORMAL HIGH (ref 43.0–77.0)
Platelets: 395 10*3/uL (ref 150.0–400.0)
RBC: 4.81 Mil/uL (ref 3.87–5.11)
RDW: 14.7 % (ref 11.5–15.5)
WBC: 11.7 10*3/uL — ABNORMAL HIGH (ref 4.0–10.5)

## 2023-06-05 LAB — COMPREHENSIVE METABOLIC PANEL
ALT: 10 U/L (ref 0–35)
AST: 11 U/L (ref 0–37)
Albumin: 4.1 g/dL (ref 3.5–5.2)
Alkaline Phosphatase: 64 U/L (ref 39–117)
BUN: 11 mg/dL (ref 6–23)
CO2: 27 mEq/L (ref 19–32)
Calcium: 9.2 mg/dL (ref 8.4–10.5)
Chloride: 104 mEq/L (ref 96–112)
Creatinine, Ser: 0.85 mg/dL (ref 0.40–1.20)
GFR: 93.35 mL/min (ref >60.00)
Glucose, Bld: 80 mg/dL (ref 70–99)
Potassium: 4.3 mEq/L (ref 3.5–5.1)
Sodium: 140 mEq/L (ref 135–145)
Total Bilirubin: 0.3 mg/dL (ref 0.2–1.2)
Total Protein: 6.7 g/dL (ref 6.0–8.3)

## 2023-06-05 LAB — TSH: TSH: 0.73 u[IU]/mL (ref 0.35–5.50)

## 2023-06-05 LAB — LIPID PANEL
Cholesterol: 180 mg/dL (ref 0–200)
HDL: 51.4 mg/dL (ref >39.00)
LDL Cholesterol: 115 mg/dL — ABNORMAL HIGH (ref 0–99)
NonHDL: 128.78
Total CHOL/HDL Ratio: 4
Triglycerides: 70 mg/dL (ref 0.0–149.0)
VLDL: 14 mg/dL (ref 0.0–40.0)

## 2023-06-05 LAB — T4, FREE: Free T4: 0.99 ng/dL (ref 0.60–1.60)

## 2023-06-05 LAB — VITAMIN B12: Vitamin B-12: 402 pg/mL (ref 211–911)

## 2023-06-05 LAB — IRON: Iron: 37 ug/dL — ABNORMAL LOW (ref 42–145)

## 2023-06-05 MED ORDER — VENLAFAXINE HCL ER 37.5 MG PO CP24: 37.5000 mg | ORAL_CAPSULE | Freq: Every day | ORAL | 0 refills | Status: DC

## 2023-06-05 NOTE — Progress Notes (Signed)
Subjective:    Patient ID: Lindsey Irwin, female    DOB: 03-Jul-1996, 27 y.o.   MRN: [id number]  HPI  Pt in for wellness exam. She is fasting.  She works at [employer]. Pt has been exercising about 1 hour 5 days a week. She states has been eating healthy. However drinks soda 3-4 times a week. States zero sugar types. Non smoker. Very rare alcohol use.   Pt declines- Tdap.   Pt never had papsmear.    Pt also came in last time for Depression and anxiety. Last hpi was below  "Depression and Anxiety: Chronic depression and anxiety with weekly suicidal ideation, no prior suicide attempts. Previous trials of Wellbutrin and Effexor at low doses with minimal effect. -Start Effexor 37.5mg  daily, with plan to increase dose after 2 weeks if tolerated. -Follow-up in 2 weeks to assess response and adjust medication as needed. -Provide patient with contact information for behavioral health urgent care and instruct to go to [facility] emergency department if severe or persistent suicidal thoughts occur."   Pt feels despite effexor feels the same.  Also follow up on probable ibs. Below in " last A/P for that.  "Irritable Bowel Syndrome (IBS): Chronic loose stools and abdominal cramping, worsened over the past couple of years. No recent antibiotic use. Likely IBS-Diarrhea subtype. -Order stool culture, ova and parasites, and C. diff test. -Start Levbid after stool samples are submitted. -Consider referral to gastroenterology if no response to Levbid. -Increase dietary fiber intake."  So far her stool studies all negative but campylo bacter was pending. Pt had not tried.     Review of Systems  Constitutional:  Positive for fatigue.  Respiratory:  Negative for choking, shortness of breath and wheezing.   Cardiovascular:  Negative for chest pain and palpitations.  Gastrointestinal:  Negative for abdominal pain, diarrhea and nausea.  Musculoskeletal:  Negative for back pain and myalgias.   Neurological:  Negative for dizziness, seizures, syncope, weakness and headaches.  Hematological:  Negative for adenopathy. Does not bruise/bleed easily.  Psychiatric/Behavioral:  Positive for dysphoric mood and suicidal ideas. The patient is nervous/anxious.        Still has transient thought as before but no formal plan.     Past Medical History:  Diagnosis Date   Breast mass 08/2014   Cough 08/2014   Diffuse large B cell lymphoma (HCC) 09/02/2014   Insomnia 09/24/2014   Nasal congestion 08/2014   Sore throat 08/25/2014     Social History   Socioeconomic History   Marital status: Single    Spouse name: Not on file   Number of children: Not on file   Years of education: Not on file   Highest education level: Associate degree: occupational, Scientist, product/process development, or vocational program  Occupational History   Not on file  Tobacco Use   Smoking status: Never   Smokeless tobacco: Never  Substance and Sexual Activity   Alcohol use: No   Drug use: No   Sexual activity: Never  Other Topics Concern   Not on file  Social History Narrative   Not on file   Social Determinants of Health   Financial Resource Strain: Low Risk  (05/17/2023)   Overall Financial Resource Strain (CARDIA)    Difficulty of Paying Living Expenses: Not very hard  Food Insecurity: No Food Insecurity (05/17/2023)   Hunger Vital Sign    Worried About Running Out of Food in the Last Year: Never true    Ran Out of Food  in the Last Year: Never true  Transportation Needs: No Transportation Needs (05/17/2023)   PRAPARE - Administrator, Civil Service (Medical): No    Lack of Transportation (Non-Medical): No  Physical Activity: Insufficiently Active (05/17/2023)   Exercise Vital Sign    Days of Exercise per Week: 3 days    Minutes of Exercise per Session: 40 min  Stress: Stress Concern Present (05/17/2023)   [employer] of Occupational Health - Occupational Stress Questionnaire    Feeling of Stress : Very  much  Social Connections: Unknown (05/17/2023)   Social Connection and Isolation Panel [NHANES]    Frequency of Communication with Friends and Family: Patient declined    Frequency of Social Gatherings with Friends and Family: Patient declined    Attends Religious Services: More than 4 times per year    Active Member of Clubs or Organizations: No    Attends Engineer, structural: Not on file    Marital Status: Never married  Intimate Partner Violence: Not on file    Past Surgical History:  Procedure Laterality Date   BREAST BIOPSY Right 08/28/2014   Procedure: RIGHT EXCISIONAL BREAST BIOPSY;  Surgeon: Manus Rudd, MD;  Location: [facility];  Service: General;  Laterality: Right;   NO PAST SURGERIES      No family history on file.  Allergies  Allergen Reactions   Other     NO BLOOD PRODUCTS. PT IS A JEHOVAH WITNESS.   Soap Itching    GAIN LAUNDRY DETERGENT Allergic to tide and surf also    Current Outpatient Medications on File Prior to Visit  Medication Sig Dispense Refill   hyoscyamine (LEVBID) 0.375 MG 12 hr tablet Take 1 tablet (0.375 mg total) by mouth 2 (two) times daily. 60 tablet 0   venlafaxine XR (EFFEXOR XR) 37.5 MG 24 hr capsule Take 1 capsule (37.5 mg total) by mouth daily with breakfast. 30 capsule 0   No current facility-administered medications on file prior to visit.    BP 108/66   Pulse 60   Resp 18   Ht 5\' 5"  (1.651 m)   Wt 290 lb (131.5 kg)   LMP 06/03/2023   SpO2 100%   BMI 48.26 kg/m        Objective:   Physical Exam  General Mental Status- Alert. General Appearance- Not in acute distress.   Skin General: Color- Normal Color. Moisture- Normal Moisture.  Neck Carotid Arteries- Normal color. Moisture- Normal Moisture. No carotid bruits. No JVD.  Chest and Lung Exam Auscultation: Breath Sounds:-Normal.  Cardiovascular Auscultation:Rythm- Regular. Murmurs & Other Heart Sounds:Auscultation of the heart reveals-  No Murmurs.  Abdomen Inspection:-Inspeection Normal. Palpation/Percussion:Note:No mass. Palpation and Percussion of the abdomen reveal- Non Tender, Non Distended + BS, no rebound or guarding.    Neurologic Cranial Nerve exam:- CN III-XII intact(No nystagmus), symmetric smile. Drift Test:- No drift. Romberg Exam:- Negative.  Heal to Toe Gait exam:-Normal. Finger to Nose:- Normal/Intact Strength:- 5/5 equal and symmetric strength both upper and lower extremities.       Assessment & Plan:   Patient Instructions  For you wellness exam today I have ordered cbc, cmp and  lipid panel.  For fatigue adding tsh, t4, b12 and iron level.  Declines tdap today.  Recommend exercise and healthy diet.  We will let you know lab results as they come in.  Follow up date appointment will be determined after lab review.    For loose stools/probable ibs due recommend that  your try levbid and udate me in 2 weeks if helping.  Severe depression and anxiety Rx of low does effexor. Continue rx advisement given. If you feel making mood worse then stop. Will go ahead and refer to behavioral health. Gave list of specialist. Please contact and let me know by this Thursday who you have chosen. Then will place referral . Same advise as before.  Provide behavioral health urgent care last visit  and instruct to go to [facility] emergency department if severe or persistent suicidal thoughts occur.   If delay in getting in with specialist then want to see you in 3 weeks or sooner if needed.  Discussed will review cbc and refer you back to hematologist.     Esperanza Richters, PA-C   [phone] charge as well today. Address ibs, depression, anxiety and fatigue. After cbc review will be placing referral to hematogist as well.

## 2023-06-05 NOTE — Patient Instructions (Addendum)
For you wellness exam today I have ordered cbc, cmp and lipid panel.  For fatigue adding tsh, t4, b12 and iron level.  Declines tdap today.  Recommend exercise and healthy diet.  We will let you know lab results as they come in.  Follow up date appointment will be determined after lab review.    For loose stools/probable ibs due recommend that your try levbid and udate me in 2 weeks if helping.  Severe depression and anxiety Rx of low does effexor. Continue rx advisement given. If you feel making mood worse then stop. Will go ahead and refer to behavioral health. Gave list of specialist. Please contact and let me know by this Thursday who you have chosen. Then will place referral . Same advise as before.  Provide behavioral health urgent care last visit  and instruct to go to [facility] emergency department if severe or persistent suicidal thoughts occur.   If delay in getting in with specialist then want to see you in 3 weeks or sooner if needed.  Discussed will review cbc and refer you back to hematologist.  Preventive Care 77-38 Years Old, Female Preventive care refers to lifestyle choices and visits with your health care provider that can promote health and wellness. Preventive care visits are also called wellness exams. What can I expect for my preventive care visit? Counseling During your preventive care visit, your health care provider may ask about your: Medical history, including: Past medical problems. Family medical history. Pregnancy history. Current health, including: Menstrual cycle. Method of birth control. Emotional well-being. Home life and relationship well-being. Sexual activity and sexual health. Lifestyle, including: Alcohol, nicotine or tobacco, and drug use. Access to firearms. Diet, exercise, and sleep habits. Work and work Astronomer. Sunscreen use. Safety issues such as seatbelt and bike helmet use. Physical exam Your health care provider may  check your: Height and weight. These may be used to calculate your BMI (body mass index). BMI is a measurement that tells if you are at a healthy weight. Waist circumference. This measures the distance around your waistline. This measurement also tells if you are at a healthy weight and may help predict your risk of certain diseases, such as type 2 diabetes and high blood pressure. Heart rate and blood pressure. Body temperature. Skin for abnormal spots. What immunizations do I need?  Vaccines are usually given at various ages, according to a schedule. Your health care provider will recommend vaccines for you based on your age, medical history, and lifestyle or other factors, such as travel or where you work. What tests do I need? Screening Your health care provider may recommend screening tests for certain conditions. This may include: Pelvic exam and Pap test. Lipid and cholesterol levels. Diabetes screening. This is done by checking your blood sugar (glucose) after you have not eaten for a while (fasting). Hepatitis B test. Hepatitis C test. HIV (human immunodeficiency virus) test. STI (sexually transmitted infection) testing, if you are at risk. BRCA-related cancer screening. This may be done if you have a family history of breast, ovarian, tubal, or peritoneal cancers. Talk with your health care provider about your test results, treatment options, and if necessary, the need for more tests. Follow these instructions at home: Eating and drinking  Eat a healthy diet that includes fresh fruits and vegetables, whole grains, lean protein, and low-fat dairy products. Take vitamin and mineral supplements as recommended by your health care provider. Do not drink alcohol if: Your health care provider tells  you not to drink. You are pregnant, may be pregnant, or are planning to become pregnant. If you drink alcohol: Limit how much you have to 0-1 drink a day. Know how much alcohol is in your  drink. In the [place] one drink equals one 12 oz bottle of beer (355 mL), one 5 oz glass of wine (148 mL), or one 1 oz glass of hard liquor (44 mL). Lifestyle Brush your teeth every morning and night with fluoride toothpaste. Floss one time each day. Exercise for at least 30 minutes 5 or more days each week. Do not use any products that contain nicotine or tobacco. These products include cigarettes, chewing tobacco, and vaping devices, such as e-cigarettes. If you need help quitting, ask your health care provider. Do not use drugs. If you are sexually active, practice safe sex. Use a condom or other form of protection to prevent STIs. If you do not wish to become pregnant, use a form of birth control. If you plan to become pregnant, see your health care provider for a prepregnancy visit. Find healthy ways to manage stress, such as: Meditation, yoga, or listening to music. Journaling. Talking to a trusted person. Spending time with friends and family. Minimize exposure to UV radiation to reduce your risk of skin cancer. Safety Always wear your seat belt while driving or riding in a vehicle. Do not drive: If you have been drinking alcohol. Do not ride with someone who has been drinking. If you have been using any mind-altering substances or drugs. While texting. When you are tired or distracted. Wear a helmet and other protective equipment during sports activities. If you have firearms in your house, make sure you follow all gun safety procedures. Seek help if you have been physically or sexually abused. What's next? Go to your health care provider once a year for an annual wellness visit. Ask your health care provider how often you should have your eyes and teeth checked. Stay up to date on all vaccines. This information is not intended to replace advice given to you by your health care provider. Make sure you discuss any questions you have with your health care provider. Document Revised:  04/28/2021 Document Reviewed: 04/28/2021 Elsevier Patient Education  2024 [employer].

## 2023-06-06 ENCOUNTER — Encounter: Payer: Self-pay | Admitting: Medical

## 2023-06-06 LAB — STOOL CULTURE

## 2023-06-06 MED ORDER — IRON (FERROUS SULFATE) 325 (65 FE) MG PO TABS: ORAL_TABLET | ORAL | 1 refills | Status: AC

## 2023-06-06 NOTE — Addendum Note (Signed)
Addended by: Gwenevere Abbot on: 06/06/2023 06:37 AM   Modules accepted: Orders

## 2023-06-06 NOTE — Addendum Note (Signed)
Addended by: Gwenevere Abbot on: 06/06/2023 09:13 PM   Modules accepted: Orders

## 2023-06-08 NOTE — Addendum Note (Signed)
Addended by: Gwenevere Abbot on: 06/08/2023 04:18 PM   Modules accepted: Orders

## 2023-07-03 ENCOUNTER — Encounter: Payer: Self-pay | Admitting: Hematology and Oncology

## 2023-07-03 ENCOUNTER — Other Ambulatory Visit: Payer: Self-pay

## 2023-07-03 ENCOUNTER — Inpatient Hospital Stay: Payer: Managed Care, Other (non HMO)

## 2023-07-03 ENCOUNTER — Inpatient Hospital Stay: Payer: Managed Care, Other (non HMO) | Attending: Hematology and Oncology | Admitting: Hematology and Oncology

## 2023-07-03 VITALS — BP 130/82 | HR 78 | Temp 98.2°F | Resp 18 | Ht 65.0 in | Wt 296.0 lb

## 2023-07-03 DIAGNOSIS — Z79899 Other long term (current) drug therapy: Secondary | ICD-10-CM | POA: Diagnosis not present

## 2023-07-03 DIAGNOSIS — Z809 Family history of malignant neoplasm, unspecified: Secondary | ICD-10-CM

## 2023-07-03 DIAGNOSIS — D72829 Elevated white blood cell count, unspecified: Secondary | ICD-10-CM | POA: Insufficient documentation

## 2023-07-03 DIAGNOSIS — D72825 Bandemia: Secondary | ICD-10-CM

## 2023-07-03 DIAGNOSIS — Z8572 Personal history of non-Hodgkin lymphomas: Secondary | ICD-10-CM | POA: Insufficient documentation

## 2023-07-03 DIAGNOSIS — Z803 Family history of malignant neoplasm of breast: Secondary | ICD-10-CM | POA: Diagnosis not present

## 2023-07-03 NOTE — Assessment & Plan Note (Signed)
Her examination is benign.  She does not need long-term follow-up.  She does not need long-term surveillance imaging.  The patient has my contact details to call if questions arise

## 2023-07-03 NOTE — Assessment & Plan Note (Signed)
Her mother was diagnosed with breast cancer at a young age.  I recommend the patient to start screening mammogram 10 years before her mother's age of diagnosis or if the patient have new symptoms

## 2023-07-03 NOTE — Assessment & Plan Note (Signed)
She has history of intermittent leukocytosis.  The cause is unknown but could be associated with class III obesity She does not need long-term follow-up

## 2023-07-03 NOTE — Progress Notes (Signed)
[facility] FOLLOW-UP progress notes  Patient Care Team: Saguier, Kateri Mc as PCP - General (Internal Medicine)  CHIEF COMPLAINTS/PURPOSE OF VISIT:  History of lymphoma, for further evaluation  HISTORY OF PRESENTING ILLNESS:  Lindsey Irwin 27 y.o. female is referred back to me due to history of cancer. The patient was seen and treated from 2015 to 2016.  She was last seen in 2018.  She feels healthy.  She denies new lymphadenopathy.  Denies abnormal weight loss or night sweats. She denies any recent abnormal breast examination, palpable mass, abnormal breast appearance or nipple changes Her mother was diagnosed with breast cancer at the age of 19 but negative for genetics  I reviewed the patient's records extensive and collaborated the history with the patient. Summary of her history is as follows: Oncology History Overview Note  Diffuse large B cell lymphoma   Primary site: Lymphoid Neoplasms   Staging method: AJCC 6th Edition   Clinical: Stage IV signed by Artis Delay, MD on 09/03/2014  8:18 PM   Summary: Stage IV IPI of 3: high LDH, >2 extranodal disease and stage IV at presentation     History of B-cell lymphoma  07/30/2014 Imaging   She had a bilateral mammogram which came back abnormal. Ultrasound-guided biopsy was performed, suspicious for lymphoma   08/25/2014 Imaging   PET/CT scan showed diffuse metastatic cancer involving bilateral breasts, bone, and large abdominal mass   08/28/2014 Surgery   She had excisional surgery and removal of right breast mass   08/28/2014 Pathology Results   Accession: [id number] right breast mass is consistent with diffuse large B-cell lymphoma with high proliferation index.   09/04/2014 - 12/26/2014 Hospital Admission   she ad 6 cycles of rituximab-EPOCH plus 4 cycles of CNS prophylaxis with IT MTX   09/04/2014 Imaging   echocardiogram is normal.   09/04/2014 Procedure   she has placement of PICC line.   10/14/2014  Imaging   PET/CT scan showed near complete response to treatment.   12/17/2014 Imaging   Repeat PET CT scan showed near complete response to treatment.   03/20/2015 Imaging   CT chest, abdomen and pelvis showed no residual lymphoma but presence of bilateral enlarged ovaries.     MEDICAL HISTORY:  Past Medical History:  Diagnosis Date   Breast mass 08/2014   Cough 08/2014   Diffuse large B cell lymphoma (HCC) 09/02/2014   Insomnia 09/24/2014   Nasal congestion 08/2014   Sore throat 08/25/2014    SURGICAL HISTORY: Past Surgical History:  Procedure Laterality Date   BREAST BIOPSY Right 08/28/2014   Procedure: RIGHT EXCISIONAL BREAST BIOPSY;  Surgeon: Manus Rudd, MD;  Location: [facility];  Service: General;  Laterality: Right;   NO PAST SURGERIES      SOCIAL HISTORY: Social History   Socioeconomic History   Marital status: Single    Spouse name: Not on file   Number of children: Not on file   Years of education: Not on file   Highest education level: Associate degree: occupational, Scientist, product/process development, or vocational program  Occupational History   Occupation: Data processing manager  Tobacco Use   Smoking status: Never   Smokeless tobacco: Never  Substance and Sexual Activity   Alcohol use: No   Drug use: No   Sexual activity: Never  Other Topics Concern   Not on file  Social History Narrative   Not on file   Social Determinants of Health   Financial Resource Strain: Low Risk  (05/17/2023)  Overall Financial Resource Strain (CARDIA)    Difficulty of Paying Living Expenses: Not very hard  Food Insecurity: No Food Insecurity (05/17/2023)   Hunger Vital Sign    Worried About Running Out of Food in the Last Year: Never true    Ran Out of Food in the Last Year: Never true  Transportation Needs: No Transportation Needs (05/17/2023)   PRAPARE - Administrator, Civil Service (Medical): No    Lack of Transportation (Non-Medical): No  Physical Activity:  Insufficiently Active (05/17/2023)   Exercise Vital Sign    Days of Exercise per Week: 3 days    Minutes of Exercise per Session: 40 min  Stress: Stress Concern Present (05/17/2023)   [employer] of Occupational Health - Occupational Stress Questionnaire    Feeling of Stress : Very much  Social Connections: Unknown (05/17/2023)   Social Connection and Isolation Panel [NHANES]    Frequency of Communication with Friends and Family: Patient declined    Frequency of Social Gatherings with Friends and Family: Patient declined    Attends Religious Services: More than 4 times per year    Active Member of [employer] or Organizations: No    Attends Engineer, structural: Not on file    Marital Status: Never married  Catering manager Violence: Not on file    FAMILY HISTORY: Family History  Problem Relation Age of Onset   Cancer Mother 73       breast ca    ALLERGIES:  is allergic to other and soap.  MEDICATIONS:  Current Outpatient Medications  Medication Sig Dispense Refill   sertraline (ZOLOFT) 25 MG tablet Take 25 mg by mouth daily.     hyoscyamine (LEVBID) 0.375 MG 12 hr tablet Take 1 tablet (0.375 mg total) by mouth 2 (two) times daily. 60 tablet 0   Iron, Ferrous Sulfate, 325 (65 Fe) MG TABS 1 tab po every other day. 15 tablet 1   No current facility-administered medications for this visit.    REVIEW OF SYSTEMS:   Constitutional: Denies fevers, chills or abnormal night sweats Eyes: Denies blurriness of vision, double vision or watery eyes Ears, nose, mouth, throat, and face: Denies mucositis or sore throat Respiratory: Denies cough, dyspnea or wheezes Cardiovascular: Denies palpitation, chest discomfort or lower extremity swelling Gastrointestinal:  Denies nausea, heartburn or change in bowel habits Skin: Denies abnormal skin rashes Lymphatics: Denies new lymphadenopathy or easy bruising Neurological:Denies numbness, tingling or new weaknesses Behavioral/Psych: Mood is  stable, no new changes  All other systems were reviewed with the patient and are negative.  PHYSICAL EXAMINATION: ECOG PERFORMANCE STATUS: 0 - Asymptomatic  Vitals:   07/03/23 0906  BP: 130/82  Pulse: 78  Resp: 18  Temp: 98.2 F (36.8 C)  SpO2: 99%   Filed Weights   07/03/23 0906  Weight: 296 lb (134.3 kg)    GENERAL:alert, no distress and comfortable SKIN: skin color, texture, turgor are normal, no rashes or significant lesions EYES: normal, conjunctiva are pink and non-injected, sclera clear OROPHARYNX:no exudate, normal lips, buccal mucosa, and tongue  NECK: supple, thyroid normal size, non-tender, without nodularity LYMPH:  no palpable lymphadenopathy in the cervical, axillary or inguinal LUNGS: clear to auscultation and percussion with normal breathing effort HEART: regular rate & rhythm and no murmurs without lower extremity edema ABDOMEN:abdomen soft, non-tender and normal bowel sounds Musculoskeletal:no cyanosis of digits and no clubbing  PSYCH: alert & oriented x 3 with fluent speech NEURO: no focal motor/sensory deficits  LABORATORY DATA:  I have reviewed the data as listed Lab Results  Component Value Date   WBC 11.7 (H) 06/05/2023   HGB 13.4 06/05/2023   HCT 42.3 06/05/2023   MCV 87.9 06/05/2023   PLT 395.0 06/05/2023   Recent Labs    06/05/23 1200  NA 140  K 4.3  CL 104  CO2 27  GLUCOSE 80  BUN 11  CREATININE 0.85  CALCIUM 9.2  PROT 6.7  ALBUMIN 4.1  AST 11  ALT 10  ALKPHOS 64  BILITOT 0.3    ASSESSMENT & PLAN:  History of B-cell lymphoma Her examination is benign.  She does not need long-term follow-up.  She does not need long-term surveillance imaging.  The patient has my contact details to call if questions arise  Leukocytosis She has history of intermittent leukocytosis.  The cause is unknown but could be associated with class III obesity She does not need long-term follow-up  Family history of cancer in mother Her mother was  diagnosed with breast cancer at a young age.  I recommend the patient to start screening mammogram 10 years before her mother's age of diagnosis or if the patient have new symptoms  No orders of the defined types were placed in this encounter.   All questions were answered. The patient knows to call the clinic with any problems, questions or concerns. The total time spent in the appointment was 55 minutes encounter with patients including review of chart and various tests results, discussions about plan of care and coordination of care plan   Artis Delay, MD 07/03/2023 9:27 AM

## 2024-04-17 ENCOUNTER — Encounter: Admitting: Medical
